# Patient Record
Sex: Female | Born: 1965 | Race: Black or African American | Hispanic: No | Marital: Married | Smoking: Never smoker
Health system: Southern US, Community
[De-identification: ages and names within clinical notes are randomized; demographics above are authoritative.]

## PROBLEM LIST (undated history)

## (undated) DIAGNOSIS — R011 Cardiac murmur, unspecified: Secondary | ICD-10-CM

## (undated) DIAGNOSIS — Z9221 Personal history of antineoplastic chemotherapy: Secondary | ICD-10-CM

## (undated) DIAGNOSIS — C50412 Malignant neoplasm of upper-outer quadrant of left female breast: Secondary | ICD-10-CM

## (undated) DIAGNOSIS — I10 Essential (primary) hypertension: Secondary | ICD-10-CM

## (undated) DIAGNOSIS — Z923 Personal history of irradiation: Secondary | ICD-10-CM

## (undated) HISTORY — PX: COLONOSCOPY W/ POLYPECTOMY: SHX1380

## (undated) HISTORY — DX: Malignant neoplasm of upper-outer quadrant of left female breast: C50.412

---

## 1991-05-14 HISTORY — PX: FOOT SURGERY: SHX648

## 1997-09-16 ENCOUNTER — Other Ambulatory Visit: Admission: RE | Admit: 1997-09-16 | Discharge: 1997-09-16 | Payer: Self-pay | Admitting: *Deleted

## 1999-11-28 ENCOUNTER — Other Ambulatory Visit: Admission: RE | Admit: 1999-11-28 | Discharge: 1999-11-28 | Payer: Self-pay | Admitting: *Deleted

## 2001-01-06 ENCOUNTER — Other Ambulatory Visit: Admission: RE | Admit: 2001-01-06 | Discharge: 2001-01-06 | Payer: Self-pay | Admitting: *Deleted

## 2002-03-03 ENCOUNTER — Encounter: Payer: Self-pay | Admitting: Obstetrics

## 2002-03-03 ENCOUNTER — Ambulatory Visit (HOSPITAL_COMMUNITY): Admission: RE | Admit: 2002-03-03 | Discharge: 2002-03-03 | Payer: Self-pay | Admitting: Obstetrics

## 2002-06-17 ENCOUNTER — Encounter: Payer: Self-pay | Admitting: Obstetrics

## 2002-06-17 ENCOUNTER — Ambulatory Visit (HOSPITAL_COMMUNITY): Admission: RE | Admit: 2002-06-17 | Discharge: 2002-06-17 | Payer: Self-pay | Admitting: Obstetrics

## 2002-06-18 ENCOUNTER — Inpatient Hospital Stay (HOSPITAL_COMMUNITY): Admission: AD | Admit: 2002-06-18 | Discharge: 2002-06-20 | Payer: Self-pay | Admitting: Obstetrics & Gynecology

## 2002-06-20 ENCOUNTER — Encounter (INDEPENDENT_AMBULATORY_CARE_PROVIDER_SITE_OTHER): Payer: Self-pay | Admitting: *Deleted

## 2005-02-04 ENCOUNTER — Inpatient Hospital Stay (HOSPITAL_COMMUNITY): Admission: AD | Admit: 2005-02-04 | Discharge: 2005-02-06 | Payer: Self-pay | Admitting: Obstetrics and Gynecology

## 2005-02-07 ENCOUNTER — Encounter: Admission: RE | Admit: 2005-02-07 | Discharge: 2005-03-08 | Payer: Self-pay | Admitting: Obstetrics and Gynecology

## 2005-03-09 ENCOUNTER — Encounter: Admission: RE | Admit: 2005-03-09 | Discharge: 2005-04-08 | Payer: Self-pay | Admitting: Obstetrics and Gynecology

## 2005-04-09 ENCOUNTER — Encounter: Admission: RE | Admit: 2005-04-09 | Discharge: 2005-05-08 | Payer: Self-pay | Admitting: Obstetrics and Gynecology

## 2005-05-09 ENCOUNTER — Encounter: Admission: RE | Admit: 2005-05-09 | Discharge: 2005-06-08 | Payer: Self-pay | Admitting: Obstetrics and Gynecology

## 2005-06-09 ENCOUNTER — Encounter: Admission: RE | Admit: 2005-06-09 | Discharge: 2005-07-09 | Payer: Self-pay | Admitting: Obstetrics and Gynecology

## 2005-07-10 ENCOUNTER — Encounter: Admission: RE | Admit: 2005-07-10 | Discharge: 2005-08-06 | Payer: Self-pay | Admitting: Obstetrics and Gynecology

## 2005-08-07 ENCOUNTER — Encounter: Admission: RE | Admit: 2005-08-07 | Discharge: 2005-09-06 | Payer: Self-pay | Admitting: Obstetrics and Gynecology

## 2005-09-07 ENCOUNTER — Encounter: Admission: RE | Admit: 2005-09-07 | Discharge: 2005-09-11 | Payer: Self-pay | Admitting: Obstetrics and Gynecology

## 2008-05-04 ENCOUNTER — Encounter: Admission: RE | Admit: 2008-05-04 | Discharge: 2008-05-04 | Payer: Self-pay | Admitting: Obstetrics and Gynecology

## 2010-06-03 ENCOUNTER — Encounter: Payer: Self-pay | Admitting: Obstetrics and Gynecology

## 2010-06-12 ENCOUNTER — Other Ambulatory Visit: Payer: Self-pay | Admitting: Obstetrics and Gynecology

## 2010-09-28 NOTE — H&P (Signed)
Emma [name], Emma [name]              ACCOUNT NO.:  [id number]   MEDICAL RECORD NO.:  [id number]                   PATIENT TYPE:  INP   LOCATION:  9171                                 FACILITY:  [facility]   PHYSICIAN:  Charles A. Clearance Coots, M.D.             DATE OF BIRTH:  23-Oct-1965   DATE OF ADMISSION:  06/18/2002  DATE OF DISCHARGE:                                HISTORY & PHYSICAL   HISTORY AND PHYSICAL:  A 45 year old black female, G2, P1-0-0-1 with last  menstrual period 01/03/02.  Estimated date of confinement of 10/11/02.  She  presented for regular prenatal care at the [facility] on 06/17/02,  and on examination with Doppler was found not to have a fetal heart rate.  Informal ultrasound was done, and fetal heart rate could not be identified.  The patient was sent for a formal ultrasound at the [facility]  [place], and fetal demise at [redacted] weeks gestation by ultrasound was  confirmed.  The patient presented on 06/18/02 for two stage cervical ripening  and induction of labor.   PAST MEDICAL HISTORY:  1. Cesarean section in 2/98 for placenta previa at term.  2. Illnesses - none.   MEDICATIONS:  Prenatal vitamins.   ALLERGIES:  No known drug allergies.   SOCIAL HISTORY:  Single.  Negative history of tobacco, alcohol, or  recreational drug use.   GYN HISTORY:  Menarche at age 55, every 28 day cycles, with a cycle length  of 5-7 days.  Negative history of sexually transmitted diseases.  Pap smears  have been normal.   OB HISTORY:  Per history of present illness.   PHYSICAL EXAMINATION:  GENERAL:  Well-nourished, well-developed, black  female in no acute distress.  VITAL SIGNS:  Afebrile.  Vital signs are stable.  HEENT:  Normal.  NECK:  Supple without adenopathy.  ABDOMEN:  Gravid, soft, nontender.  PELVIC:  Cervix long, closed.  Presenting part was high.   IMPRESSION:  A 22-week gestation by ultrasound with fetal demise.  Fetal  demise was confirmed  by official ultrasound at [facility]  [place].  The results were fully explained to the patient, and two stage  cervical ripening with induction of labor was recommended and agreed to by  the patient.   PLAN:  Two stage cervical ripening with induction of labor.                                               Charles A. Clearance Coots, M.D.    CAH/MEDQ  D:  06/18/2002  T:  06/18/2002  Job:  [id number]

## 2011-04-29 ENCOUNTER — Other Ambulatory Visit (HOSPITAL_COMMUNITY): Payer: Self-pay | Admitting: Obstetrics and Gynecology

## 2011-04-29 DIAGNOSIS — O2690 Pregnancy related conditions, unspecified, unspecified trimester: Secondary | ICD-10-CM

## 2011-04-30 ENCOUNTER — Ambulatory Visit (HOSPITAL_COMMUNITY)
Admission: RE | Admit: 2011-04-30 | Discharge: 2011-04-30 | Disposition: A | Discharge status: Home / Self Care | Payer: BC Managed Care – PPO | Source: Ambulatory Visit | Attending: Obstetrics and Gynecology | Admitting: Obstetrics and Gynecology

## 2011-04-30 DIAGNOSIS — O09529 Supervision of elderly multigravida, unspecified trimester: Secondary | ICD-10-CM | POA: Insufficient documentation

## 2011-04-30 DIAGNOSIS — O43899 Other placental disorders, unspecified trimester: Secondary | ICD-10-CM | POA: Insufficient documentation

## 2011-04-30 DIAGNOSIS — O2690 Pregnancy related conditions, unspecified, unspecified trimester: Secondary | ICD-10-CM

## 2011-05-01 ENCOUNTER — Ambulatory Visit (HOSPITAL_COMMUNITY): Payer: BC Managed Care – PPO | Admitting: Anesthesiology

## 2011-05-01 ENCOUNTER — Encounter (HOSPITAL_COMMUNITY): Payer: Self-pay | Admitting: Pharmacy Technician

## 2011-05-01 ENCOUNTER — Encounter (HOSPITAL_COMMUNITY): Payer: Self-pay | Admitting: Anesthesiology

## 2011-05-01 ENCOUNTER — Other Ambulatory Visit: Payer: Self-pay | Admitting: Obstetrics and Gynecology

## 2011-05-01 ENCOUNTER — Ambulatory Visit (HOSPITAL_COMMUNITY)
Admission: RE | Admit: 2011-05-01 | Discharge: 2011-05-01 | Disposition: A | Discharge status: Home / Self Care | Payer: BC Managed Care – PPO | Source: Ambulatory Visit | Attending: Obstetrics and Gynecology | Admitting: Obstetrics and Gynecology

## 2011-05-01 ENCOUNTER — Encounter (HOSPITAL_COMMUNITY)
Admission: RE | Disposition: A | Discharge status: Home / Self Care | Payer: Self-pay | Source: Ambulatory Visit | Attending: Obstetrics and Gynecology

## 2011-05-01 ENCOUNTER — Encounter (HOSPITAL_COMMUNITY): Payer: Self-pay | Admitting: *Deleted

## 2011-05-01 DIAGNOSIS — O020 Blighted ovum and nonhydatidiform mole: Secondary | ICD-10-CM

## 2011-05-01 DIAGNOSIS — O021 Missed abortion: Secondary | ICD-10-CM | POA: Insufficient documentation

## 2011-05-01 HISTORY — PX: DILATION AND EVACUATION: SHX1459

## 2011-05-01 HISTORY — DX: Essential (primary) hypertension: I10

## 2011-05-01 LAB — CBC
HCT: 37.4 % (ref 36.0–46.0)
MCV: 80.8 fL (ref 78.0–100.0)
Platelets: 248 10*3/uL (ref 150–400)
RBC: 4.63 MIL/uL (ref 3.87–5.11)
WBC: 11 10*3/uL — ABNORMAL HIGH (ref 4.0–10.5)

## 2011-05-01 LAB — HCG, QUANTITATIVE, PREGNANCY: hCG, Beta Chain, Quant, S: 252476 m[IU]/mL — ABNORMAL HIGH (ref <5)

## 2011-05-01 LAB — T4, FREE: Free T4: 1.72 ng/dL (ref 0.80–1.80)

## 2011-05-01 SURGERY — DILATION AND EVACUATION, UTERUS
Anesthesia: Choice

## 2011-05-01 MED ORDER — LIDOCAINE HCL 1 % IJ SOLN
INTRAMUSCULAR | Status: DC | PRN
  Administered 2011-05-01: 10 mL

## 2011-05-01 MED ORDER — LIDOCAINE HCL (CARDIAC) 20 MG/ML IV SOLN
INTRAVENOUS | Status: AC
  Filled 2011-05-01: qty 5

## 2011-05-01 MED ORDER — DEXAMETHASONE SODIUM PHOSPHATE 4 MG/ML IJ SOLN
INTRAMUSCULAR | Status: DC | PRN
  Administered 2011-05-01: 5 mg via INTRAVENOUS

## 2011-05-01 MED ORDER — MIDAZOLAM HCL 5 MG/5ML IJ SOLN
INTRAMUSCULAR | Status: DC | PRN
  Administered 2011-05-01: 2 mg via INTRAVENOUS

## 2011-05-01 MED ORDER — LIDOCAINE HCL (CARDIAC) 20 MG/ML IV SOLN
INTRAVENOUS | Status: DC | PRN
  Administered 2011-05-01: 80 mg via INTRAVENOUS

## 2011-05-01 MED ORDER — PROPOFOL 10 MG/ML IV EMUL
INTRAVENOUS | Status: DC | PRN
  Administered 2011-05-01: 200 mg via INTRAVENOUS

## 2011-05-01 MED ORDER — CEFAZOLIN SODIUM 1-5 GM-% IV SOLN
INTRAVENOUS | Status: AC
  Administered 2011-05-01: 1 g via INTRAVENOUS
  Filled 2011-05-01: qty 50

## 2011-05-01 MED ORDER — FENTANYL CITRATE 0.05 MG/ML IJ SOLN
INTRAMUSCULAR | Status: AC
  Filled 2011-05-01: qty 4

## 2011-05-01 MED ORDER — FENTANYL CITRATE 0.05 MG/ML IJ SOLN
INTRAMUSCULAR | Status: DC | PRN
  Administered 2011-05-01 (×4): 50 ug via INTRAVENOUS

## 2011-05-01 MED ORDER — MIDAZOLAM HCL 2 MG/2ML IJ SOLN
INTRAMUSCULAR | Status: AC
  Filled 2011-05-01: qty 2

## 2011-05-01 MED ORDER — LACTATED RINGERS IV SOLN
INTRAVENOUS | Status: DC
  Administered 2011-05-01 (×3): via INTRAVENOUS

## 2011-05-01 MED ORDER — DEXAMETHASONE SODIUM PHOSPHATE 10 MG/ML IJ SOLN
INTRAMUSCULAR | Status: AC
  Filled 2011-05-01: qty 1

## 2011-05-01 MED ORDER — ONDANSETRON HCL 4 MG/2ML IJ SOLN
INTRAMUSCULAR | Status: AC
  Filled 2011-05-01: qty 2

## 2011-05-01 MED ORDER — FENTANYL CITRATE 0.05 MG/ML IJ SOLN: 25.0000 ug | INTRAMUSCULAR | Status: DC | PRN

## 2011-05-01 MED ORDER — PROPOFOL 10 MG/ML IV EMUL
INTRAVENOUS | Status: AC
  Filled 2011-05-01: qty 20

## 2011-05-01 MED ORDER — ONDANSETRON HCL 4 MG/2ML IJ SOLN
INTRAMUSCULAR | Status: DC | PRN
  Administered 2011-05-01: 4 mg via INTRAVENOUS

## 2011-05-01 MED ORDER — HYDROCODONE-ACETAMINOPHEN 5-500 MG PO CAPS: 1.0000 | ORAL_CAPSULE | Freq: Four times a day (QID) | ORAL | Status: AC | PRN

## 2011-05-01 SURGICAL SUPPLY — 20 items
CATH ROBINSON RED A/P 16FR (CATHETERS) ×2 IMPLANT
CLOTH BEACON ORANGE TIMEOUT ST (SAFETY) ×2 IMPLANT
DECANTER SPIKE VIAL GLASS SM (MISCELLANEOUS) ×2 IMPLANT
GLOVE ECLIPSE 7.0 STRL STRAW (GLOVE) ×4 IMPLANT
GOWN PREVENTION PLUS LG XLONG (DISPOSABLE) ×2 IMPLANT
GOWN PREVENTION PLUS XLARGE (GOWN DISPOSABLE) ×2 IMPLANT
KIT BERKELEY 1ST TRIMESTER 3/8 (MISCELLANEOUS) ×2 IMPLANT
NDL SPNL 22GX3.5 QUINCKE BK (NEEDLE) ×1 IMPLANT
NEEDLE SPNL 22GX3.5 QUINCKE BK (NEEDLE) ×2 IMPLANT
NS IRRIG 1000ML POUR BTL (IV SOLUTION) ×2 IMPLANT
PACK VAGINAL MINOR WOMEN LF (CUSTOM PROCEDURE TRAY) ×2 IMPLANT
PAD PREP 24X48 CUFFED NSTRL (MISCELLANEOUS) ×2 IMPLANT
SET BERKELEY SUCTION TUBING (SUCTIONS) ×2 IMPLANT
SYR CONTROL 10ML LL (SYRINGE) ×2 IMPLANT
TOWEL OR 17X24 6PK STRL BLUE (TOWEL DISPOSABLE) ×4 IMPLANT
VACURETTE 10 RIGID CVD (CANNULA) IMPLANT
VACURETTE 12 RIGID CVD (CANNULA) ×1 IMPLANT
VACURETTE 7MM CVD STRL WRAP (CANNULA) IMPLANT
VACURETTE 8 RIGID CVD (CANNULA) IMPLANT
VACURETTE 9 RIGID CVD (CANNULA) IMPLANT

## 2011-05-01 NOTE — H&P (Signed)
Pt is a 35 year black female G4 P2012 who presents to OR for D & C secondary to a molar pregnancy.  Pt initially had an u/s in [place]. The diagnosis was confirmed by Dr. Sherrie George.   PE- VSSAF        HEENT- wnl        Abd- soft, nontender,         Pelvic- deferred to or. IMP/ Molar pregnancy Plan/ D& C           Check Quant Bhcg, CBC, TSH, ABO

## 2011-05-01 NOTE — Preoperative (Signed)
Beta Blockers   Reason not to administer Beta Blockers:Not Applicable 

## 2011-05-01 NOTE — Progress Notes (Signed)
Dr. Sherron Ales aware of pt low HR.  Pt denies any dizziness or nausea at this time.  No new orders received.

## 2011-05-01 NOTE — Transfer of Care (Signed)
Immediate Anesthesia Transfer of Care Note  Patient: Emma [name]  Procedure(s) Performed:  DILATATION AND EVACUATION  Patient Location: PACU  Anesthesia Type: General  Level of Consciousness: awake, alert , oriented and patient cooperative  Airway & Oxygen Therapy: Patient Spontanous Breathing and Patient connected to nasal cannula oxygen  Post-op Assessment: Report given to PACU RN and Post -op Vital signs reviewed and stable  Post vital signs: Reviewed and stable  Complications: No apparent anesthesia complications

## 2011-05-01 NOTE — Anesthesia Postprocedure Evaluation (Signed)
  Anesthesia Post-op Note  Patient: Emma [name]  Procedure(s) Performed:  DILATATION AND EVACUATION  Patient is awake and responsive. Pain and nausea are reasonably well controlled. Vital signs are stable and clinically acceptable. Oxygen saturation is clinically acceptable. There are no apparent anesthetic complications at this time. Patient is ready for discharge.

## 2011-05-01 NOTE — Anesthesia Preprocedure Evaluation (Signed)
Anesthesia Evaluation  Patient identified by MRN, date of birth, ID band Patient awake    Reviewed: Allergy & Precautions, H&P , Patient's Chart, lab work & pertinent test results, reviewed documented beta blocker date and time   Airway Mallampati: II TM Distance: >3 FB Neck ROM: full    Dental No notable dental hx.    Pulmonary  clear to auscultation  Pulmonary exam normal       Cardiovascular regular Normal    Neuro/Psych    GI/Hepatic   Endo/Other    Renal/GU      Musculoskeletal   Abdominal   Peds  Hematology   Anesthesia Other Findings   Reproductive/Obstetrics                           Anesthesia Physical Anesthesia Plan  ASA: II  Anesthesia Plan: General   Post-op Pain Management:    Induction: Intravenous  Airway Management Planned: LMA  Additional Equipment:   Intra-op Plan:   Post-operative Plan:   Informed Consent: I have reviewed the patients History and Physical, chart, labs and discussed the procedure including the risks, benefits and alternatives for the proposed anesthesia with the patient or authorized representative who has indicated his/her understanding and acceptance.   Dental Advisory Given  Plan Discussed with: CRNA and Surgeon  Anesthesia Plan Comments: (  Discussed  general anesthesia, including possible nausea, instrumentation of airway, sore throat,pulmonary aspiration, etc. I asked if the were any outstanding questions, or  concerns before we proceeded. )        Anesthesia Quick Evaluation  

## 2011-05-02 ENCOUNTER — Encounter (HOSPITAL_COMMUNITY): Payer: Self-pay | Admitting: Obstetrics and Gynecology

## 2011-05-02 NOTE — Op Note (Signed)
NAMEMarland [name]  JOYOUS, GLEGHORN NO.:  [id number]  MEDICAL RECORD NO.:  [id number]  LOCATION:  [facility]                          FACILITY:  [facility]  PHYSICIAN:  Malva Limes, M.D.    DATE OF BIRTH:  [birth year]  DATE OF PROCEDURE:  05/01/2011 DATE OF DISCHARGE:  05/01/2011                              OPERATIVE REPORT   PREOPERATIVE DIAGNOSIS:  Molar pregnancy.  POSTOPERATIVE DIAGNOSIS:  Molar pregnancy.  PROCEDURE:  Dilation and evacuation.  SURGEON:  Malva Limes, MD  ANESTHESIA:  General with paracervical block.  DRAINS:  Red rubber catheter bladder.  SPECIMENS:  Endometrial curettings sent to Pathology.  COMPLICATIONS:  None.  ESTIMATED BLOOD LOSS:  100 mL.  PROCEDURE IN DETAIL:  The patient was taken to the operating room, where she was placed in dorsal supine position.  A general anesthetic was administered without difficulty.  She was placed in dorsal lithotomy position.  She was prepped and draped in the usual fashion for this procedure.  An exam under anesthesia revealed the uterus approximately 12 weeks in size.  There were no adnexal masses.  The patient had a sterile speculum placed in the vagina, 20 mL of 1% lidocaine was used for paracervical block.  The cervix was serially dilated to a 33-French and a 12 mm suction cannula was placed into the uterine cavity. Products of conception withdrawn.  Sharp curettage was performed followed by repeat suction.  Following this, the patient had minimal bleeding.  She was awakened and taken to recovery room in stable condition.  She will be discharged to home.  She will be sent home with Advil and Vicodin to take p.r.n.  She will follow up in the office in 5 days.  Prior to the procedure, she did have a quantitative beta HCG drawn.  She will be given RhoGAM if Rh negative.          ______________________________ Malva Limes, M.D.    MA/MEDQ  D:  05/01/2011  T:  05/02/2011  Job:  [id number]

## 2013-06-10 ENCOUNTER — Other Ambulatory Visit: Payer: Self-pay | Admitting: Obstetrics and Gynecology

## 2014-06-13 ENCOUNTER — Other Ambulatory Visit: Payer: Self-pay | Admitting: Obstetrics and Gynecology

## 2014-06-14 LAB — CYTOLOGY - PAP

## 2014-08-04 ENCOUNTER — Other Ambulatory Visit: Payer: Self-pay | Admitting: Gastroenterology

## 2015-05-14 DIAGNOSIS — Z9221 Personal history of antineoplastic chemotherapy: Secondary | ICD-10-CM

## 2015-05-14 DIAGNOSIS — Z923 Personal history of irradiation: Secondary | ICD-10-CM

## 2015-05-14 HISTORY — DX: Personal history of antineoplastic chemotherapy: Z92.21

## 2015-05-14 HISTORY — DX: Personal history of irradiation: Z92.3

## 2015-06-19 ENCOUNTER — Other Ambulatory Visit: Payer: Self-pay | Admitting: Obstetrics and Gynecology

## 2015-06-20 LAB — CYTOLOGY - PAP

## 2015-06-23 ENCOUNTER — Other Ambulatory Visit: Payer: Self-pay | Admitting: Obstetrics and Gynecology

## 2015-06-23 DIAGNOSIS — R928 Other abnormal and inconclusive findings on diagnostic imaging of breast: Secondary | ICD-10-CM

## 2015-07-03 ENCOUNTER — Ambulatory Visit
Admission: RE | Admit: 2015-07-03 | Discharge: 2015-07-03 | Disposition: A | Discharge status: Home / Self Care | Payer: BC Managed Care – PPO | Source: Ambulatory Visit | Attending: Obstetrics and Gynecology | Admitting: Obstetrics and Gynecology

## 2015-07-03 ENCOUNTER — Other Ambulatory Visit: Payer: Self-pay | Admitting: Obstetrics and Gynecology

## 2015-07-03 DIAGNOSIS — N6320 Unspecified lump in the left breast, unspecified quadrant: Secondary | ICD-10-CM

## 2015-07-03 DIAGNOSIS — R928 Other abnormal and inconclusive findings on diagnostic imaging of breast: Secondary | ICD-10-CM

## 2015-07-03 DIAGNOSIS — R2232 Localized swelling, mass and lump, left upper limb: Secondary | ICD-10-CM

## 2015-07-10 ENCOUNTER — Ambulatory Visit
Admission: RE | Admit: 2015-07-10 | Discharge: 2015-07-10 | Disposition: A | Discharge status: Home / Self Care | Payer: BC Managed Care – PPO | Source: Ambulatory Visit | Attending: Obstetrics and Gynecology | Admitting: Obstetrics and Gynecology

## 2015-07-10 ENCOUNTER — Other Ambulatory Visit: Payer: Self-pay | Admitting: Obstetrics and Gynecology

## 2015-07-10 DIAGNOSIS — N6320 Unspecified lump in the left breast, unspecified quadrant: Secondary | ICD-10-CM

## 2015-07-10 DIAGNOSIS — R2232 Localized swelling, mass and lump, left upper limb: Secondary | ICD-10-CM

## 2015-07-10 HISTORY — PX: BREAST BIOPSY: SHX20

## 2015-07-13 ENCOUNTER — Encounter: Payer: Self-pay | Admitting: *Deleted

## 2015-07-13 ENCOUNTER — Telehealth: Payer: Self-pay | Admitting: *Deleted

## 2015-07-13 DIAGNOSIS — Z170 Estrogen receptor positive status [ER+]: Secondary | ICD-10-CM | POA: Insufficient documentation

## 2015-07-13 DIAGNOSIS — C50412 Malignant neoplasm of upper-outer quadrant of left female breast: Secondary | ICD-10-CM

## 2015-07-13 HISTORY — DX: Malignant neoplasm of upper-outer quadrant of left female breast: C50.412

## 2015-07-13 NOTE — Telephone Encounter (Signed)
Confirmed [facility] for 07/19/15 at 0815 .  Instructions and contact information given.

## 2015-07-13 NOTE — Telephone Encounter (Signed)
Left message for a return phone call.

## 2015-07-17 ENCOUNTER — Telehealth: Payer: Self-pay | Admitting: *Deleted

## 2015-07-17 NOTE — Telephone Encounter (Signed)
Mailed clinic packet to pt.  

## 2015-07-19 ENCOUNTER — Encounter: Payer: Self-pay | Admitting: *Deleted

## 2015-07-19 ENCOUNTER — Encounter: Payer: Self-pay | Admitting: Physical Therapy

## 2015-07-19 ENCOUNTER — Encounter: Payer: Self-pay | Admitting: Skilled Nursing Facility1

## 2015-07-19 ENCOUNTER — Other Ambulatory Visit: Payer: Self-pay | Admitting: Surgery

## 2015-07-19 ENCOUNTER — Other Ambulatory Visit (HOSPITAL_BASED_OUTPATIENT_CLINIC_OR_DEPARTMENT_OTHER): Payer: BC Managed Care – PPO

## 2015-07-19 ENCOUNTER — Telehealth: Payer: Self-pay | Admitting: Oncology

## 2015-07-19 ENCOUNTER — Encounter: Payer: Self-pay | Admitting: Oncology

## 2015-07-19 ENCOUNTER — Other Ambulatory Visit: Payer: Self-pay | Admitting: *Deleted

## 2015-07-19 ENCOUNTER — Ambulatory Visit (HOSPITAL_BASED_OUTPATIENT_CLINIC_OR_DEPARTMENT_OTHER): Payer: BC Managed Care – PPO | Admitting: Oncology

## 2015-07-19 ENCOUNTER — Ambulatory Visit: Payer: BC Managed Care – PPO | Attending: Surgery | Admitting: Physical Therapy

## 2015-07-19 ENCOUNTER — Ambulatory Visit
Admission: RE | Admit: 2015-07-19 | Discharge: 2015-07-19 | Disposition: A | Discharge status: Home / Self Care | Payer: BC Managed Care – PPO | Source: Ambulatory Visit | Attending: Radiation Oncology | Admitting: Radiation Oncology

## 2015-07-19 ENCOUNTER — Encounter: Payer: Self-pay | Admitting: Nurse Practitioner

## 2015-07-19 VITALS — BP 150/76 | HR 104 | Temp 98.5°F | Resp 18 | Ht 65.0 in | Wt 211.4 lb

## 2015-07-19 DIAGNOSIS — C50412 Malignant neoplasm of upper-outer quadrant of left female breast: Secondary | ICD-10-CM

## 2015-07-19 DIAGNOSIS — Z170 Estrogen receptor positive status [ER+]: Secondary | ICD-10-CM

## 2015-07-19 DIAGNOSIS — C773 Secondary and unspecified malignant neoplasm of axilla and upper limb lymph nodes: Secondary | ICD-10-CM | POA: Diagnosis not present

## 2015-07-19 LAB — CBC WITH DIFFERENTIAL/PLATELET
BASO%: 0.2 % (ref 0.0–2.0)
Basophils Absolute: 0 10*3/uL (ref 0.0–0.1)
EOS ABS: 0.1 10*3/uL (ref 0.0–0.5)
EOS%: 0.7 % (ref 0.0–7.0)
HCT: 37.4 % (ref 34.8–46.6)
HEMOGLOBIN: 11.7 g/dL (ref 11.6–15.9)
LYMPH%: 48 % (ref 14.0–49.7)
MCH: 24.1 pg — ABNORMAL LOW (ref 25.1–34.0)
MCHC: 31.3 g/dL — ABNORMAL LOW (ref 31.5–36.0)
MCV: 77.1 fL — AB (ref 79.5–101.0)
MONO#: 0.6 10*3/uL (ref 0.1–0.9)
MONO%: 5.6 % (ref 0.0–14.0)
NEUT%: 45.5 % (ref 38.4–76.8)
NEUTROS ABS: 5.2 10*3/uL (ref 1.5–6.5)
PLATELETS: 326 10*3/uL (ref 145–400)
RBC: 4.85 10*6/uL (ref 3.70–5.45)
RDW: 17.5 % — ABNORMAL HIGH (ref 11.2–14.5)
WBC: 11.4 10*3/uL — AB (ref 3.9–10.3)
lymph#: 5.5 10*3/uL — ABNORMAL HIGH (ref 0.9–3.3)

## 2015-07-19 LAB — COMPREHENSIVE METABOLIC PANEL
ALBUMIN: 3.7 g/dL (ref 3.5–5.0)
ALK PHOS: 61 U/L (ref 40–150)
ALT: <9 U/L (ref 0–55)
ANION GAP: 9 meq/L (ref 3–11)
AST: 14 U/L (ref 5–34)
BILIRUBIN TOTAL: 0.49 mg/dL (ref 0.20–1.20)
BUN: 12.1 mg/dL (ref 7.0–26.0)
CALCIUM: 9.1 mg/dL (ref 8.4–10.4)
CO2: 23 mEq/L (ref 22–29)
CREATININE: 0.9 mg/dL (ref 0.6–1.1)
Chloride: 108 mEq/L (ref 98–109)
EGFR: 85 mL/min/{1.73_m2} — AB (ref >90)
Glucose: 100 mg/dl (ref 70–140)
Potassium: 3.9 mEq/L (ref 3.5–5.1)
Sodium: 141 mEq/L (ref 136–145)
TOTAL PROTEIN: 7.8 g/dL (ref 6.4–8.3)

## 2015-07-19 LAB — TECHNOLOGIST REVIEW

## 2015-07-19 NOTE — Progress Notes (Signed)
Radiation Oncology         [phone]) [phone] ________________________________  Initial Outpatient Consultation  Name: Emma [name] MRN: [id number]  Date: 07/19/2015  DOB: 1966-02-06  KV:[id number],VFIE E, MD  [name], MD   REFERRING PHYSICIAN: [name], MD  DIAGNOSIS: The encounter diagnosis was Breast cancer of upper-outer quadrant of left female breast [facility]).  Clinical stage II (T2N1, MX) invasive lobular carcinoma of the left breast  HISTORY OF PRESENT ILLNESS::Emma [name] is a 50 y.o. female who had a screening mammogram at the [facility] on 06/27/2015. This revealed a possible mass in the left breast. Mammogram on 07/03/2015 confirmed a 4 cm area of distortion in the left breast and a possible prominent left axillary lymph node. Targeted ultrasound showed a 2.3 x 2.1 x 2.6 cm irregular hypoechoic mass at the 2 o'clock position 12 cm from the nipple in the left breast. At the 2:30 o'clock position 12-14 cm from the nipple, another irregular hypoechoic mass measuring 5 x 5 x 5 mm was noted. In the left axilla, a 9 mm lymph node with a thickened cortex measuring 4 mm and partially effaced fatty hilum was noted. There was some vascular flow within a portion of the cortex.  Biopsies were performed on 07/10/2015. Biopsy of the left breast at the 2:30 o'clock position 14 cm from the nipple revealed grade 1 invasive mammary carcinoma with mammary carcinoma in situ (ER positive 90%, PR positive 100%, HER2 negative, Ki67 5%), biopsy of the left breast at the 2 o'clock position 12 cm from the nipple revealed grade 1 invasive mammary carcinoma (ER positive 100%, PR positive 100%, HER2 negative, Ki67 20%), and biopsy of the left axillary lymph node was positive for metastatic mammary carcinoma (ER positive 70%, PR positive 90%, HER2 negative, Ki67 10%).  The patient presents to multidisciplinary breast clinic and my recommendation of radiotherapy for the management  of her disease.  PREVIOUS RADIATION THERAPY: No  PAST MEDICAL HISTORY:  has a past medical history of Hypertension and Breast cancer of upper-outer quadrant of left female breast (HCC) (07/13/2015).    Gynecologic History  Age at first menstrual period? 11  Are you still having periods? Yes  If you are still having periods: Are your periods regular? Yes Obstetric History:  How many children have you carried to term? 2 Your age at first live birth? 30  Pregnant now or trying to get pregnant? No  Have you used birth control pills or hormone shots for contraception? Yes Health Maintenance:  Have you ever had a colonoscopy? Yes If yes, date? Jan 2016  Have you ever had a bone density? No  Date of your last PAP smear? Feb 6 Date of your FIRST mammogram? Age 77  PAST SURGICAL HISTORY: Past Surgical History  Procedure Laterality Date  . Cesarean section  1998  . Foot surgery  1993    bunionectomy  . Dilation and evacuation  05/01/2011    Procedure: DILATATION AND EVACUATION;  Surgeon: Levi Aland;  Location: [facility];  Service: Gynecology;  Laterality: N/A;    FAMILY HISTORY: family history includes Colon cancer in her mother.  SOCIAL HISTORY:  reports that she has never smoked. She does not have any smokeless tobacco history on file. She reports that she does not drink alcohol or use illicit drugs. Accompanied by her husband on evaluation today. The patient currently lives in the [place] area.  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  ALLERGIES: Review of patient's allergies indicates no known allergies.  MEDICATIONS:  Current Outpatient Prescriptions  Medication Sig Dispense Refill  . amLODipine (NORVASC) 5 MG tablet Take 5 mg by mouth every morning.      . Vitamin D, Ergocalciferol, (DRISDOL) 50000 UNITS CAPS Take 50,000 Units by mouth every 7 (seven) days. Takes every Sunday      No current facility-administered medications for this encounter.     REVIEW OF SYSTEMS:  A 15 point review of systems is documented in the electronic medical record. This was obtained by the nursing staff. However, I reviewed this with the patient to discuss relevant findings and make appropriate changes.  Pertinent items noted in HPI and remainder of comprehensive ROS otherwise negative.   She has no other complaints at this time.  PHYSICAL EXAM:  vitals were not taken for this visit.  Vitals with BMI 07/19/2015  Height 5\' 5"   Weight 211 lbs 6 oz  BMI 35.3  Systolic 150  Diastolic 76  Pulse 104  Respirations 18   General: Alert and oriented, in no acute distress HEENT: Head is normocephalic. Extraocular movements are intact. Oropharynx is clear. Neck: Neck is supple, no palpable cervical or supraclavicular lymphadenopathy. Heart: Regular in rate and rhythm with no murmurs, rubs, or gallops. Chest: Clear to auscultation bilaterally, with no rhonchi, wheezes, or rales. Abdomen: Soft, nontender, nondistended, with no rigidity or guarding. Extremities: No cyanosis or edema. Lymphatics: see Neck Exam Breast: Thickening in the upper outer aspect of the left breast.  No discrete palpable mass appreciated. Skin: No concerning lesions. Musculoskeletal: symmetric strength and muscle tone throughout. Neurologic: Cranial nerves II through XII are grossly intact. No obvious focalities. Speech is fluent. Coordination is intact. Psychiatric: Judgment and insight are intact. Affect is appropriate.  ECOG = 1   LABORATORY DATA:  Lab Results  Component Value Date   WBC 11.4* 07/19/2015   HGB 11.7 07/19/2015   HCT 37.4 07/19/2015   MCV 77.1* 07/19/2015   PLT 326 07/19/2015   NEUTROABS 5.2 07/19/2015   Lab Results  Component Value Date   NA 141 07/19/2015   K 3.9 07/19/2015   CO2 23 07/19/2015   GLUCOSE 100 07/19/2015   CREATININE 0.9 07/19/2015   CALCIUM 9.1 07/19/2015      RADIOGRAPHY: Mm Digital Diagnostic Unilat L  07/10/2015  CLINICAL DATA:   Post left breast ultrasound-guided core biopsies and left axillary lymph node ultrasound-guided core biopsy. EXAM: DIAGNOSTIC LEFT MAMMOGRAM POST ULTRASOUND BIOPSY COMPARISON:  Previous exam(s). FINDINGS: Mammographic images were obtained following ultrasound guided biopsy of the palpable mass located within the left breast at the 2 o'clock position 12 cm from the nipple, a small satellite nodule located within the left breast at the 2:30 o'clock position 14 cm from the nipple, and a prominent level 1 left axillary lymph node. The coil shaped clip is in appropriate position associated with the palpable mass located at the 2 o'clock position 12 cm from the nipple. The ribbon shaped clip is in appropriate position associated with the small satellite nodule. The spiral shaped HydroMARK clip is in appropriate position associated with the level 1 left axillary lymph node. IMPRESSION: Appropriate positioning of clips following left breast and axillary lymph node ultrasound-guided biopsies as discussed. Final Assessment: Post Procedure Mammograms for Marker Placement Electronically Signed   By: Rolla Plate M.D.   On: 07/10/2015 16:50   US Breast Ltd Uni Left Inc Axilla  07/17/2015  ADDENDUM REPORT: 07/17/2015 11:02 ADDENDUM: This addendum  is created to correct a left/right error in the original report. The final paragraph of the FINDINGS section should read as follows: In the LEFT axilla, a lymph node with a thickened cortex and partially effaced fatty hilum is imaged. This lymph node measures 9 mm short axis, and the cortex measures 4 mm. There is some vascular flow within a portion of the cortex. Electronically Signed   By: Britta Mccreedy M.D.   On: 07/17/2015 11:02  07/17/2015  CLINICAL DATA:  Possible distortion left breast identified on recent screening mammogram. EXAM: DIGITAL DIAGNOSTIC LEFT MAMMOGRAM WITH 3D TOMOSYNTHESIS WITH CAD ULTRASOUND LEFT BREAST COMPARISON:  06/19/2015 ACR Breast Density Category b:  There are scattered areas of fibroglandular density. FINDINGS: 3D tomographic images confirm an area of distortion in the far outer left breast. On mammography, the area of distortion measures approximately 4 cm greatest diameter. Possible prominent left axillary lymph node noted. Mammographic images were processed with CAD. On physical exam, there is an approximately 4 cm area of palpable firmness in the 2 o'clock position of the left breast approximately 12 cm from the nipple. No mass is palpated in the left axilla. Targeted ultrasound is performed, showing an irregular hypoechoic mass with indistinct margins and posterior acoustic shadowing at 2 o'clock position 12 cm from the nipple. On ultrasound, this area measures approximately 2.3 x 2.1 x 2.6 cm. Further lateral and closer to the axilla, at 2:30 position approximately 12-14 cm from the nipple, is an unsuspected irregular hypoechoic mass measuring 5 x 5 x 5 mm. It is difficult to image this small mass simultaneously on the ultrasound screen as the larger palpable mass. In the right axilla, a lymph node with a thickened cortex and partially effaced fatty hilum is imaged. This lymph node measures 9 mm short axis, and the cortex measures 4 mm. There is some vascular flow within a portion of the cortex. IMPRESSION: 1. Persistent area of architectural distortion and mass in the 2:30 position left breast approximately 12 cm from the nipple. On mammography the abnormality measures up to 4 cm, and on ultrasound it measures 2.6 cm. Findings are suspicious for malignancy. 2. Separate 5 mm suspicious hypoechoic mass at 2:30 position approximately 12-14 cm from the nipple. 3. Cortical thickening of the left axillary lymph node. This could be reactive, or could reflect metastatic involvement. : A total of 3 ultrasound-guided biopsies are recommended, including 2 masses in the upper-outer quadrant of the left breast and a single left axillary lymph node. Biopsies have been  scheduled 08/13/2015 at 3 o'clock p.m. I have discussed the findings and recommendations with the patient. Results were also provided in writing at the conclusion of the visit. If applicable, a reminder letter will be sent to the patient regarding the next appointment. BI-RADS CATEGORY  5: Highly suggestive of malignancy. Electronically Signed: By: Britta Mccreedy M.D. On: 07/03/2015 15:16   Mm Diag Breast Tomo Uni Left  07/17/2015  ADDENDUM REPORT: 07/17/2015 11:02 ADDENDUM: This addendum is created to correct a left/right error in the original report. The final paragraph of the FINDINGS section should read as follows: In the LEFT axilla, a lymph node with a thickened cortex and partially effaced fatty hilum is imaged. This lymph node measures 9 mm short axis, and the cortex measures 4 mm. There is some vascular flow within a portion of the cortex. Electronically Signed   By: Britta Mccreedy M.D.   On: 07/17/2015 11:02  07/17/2015  CLINICAL DATA:  Possible distortion left  breast identified on recent screening mammogram. EXAM: DIGITAL DIAGNOSTIC LEFT MAMMOGRAM WITH 3D TOMOSYNTHESIS WITH CAD ULTRASOUND LEFT BREAST COMPARISON:  06/19/2015 ACR Breast Density Category b: There are scattered areas of fibroglandular density. FINDINGS: 3D tomographic images confirm an area of distortion in the far outer left breast. On mammography, the area of distortion measures approximately 4 cm greatest diameter. Possible prominent left axillary lymph node noted. Mammographic images were processed with CAD. On physical exam, there is an approximately 4 cm area of palpable firmness in the 2 o'clock position of the left breast approximately 12 cm from the nipple. No mass is palpated in the left axilla. Targeted ultrasound is performed, showing an irregular hypoechoic mass with indistinct margins and posterior acoustic shadowing at 2 o'clock position 12 cm from the nipple. On ultrasound, this area measures approximately 2.3 x 2.1 x 2.6 cm.  Further lateral and closer to the axilla, at 2:30 position approximately 12-14 cm from the nipple, is an unsuspected irregular hypoechoic mass measuring 5 x 5 x 5 mm. It is difficult to image this small mass simultaneously on the ultrasound screen as the larger palpable mass. In the right axilla, a lymph node with a thickened cortex and partially effaced fatty hilum is imaged. This lymph node measures 9 mm short axis, and the cortex measures 4 mm. There is some vascular flow within a portion of the cortex. IMPRESSION: 1. Persistent area of architectural distortion and mass in the 2:30 position left breast approximately 12 cm from the nipple. On mammography the abnormality measures up to 4 cm, and on ultrasound it measures 2.6 cm. Findings are suspicious for malignancy. 2. Separate 5 mm suspicious hypoechoic mass at 2:30 position approximately 12-14 cm from the nipple. 3. Cortical thickening of the left axillary lymph node. This could be reactive, or could reflect metastatic involvement. : A total of 3 ultrasound-guided biopsies are recommended, including 2 masses in the upper-outer quadrant of the left breast and a single left axillary lymph node. Biopsies have been scheduled 08/13/2015 at 3 o'clock p.m. I have discussed the findings and recommendations with the patient. Results were also provided in writing at the conclusion of the visit. If applicable, a reminder letter will be sent to the patient regarding the next appointment. BI-RADS CATEGORY  5: Highly suggestive of malignancy. Electronically Signed: By: Britta Mccreedy M.D. 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 GRADE I INVASIVE MAMMARY CARCINOMA of the Left breast at the 2:00 o'clock location. ONE LYMPH NODE POSITIVE FOR METASTATIC  MAMMARY CARCINOMA from the LEFT axilla. This was found to be concordant by Dr. Anselmo Pickler. Pathology results were discussed with the patient by telephone. The patient reported doing well after the biopsy. Post biopsy instructions and care were reviewed and questions were answered. The patient was encouraged to call [facility] for any additional concerns. The patient was referred to [facility] on July 19, 2015. Pathology results reported by Rene Kocher, RN on 07/12/2015. Electronically Signed   By: Rolla Plate M.D.   On: 07/12/2015 08:10  07/12/2015  CLINICAL DATA:  Palpable left breast mass with possible satellite nodule and prominent left axillary lymph node. EXAM: ULTRASOUND GUIDED LEFT BREAST CORE  NEEDLE BIOPSY COMPARISON:  Previous exam(s). FINDINGS: I met with the patient and we discussed the procedure of ultrasound-guided biopsy, including benefits and alternatives. We discussed the high likelihood of a successful procedure. We discussed the risks of the procedure, including infection, bleeding, tissue injury, clip migration, and inadequate sampling. Informed written consent was given. The usual time-out protocol was performed immediately prior to the procedure. Using sterile technique and 1% Lidocaine as local anesthetic, under direct ultrasound visualization, a 14 gauge spring-loaded device was used to perform biopsy of these small hypoechoic mass (possible satellite nodule) located within the left breast at the 2:30 o'clock position 14 cm from the nipple using a lateral approach. This small hypoechoic nodule is located approximately 1.2 cm superior and slightly lateral to the palpable mass. At the conclusion of the procedure a ribbon shaped tissue marker clip was deployed into the biopsy cavity. Follow up 2 view mammogram was performed and dictated separately. IMPRESSION: Ultrasound guided  biopsy of the small possible satellite nodule located within the left breast at the 2:30 o'clock position 14 cm from the nipple. No apparent complications. Electronically Signed: By: Rolla Plate M.D. 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 GRADE I INVASIVE MAMMARY CARCINOMA of the Left breast at the 2:00 o'clock location. ONE LYMPH NODE POSITIVE FOR METASTATIC MAMMARY CARCINOMA from the LEFT axilla. This was found to be concordant by Dr. Anselmo Pickler. Pathology results were discussed with the patient by telephone. The patient reported doing well after the biopsy. Post biopsy instructions and care were reviewed and questions were answered. The patient was encouraged to call [facility] for any additional concerns. The patient was referred to [facility] at [facility] on July 19, 2015. Pathology results reported by Rene Kocher, RN on 07/12/2015. Electronically Signed   By: Rolla Plate M.D.   On: 07/12/2015 08:11  07/12/2015  CLINICAL DATA:  Palpable left breast mass with possible small satellite nodule and prominent left axillary lymph node. EXAM: ULTRASOUND GUIDED LEFT AXILLARY LYMPH NODE CORE NEEDLE BIOPSY COMPARISON:  Previous exam(s). FINDINGS: I met with the patient and we discussed the procedure of ultrasound-guided biopsy, including benefits and alternatives. We discussed the high likelihood of a successful procedure. We discussed the risks of the procedure, including infection, bleeding, tissue injury, clip migration, and inadequate sampling. Informed written consent was given. The usual time-out protocol was performed immediately prior to the procedure. Using sterile technique and  1% Lidocaine as local anesthetic, under direct ultrasound visualization, a 14 gauge spring-loaded device was used to perform biopsy of the prominent level 1 left axillary lymph node using a lateral approach. At the conclusion of the procedure a spiral shaped HydroMARK tissue marker clip was deployed into the biopsy cavity. Follow up 2 view mammogram was performed and dictated separately. IMPRESSION: Ultrasound guided biopsy of the mildly enlarged level 1 left axillary lymph node as discussed. No apparent complications. Electronically Signed: By: Rolla Plate M.D. On: 07/10/2015 16:48   [place] Lt Breast Bx W Loc Dev Ea Add Lesion Img Bx Spec US Guide  07/12/2015  ADDENDUM REPORT: 07/12/2015 08:12 ADDENDUM: Pathology revealed GRADE I INVASIVE MAMMARY CARCINOMA, MAMMARY CARCINOMA IN SITU of the Left breast at  the 2:30 o'clock location. GRADE I INVASIVE MAMMARY CARCINOMA of the Left breast at the 2:00 o'clock location. ONE LYMPH NODE POSITIVE FOR METASTATIC MAMMARY CARCINOMA from the LEFT axilla. This was found to be concordant by Dr. Anselmo Pickler. Pathology results were discussed with the patient by telephone. The patient reported doing well after the biopsy. Post biopsy instructions and care were reviewed and questions were answered. The patient was encouraged to call The [facility] for any additional concerns. The patient was referred to [facility] at [facility] on July 19, 2015. Pathology results reported by Rene Kocher, RN on 07/12/2015. Electronically Signed   By: Rolla Plate M.D.   On: 07/12/2015 08:12  07/12/2015  CLINICAL DATA:  Palpable left breast mass with possible small satellite nodule and prominent left axillary lymph node. EXAM: ULTRASOUND GUIDED LEFT BREAST CORE NEEDLE BIOPSY COMPARISON:  Previous exam(s). FINDINGS: I met with the patient and we discussed the procedure of ultrasound-guided biopsy, including  benefits and alternatives. We discussed the high likelihood of a successful procedure. We discussed the risks of the procedure, including infection, bleeding, tissue injury, clip migration, and inadequate sampling. Informed written consent was given. The usual time-out protocol was performed immediately prior to the procedure. Using sterile technique and 1% Lidocaine as local anesthetic, under direct ultrasound visualization, a 14 gauge spring-loaded device was used to perform biopsy of the palpable mass located within the left breast at the 2 o'clock position 12 cm from the nipple using a lateral approach. At the conclusion of the procedure a coil shaped tissue marker clip was deployed into the biopsy cavity. Follow up 2 view mammogram was performed and dictated separately. IMPRESSION: Ultrasound guided biopsy of the palpable left breast mass located the 2 o'clock position 12 cm from the nipple as discussed above. No apparent complications. Electronically Signed: By: Rolla Plate M.D. On: 07/10/2015 16:43      IMPRESSION: Clinical stage II (T2N1, MX) invasive lobular carcinoma of the left breast. The patient would be a candidate for radiation with either surgical approach given her lymph node involvement. I provided a brief explanation of the process of radiation therapy.  PLAN: The patient was scheduled for a bilateral breast MRI on 3/10. She will be referred to a CT scan and bone scan. She will be be referred to chemotherapy class, be placed with a port-a-cath, and receive neoadjuvant chemotherapy. Surgery approach to be decided at a later date and it is questionable if she is a candidate for the [place]. Afterwards, she would receive radiation and be placed on an aromatase inhibitor. She lives in the [place] area and would likely receive radiation in [place].     ------------------------------------------------  Billie Lade, PhD, MD  This document serves as a record of services  personally performed by Antony Blackbird, MD. It was created on his behalf by Eustace Moore, a trained medical scribe. The creation of this record is based on the scribe's personal observations and the provider's statements to them. This document has been checked and approved by the attending provider.

## 2015-07-19 NOTE — Progress Notes (Signed)
[facility] Psychosocial Distress Screening Clinical Social Work  Clinical Social Work met with pt and her husband at [facility] to review distress screening protocol, introduce CSW, and explain role of Support Team.  The patient scored a 2 on the Psychosocial Distress Thermometer which indicates no distress. Clinical Social Worker spoke with them to assess for distress and other psychosocial needs. Pt reports she is felling fine about her treatment plan. She feels no more distressed after her appt today. Pt and husband have a 23yo son, Theresia Lo and two other grown children. We discussed some today about helping children cope and resources to assist. CSW provided them with Support Services Calendar today and contact information if needed.   ONCBCN DISTRESS SCREENING 07/19/2015  Screening Type Initial Screening  Distress experienced in past week (1-10) 2  Referral to clinical social work Yes  Referral to support programs Yes    Clinical Social Worker follow up needed: No.  If yes, follow up plan:  Loren Racer, [place]  [facility] Phone: [phone] Fax: [phone]

## 2015-07-19 NOTE — Progress Notes (Signed)
[facility]  Telephone:([phone] Fax:([phone]) [phone]     ID: Emma [name] DOB: 1966/03/13  MR#: [id number]  MWU#:[id number]  Patient Care Team: [name], MD as PCP - [name] (Family Medicine) [name], MD as Consulting Physician ([name] Surgery) Lowella Dell, MD as Consulting Physician (Oncology) Antony Blackbird, MD as Consulting Physician (Radiation Oncology) Salomon Fick, NP as Nurse Practitioner (Hematology and Oncology) Levi Aland, MD as Consulting Physician (Obstetrics and Gynecology) PCP: [name], MD OTHER MD:  CHIEF COMPLAINT: Estrogen receptor positive breast cancer  CURRENT TREATMENT: Neoadjuvant chemotherapy   BREAST CANCER HISTORY: Emma [name] had routine screening mammography at Dr. Ewell Poe office/[facility] 06/23/2015. There was an area of distortion in the left breast. The patient was then referred to the Breast Center where on 07/03/2015 he underwent left diagnostic mammography with tomosynthesis and left breast ultrasonography. The breast density was category B. In the far outer left breast there was an area of distortion measuring approximately 4 cm. There appeared to be a prominent left axillary lymph node. On physical exam at the 2:00 position of the left breast 12 cm from the nipple there was an area of approximately 4 cm of firmness. There was no palpable mass in the left axilla. Ultrasonography confirmed an irregular hypoechoic mass in the upper-outer quadrant of the left breast measuring 2.6 cm. A little closer to the axilla there was a second irregular hypoechoic mass measuring 0.5 cm. The distance between these 2 masses was 3.6 cm. In addition there was a right axillary lymph node with a thickened cortex. It measured 0.9 cm.  On 5 or 20 11/30/2015 the patient underwent biopsy of the 2 areas in the breast as well as the axillary lymph node. The larger of the 2 masses was an invasive lobular carcinoma,  E-cadherin negative, estrogen receptor 100% positive, progesterone receptor 100% positive, both with strong staining intensity, with an MIB-1 of 20%. This second, smaller mass, was also an invasive lobular carcinoma, E-cadherin negative, estrogen receptor 90% positive, progesterone receptor 100% positive, both with strong staining intensity, with an MIB-1 of 5%. The right axillary lymph node was also an invasive mammary carcinoma. And also estrogen receptor positive at 70%, progesterone receptor positive at 90%, with an MIB-1 of 10%.  Her subsequent history is as detailed below  INTERVAL HISTORY: Emma [name] was evaluated in the breast cancer multidisciplinary clinic 07/19/2015 accompanied by her husband Emma [name]. Her case was also presented in the multidisciplinary breast cancer conference that same morning. At that time a preliminary plan was proposed: MRI to help decide between mastectomy and lumpectomy, consideration of neoadjuvant therapy, radiation and anti estrogens adjuvantly.  REVIEW OF SYSTEMS: There were no specific symptoms leading to the original mammogram, which was routinely scheduled. The patient denies unusual headaches, visual changes, nausea, vomiting, stiff neck, dizziness, or gait imbalance. There has been no cough, phlegm production, or pleurisy, no chest pain or pressure, and no change in bowel or bladder habits. The patient denies fever, rash, bleeding, unexplained fatigue or unexplained weight loss. A detailed review of systems was otherwise entirely negative.  PAST MEDICAL HISTORY: Past Medical History  Diagnosis Date  . Hypertension   . Breast cancer of upper-outer quadrant of left female breast (HCC) 07/13/2015    PAST SURGICAL HISTORY: Past Surgical History  Procedure Laterality Date  . Cesarean section  1998  . Foot surgery  1993    bunionectomy  . Dilation and evacuation  05/01/2011    Procedure: DILATATION AND  EVACUATION;  Surgeon: Levi Aland;  Location: [facility];   Service: Gynecology;  Laterality: N/A;    FAMILY HISTORY Family History  Problem Relation Age of Onset  . Colon cancer Mother   The patient's father died at age 85 from "complicated causes". The patient's mother died from colon cancer at the age of 54. He cancer was diagnosed the year prior. The patient has 3 brothers, 11 sisters. There is no history of breast or ovarian cancer and no other colon cancers in the family  GYNECOLOGIC HISTORY:  No LMP recorded. Menarche age 68, first live birth age 58, which the patient understands increases the risk of breast cancer. She is GX P2. She still having regular periods as of March 2017. She used birth control remotely with no complications.  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 Her husband Emma [name] works for a Midwife is Chiropodist of environmental services. Emma [name] has a child from a prior marriage, Emma [name], who is in the [employer]. Emma [name] as a child from a prior marriage, Emma [name], who is going to school in [place]. The couple have added son, Emma [name], 64 years old, at home. The patient has one grandchild. She goes to a local [employer].    ADVANCED DIRECTIVES: no   HEALTH MAINTENANCE: Social History  Substance Use Topics  . Smoking status: Never Smoker   . Smokeless tobacco: None  . Alcohol Use: No     Colonoscopy: January 2016/[facility]  PAP: February 2017  Bone density: Never  Lipid panel:  No Known Allergies  Current Outpatient Prescriptions  Medication Sig Dispense Refill  . amLODipine (NORVASC) 5 MG tablet Take 5 mg by mouth every morning.      . Vitamin D, Ergocalciferol, (DRISDOL) 50000 UNITS CAPS Take 50,000 Units by mouth every 7 (seven) days. Takes every Sunday      No current facility-administered medications for this visit.    OBJECTIVE: Middle-aged African-American woman in no acute distress Filed  Vitals:   07/19/15 0851  BP: 150/76  Pulse: 104  Temp: 98.5 F (36.9 C)  Resp: 18     Body mass index is 35.18 kg/(m^2).    ECOG FS:0 - Asymptomatic  Ocular: Sclerae unicteric, pupils equal, round and reactive to light Ear-nose-throat: Oropharynx clear and moist Lymphatic: No cervical or supraclavicular adenopathy Lungs no rales or rhonchi, good excursion bilaterally Heart regular rate and rhythm, no murmur appreciated Abd soft, nontender, positive bowel sounds MSK no focal spinal tenderness, no joint edema Neuro: non-focal, well-oriented, appropriate affect Breasts: The right breast is unremarkable. I do not palpate a well-defined mass in the left breast although there is some subjective thickening in the superior aspect of the breast. There is no erythema or skin or nipple changes of concern. The left axilla is benign on palpation   LAB RESULTS:  CMP     Component Value Date/Time   NA 141 07/19/2015 0837   K 3.9 07/19/2015 0837   CO2 23 07/19/2015 0837   GLUCOSE 100 07/19/2015 0837   BUN 12.1 07/19/2015 0837   CREATININE 0.9 07/19/2015 0837   CALCIUM 9.1 07/19/2015 0837   PROT 7.8 07/19/2015 0837   ALBUMIN 3.7 07/19/2015 0837   AST 14 07/19/2015 0837   ALT <9 07/19/2015 0837   ALKPHOS 61 07/19/2015 0837   BILITOT 0.49 07/19/2015 0837    INo results found for: SPEP, UPEP  Lab Results  Component Value Date   WBC 11.4* 07/19/2015   NEUTROABS 5.2 07/19/2015   HGB 11.7 07/19/2015   HCT 37.4 07/19/2015   MCV 77.1* 07/19/2015   PLT 326 07/19/2015      Chemistry      Component Value Date/Time   NA 141 07/19/2015 0837   K 3.9 07/19/2015 0837   CO2 23 07/19/2015 0837   BUN 12.1 07/19/2015 0837   CREATININE 0.9 07/19/2015 0837      Component Value Date/Time   CALCIUM 9.1 07/19/2015 0837   ALKPHOS 61 07/19/2015 0837   AST 14 07/19/2015 0837   ALT <9 07/19/2015 0837   BILITOT 0.49 07/19/2015 0837       No results found for: LABCA2  No components found  for: [id number]  No results for input(s): INR in the last 168 hours.  Urinalysis No results found for: COLORURINE, APPEARANCEUR, LABSPEC, PHURINE, GLUCOSEU, HGBUR, BILIRUBINUR, KETONESUR, PROTEINUR, UROBILINOGEN, NITRITE, LEUKOCYTESUR    ELIGIBLE FOR AVAILABLE RESEARCH PROTOCOL: [facility]  STUDIES: Mm Digital Diagnostic Unilat L  07/10/2015  CLINICAL DATA:  Post left breast ultrasound-guided core biopsies and left axillary lymph node ultrasound-guided core biopsy. EXAM: DIAGNOSTIC LEFT MAMMOGRAM POST ULTRASOUND BIOPSY COMPARISON:  Previous exam(s). FINDINGS: Mammographic images were obtained following ultrasound guided biopsy of the palpable mass located within the left breast at the 2 o'clock position 12 cm from the nipple, a small satellite nodule located within the left breast at the 2:30 o'clock position 14 cm from the nipple, and a prominent level 1 left axillary lymph node. The coil shaped clip is in appropriate position associated with the palpable mass located at the 2 o'clock position 12 cm from the nipple. The ribbon shaped clip is in appropriate position associated with the small satellite nodule. The spiral shaped HydroMARK clip is in appropriate position associated with the level 1 left axillary lymph node. IMPRESSION: Appropriate positioning of clips following left breast and axillary lymph node ultrasound-guided biopsies as discussed. Final Assessment: Post Procedure Mammograms for Marker Placement Electronically Signed   By: Rolla Plate M.D.   On: 07/10/2015 16:50   US Breast Ltd Uni Left Inc Axilla  07/17/2015  ADDENDUM REPORT: 07/17/2015 11:02 ADDENDUM: This addendum is created to correct a left/right error in the original report. The final paragraph of the FINDINGS section should read as follows: In the LEFT axilla, a lymph node with a thickened cortex and partially effaced fatty hilum is imaged. This lymph node measures 9 mm short axis, and the cortex measures 4 mm. There is  some vascular flow within a portion of the cortex. Electronically Signed   By: Britta Mccreedy M.D.   On: 07/17/2015 11:02  07/17/2015  CLINICAL DATA:  Possible distortion left breast identified on recent screening mammogram. EXAM: DIGITAL DIAGNOSTIC LEFT MAMMOGRAM WITH 3D TOMOSYNTHESIS WITH CAD ULTRASOUND LEFT BREAST COMPARISON:  06/19/2015 ACR Breast Density Category b: There are scattered areas of fibroglandular density. FINDINGS: 3D tomographic images confirm an area of distortion in the far outer left breast. On mammography, the area of distortion measures approximately 4 cm greatest diameter. Possible prominent left axillary lymph node noted. Mammographic images were processed with CAD. On physical exam, there is an approximately 4 cm area of palpable firmness in the 2 o'clock position of the left breast approximately 12 cm from the nipple. No mass is palpated in the left axilla. Targeted ultrasound is performed, showing an irregular hypoechoic mass with indistinct margins and posterior acoustic shadowing at 2 o'clock position 12 cm from  the nipple. On ultrasound, this area measures approximately 2.3 x 2.1 x 2.6 cm. Further lateral and closer to the axilla, at 2:30 position approximately 12-14 cm from the nipple, is an unsuspected irregular hypoechoic mass measuring 5 x 5 x 5 mm. It is difficult to image this small mass simultaneously on the ultrasound screen as the larger palpable mass. In the right axilla, a lymph node with a thickened cortex and partially effaced fatty hilum is imaged. This lymph node measures 9 mm short axis, and the cortex measures 4 mm. There is some vascular flow within a portion of the cortex. IMPRESSION: 1. Persistent area of architectural distortion and mass in the 2:30 position left breast approximately 12 cm from the nipple. On mammography the abnormality measures up to 4 cm, and on ultrasound it measures 2.6 cm. Findings are suspicious for malignancy. 2. Separate 5 mm suspicious  hypoechoic mass at 2:30 position approximately 12-14 cm from the nipple. 3. Cortical thickening of the left axillary lymph node. This could be reactive, or could reflect metastatic involvement. : A total of 3 ultrasound-guided biopsies are recommended, including 2 masses in the upper-outer quadrant of the left breast and a single left axillary lymph node. Biopsies have been scheduled 08/13/2015 at 3 o'clock p.m. I have discussed the findings and recommendations with the patient. Results were also provided in writing at the conclusion of the visit. If applicable, a reminder letter will be sent to the patient regarding the next appointment. BI-RADS CATEGORY  5: Highly suggestive of malignancy. Electronically Signed: By: Britta Mccreedy M.D. On: 07/03/2015 15:16   Mm Diag Breast Tomo Uni Left  07/17/2015  ADDENDUM REPORT: 07/17/2015 11:02 ADDENDUM: This addendum is created to correct a left/right error in the original report. The final paragraph of the FINDINGS section should read as follows: In the LEFT axilla, a lymph node with a thickened cortex and partially effaced fatty hilum is imaged. This lymph node measures 9 mm short axis, and the cortex measures 4 mm. There is some vascular flow within a portion of the cortex. Electronically Signed   By: Britta Mccreedy M.D.   On: 07/17/2015 11:02  07/17/2015  CLINICAL DATA:  Possible distortion left breast identified on recent screening mammogram. EXAM: DIGITAL DIAGNOSTIC LEFT MAMMOGRAM WITH 3D TOMOSYNTHESIS WITH CAD ULTRASOUND LEFT BREAST COMPARISON:  06/19/2015 ACR Breast Density Category b: There are scattered areas of fibroglandular density. FINDINGS: 3D tomographic images confirm an area of distortion in the far outer left breast. On mammography, the area of distortion measures approximately 4 cm greatest diameter. Possible prominent left axillary lymph node noted. Mammographic images were processed with CAD. On physical exam, there is an approximately 4 cm area of  palpable firmness in the 2 o'clock position of the left breast approximately 12 cm from the nipple. No mass is palpated in the left axilla. Targeted ultrasound is performed, showing an irregular hypoechoic mass with indistinct margins and posterior acoustic shadowing at 2 o'clock position 12 cm from the nipple. On ultrasound, this area measures approximately 2.3 x 2.1 x 2.6 cm. Further lateral and closer to the axilla, at 2:30 position approximately 12-14 cm from the nipple, is an unsuspected irregular hypoechoic mass measuring 5 x 5 x 5 mm. It is difficult to image this small mass simultaneously on the ultrasound screen as the larger palpable mass. In the right axilla, a lymph node with a thickened cortex and partially effaced fatty hilum is imaged. This lymph node measures 9 mm short axis, and  the cortex measures 4 mm. There is some vascular flow within a portion of the cortex. IMPRESSION: 1. Persistent area of architectural distortion and mass in the 2:30 position left breast approximately 12 cm from the nipple. On mammography the abnormality measures up to 4 cm, and on ultrasound it measures 2.6 cm. Findings are suspicious for malignancy. 2. Separate 5 mm suspicious hypoechoic mass at 2:30 position approximately 12-14 cm from the nipple. 3. Cortical thickening of the left axillary lymph node. This could be reactive, or could reflect metastatic involvement. : A total of 3 ultrasound-guided biopsies are recommended, including 2 masses in the upper-outer quadrant of the left breast and a single left axillary lymph node. Biopsies have been scheduled 08/13/2015 at 3 o'clock p.m. I have discussed the findings and recommendations with the patient. Results were also provided in writing at the conclusion of the visit. If applicable, a reminder letter will be sent to the patient regarding the next appointment. BI-RADS CATEGORY  5: Highly suggestive of malignancy. Electronically Signed: By: Britta Mccreedy M.D. 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 GRADE I INVASIVE MAMMARY CARCINOMA of the Left breast at the 2:00 o'clock location. ONE LYMPH NODE POSITIVE FOR METASTATIC MAMMARY CARCINOMA from the LEFT axilla. This was found to be concordant by Dr. Anselmo Pickler. Pathology results were discussed with the patient by telephone. The patient reported doing well after the biopsy. Post biopsy instructions and care were reviewed and questions were answered. The patient was encouraged to call The [facility] for any additional concerns. The patient was referred to [facility] on July 19, 2015. Pathology results reported by Rene Kocher, RN on 07/12/2015. Electronically Signed   By: Rolla Plate M.D.   On: 07/12/2015 08:10  07/12/2015  CLINICAL DATA:  Palpable left breast mass with possible satellite nodule and prominent left axillary lymph node. EXAM: ULTRASOUND GUIDED LEFT BREAST CORE NEEDLE BIOPSY COMPARISON:  Previous exam(s). FINDINGS: I met with the patient and we discussed the procedure of ultrasound-guided biopsy, including benefits and alternatives. We discussed the high likelihood of a successful procedure. We discussed the risks of the procedure, including infection, bleeding, tissue injury, clip migration, and inadequate sampling. Informed written consent was given. The usual time-out protocol was performed immediately prior to the procedure. Using sterile technique and 1% Lidocaine as local anesthetic, under direct ultrasound visualization, a 14 gauge spring-loaded device was used to perform biopsy of these small hypoechoic mass (possible satellite nodule) located within the left breast at the  2:30 o'clock position 14 cm from the nipple using a lateral approach. This small hypoechoic nodule is located approximately 1.2 cm superior and slightly lateral to the palpable mass. At the conclusion of the procedure a ribbon shaped tissue marker clip was deployed into the biopsy cavity. Follow up 2 view mammogram was performed and dictated separately. IMPRESSION: Ultrasound guided biopsy of the small possible satellite nodule located within the left breast at the 2:30 o'clock position 14 cm from the nipple. No apparent complications. Electronically Signed: By: Rolla Plate M.D. 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 GRADE I INVASIVE MAMMARY CARCINOMA of the Left breast at the 2:00 o'clock location. ONE LYMPH NODE POSITIVE FOR METASTATIC MAMMARY CARCINOMA from the LEFT axilla. This was found to be concordant by Dr. Anselmo Pickler. Pathology results were discussed with the patient by telephone. The patient reported doing well after the biopsy. Post biopsy instructions and care were reviewed and questions were answered. The patient was encouraged to call [facility] for any additional concerns. The patient was referred to [facility] at [facility] on July 19, 2015. Pathology results reported by Rene Kocher, RN on 07/12/2015. Electronically Signed   By: Rolla Plate M.D.   On: 07/12/2015 08:11  07/12/2015  CLINICAL DATA:  Palpable left breast mass with possible small satellite nodule and prominent left axillary lymph node. EXAM: ULTRASOUND GUIDED LEFT AXILLARY LYMPH NODE CORE NEEDLE BIOPSY COMPARISON:  Previous exam(s). FINDINGS: I met with the patient and we discussed the procedure of  ultrasound-guided biopsy, including benefits and alternatives. We discussed the high likelihood of a successful procedure. We discussed the risks of the procedure, including infection, bleeding, tissue injury, clip migration, and inadequate sampling. Informed written consent was given. The usual time-out protocol was performed immediately prior to the procedure. Using sterile technique and 1% Lidocaine as local anesthetic, under direct ultrasound visualization, a 14 gauge spring-loaded device was used to perform biopsy of the prominent level 1 left axillary lymph node using a lateral approach. At the conclusion of the procedure a spiral shaped HydroMARK tissue marker clip was deployed into the biopsy cavity. Follow up 2 view mammogram was performed and dictated separately. IMPRESSION: Ultrasound guided biopsy of the mildly enlarged level 1 left axillary lymph node as discussed. No apparent complications. Electronically Signed: By: Rolla Plate M.D. 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 GRADE I INVASIVE MAMMARY CARCINOMA of the Left breast at the 2:00 o'clock location. ONE LYMPH NODE POSITIVE FOR METASTATIC MAMMARY CARCINOMA from the LEFT axilla. This was found to be concordant by Dr. Anselmo Pickler. Pathology results were discussed with the patient by telephone. The patient reported doing well after the biopsy. Post biopsy instructions and care were reviewed and questions were answered. The patient was encouraged to call The [facility] for any additional concerns. The patient was referred to [facility] at [facility] on July 19, 2015. Pathology results reported by Rene Kocher, RN on 07/12/2015.  Electronically Signed   By: Rolla Plate M.D.   On: 07/12/2015 08:12  07/12/2015  CLINICAL DATA:  Palpable left breast mass with possible small satellite nodule and prominent left axillary lymph node. EXAM: ULTRASOUND GUIDED LEFT BREAST CORE NEEDLE BIOPSY COMPARISON:  Previous exam(s). FINDINGS: I met with the patient and we discussed the procedure of ultrasound-guided biopsy, including benefits and alternatives. We discussed the high likelihood of a successful procedure. We discussed the risks of the procedure, including infection, bleeding, tissue injury, clip migration, and inadequate sampling. Informed written consent was given. The usual time-out protocol was performed immediately prior to the procedure.  Using sterile technique and 1% Lidocaine as local anesthetic, under direct ultrasound visualization, a 14 gauge spring-loaded device was used to perform biopsy of the palpable mass located within the left breast at the 2 o'clock position 12 cm from the nipple using a lateral approach. At the conclusion of the procedure a coil shaped tissue marker clip was deployed into the biopsy cavity. Follow up 2 view mammogram was performed and dictated separately. IMPRESSION: Ultrasound guided biopsy of the palpable left breast mass located the 2 o'clock position 12 cm from the nipple as discussed above. No apparent complications. Electronically Signed: By: Rolla Plate M.D. On: 07/10/2015 16:43    ASSESSMENT: 50 y.o. [facility] woman status post left breast biopsy 07/10/2015 of 2 separate masses as well as left axillary lymph node, all positive for invasive lobular carcinoma, grade 1, estrogen and progesterone receptor positive, HER-2 not amplified, with an MIB-1 between 5 and 20%  (1) neoadjuvan chemotherapy to consist of cyclophosphamide and docetaxel every 3 weeks 4  (2) definitive surgery to follow, with consideration of the Alliance trial  (3) adjuvant radiation to follow  surgery  (4) anti-estrogens to start at the completion of local therapy  PLAN: We spent the better part of today's hour-long appointment discussing the biology of breast cancer in [name], and the specifics of the patient's tumor in particular. Tnia understands that she has invasive lobular carcinoma, which is not the most common type of breast cancer. There are subtle differences between lobular and ductal breast cancers, although we tend to treat them pretty much the same. One difference is that lobular breast cancers when treated neoadjuvantly do not respond as briskly.  In Sheila's case we are suggesting neoadjuvant chemotherapy. We discussed the fact that the sequence (whether chemotherapy or surgery is done first) does not affect the ultimate outcome. In her case we think neoadjuvant treatment is better first because it will make the surgery easier, giving her a better chance of keeping her breast with a good cosmetic result, and secondly because it will allow her to participate in the Alliance trial, which will give her a chance to avoid full axillary lymph node dissection.  We discussed the difference between local and systemic therapy and also the need for staging studies to make sure she does not have stage IV disease. We also discussed the possible toxicities, side effects and complications of cyclophosphamide and docetaxel. She will also come to chemotherapy school to discuss that further.  At this point she has not quite decided if she is going to go on temporary disability or continue to work through the chemotherapy portion. We discussed her receiving the chemotherapy closer to home, but since it will be only every 3 weeks 4 she would prefer to do that here. When she gets to the radiation portion however she will probably want to do that a little closer to home.   Once she has her port, has come to chemotherapy class, and had her staging studies, she will return to see [place] to get  oriented to how to take her supportive medications. I anticipate starting her chemotherapy the last week of this month.  [place] has a good understanding of the overall plan. She agrees with it. She knows the goal of treatment in her case is cure. She will call with any problems that may develop before her next visit here.  Lowella Dell, MD   07/21/2015 2:37 PM Medical Oncology and Hematology [facility] [address]  [place], [place] [address] Tel. [phone]    Fax. [phone]

## 2015-07-19 NOTE — Progress Notes (Signed)
Subjective:     Patient ID: Emma [name], female   DOB: [birth year], 50 y.o.   MRN: [id number]  HPI   Review of Systems     Objective:   Physical Exam For the patient to understand and be given the tools to implement a healthy plant based diet during their cancer diagnosis.     Assessment:     Patient was seen today and found to be in good spirits and accompanied by her husband. Pts ht 5'5'', wt 211 pounds, BMI 35.3. Pts medications: vitamin D. Pts labs: WBC 11.4, MCV 77.1, MCH 24.1, RDW 17.5. Pt was attentive and listening. Pts husband was weery of dietitian's education. Pts husband shared his beliefs that one has less medical issues from eating organic and "they" will have you believe are food source is safe when it really is unhealthy and that is why organic is needed. Pt states we need not talk about this I am ready to go and states she thinks organic is a money making scheme and does not agree with her husband. Pts husband used to work in Primary school teacher.      Plan:     Dietitian educated the patient on implementing a plant based diet by incorporating more plant proteins, fruits, and vegetables. As a part of a healthy routine physical activity was discussed. Dietitian educated on current evidence based research concerning organic labeling.  The importance of legitimate, evidence based information was discussed and examples were given. A folder of evidence based information with a focus on a plant based diet and general nutrition during cancer was given to the patient.  As a part of the continuum of care the cancer dietitian's contact information was given to the patient in the event they would like to have a follow up appointment.

## 2015-07-19 NOTE — Patient Instructions (Signed)

## 2015-07-19 NOTE — Progress Notes (Signed)
Ms. Willhelm is a very pleasant 50 y.o. female from [place], [place] with newly diagnosed invasive lobular carcinoma of the left breast.  Biopsy results revealed the tumor's prognostic profile is ER positive, PR positive, and HER2/neu negative.  She presents today with her husband to the [facility]) for treatment consideration and recommendations from the breast surgeon, radiation oncologist, and medical oncologist.     I briefly met with Ms. Greensburg and her husband during her [facility] visit today. We discussed the purpose of the Survivorship Clinic, which will include monitoring for recurrence, coordinating completion of age and gender-appropriate cancer screenings, promotion of overall wellness, as well as managing potential late/long-term side effects of anti-cancer treatments.    The treatment plan for Ms. Juncos will likely include surgery, chemotherapy, radiation therapy, and anti-estrogen therapy. As of today, the intent of treatment for Ms. [place] is cure, therefore she will be eligible for the Survivorship Clinic upon her completion of treatment.  Her survivorship care plan (SCP) document will be drafted and updated throughout the course of her treatment trajectory. She will receive the SCP in an office visit with myself in the Survivorship Clinic once she has completed treatment.   Ms. Reaves was encouraged to ask questions and all questions were answered to her satisfaction.  She was given my business card and encouraged to contact me with any concerns regarding survivorship.  I look forward to participating in her care.   Kenn File, [place] [phone]

## 2015-07-19 NOTE — Therapy (Signed)
[facility] [address] [place], [place], [address] Phone: [phone]   Fax:  [phone]  Physical Therapy Evaluation  Patient Details  Name: Emma [name] MRN: [id number] Date of Birth: 02/06/1966 Referring Provider: Dr. [name]  Encounter Date: 07/19/2015      PT End of Session - 07/19/15 1344    Visit Number 1   Number of Visits 1   PT Start Time 0950   PT Stop Time 1005  Also saw pt from 1100-1108 for a total of 23 minutes   PT Time Calculation (min) 15 min   Activity Tolerance Patient tolerated treatment well   Behavior During Therapy [facility] for tasks assessed/performed      Past Medical History  Diagnosis Date  . Hypertension   . Breast cancer of upper-outer quadrant of left female breast (HCC) 07/13/2015    Past Surgical History  Procedure Laterality Date  . Cesarean section  1998  . Foot surgery  1993    bunionectomy  . Dilation and evacuation  05/01/2011    Procedure: DILATATION AND EVACUATION;  Surgeon: Levi Aland;  Location: [facility];  Service: Gynecology;  Laterality: N/A;    There were no vitals filed for this visit.  Visit Diagnosis:  Carcinoma of upper-outer quadrant of left female breast [facility]) - Plan: PT plan of care cert/re-cert      Subjective Assessment - 07/19/15 1227    Subjective Patient reports she was diagnosed with left breast cancer and is being seen today for a baseline assessment of her newly diagnosed left breast cancer.   Patient is accompained by: Family member   Pertinent History Patient was diagnosed with left invasive mammary and lobular carcinoma breast cancer on 06/19/15.  It measures 2.3 cm, is ER/PR positive, HER2 negative with a Ki67 of 5-20%.  She has a biopsied positive lymph node. It is located in the upper outre quadrant.   Patient Stated Goals reduce lymphedema risk and learn post op shoulder ROM HEP   Currently in Pain? No/denies            [facility] PT  Assessment - 07/19/15 0001    Assessment   Medical Diagnosis Left breast cancer   Referring Provider Dr. [name]   Onset Date/Surgical Date 06/19/15   Hand Dominance Right   Prior Therapy none   Precautions   Precautions Other (comment)   Precaution Comments Active breast cancer   Restrictions   Weight Bearing Restrictions No   Balance Screen   Has the patient fallen in the past 6 months No   Has the patient had a decrease in activity level because of a fear of falling?  No   Is the patient reluctant to leave their home because of a fear of falling?  No   Home Nurse, mental health Private residence   Living Arrangements Spouse/significant other;Children  Husband and 26 y.o. son   Available Help at Discharge Family   Type of Home Other(Comment)  Lives in [place] which is 1 hour, 20 min away   Prior Function   Level of Independence Independent   Vocation Full time employment   Production manager   Leisure She is not currently exercising but previously had a Biomedical scientist   Overall Cognitive Status Within Functional Limits for tasks assessed   Posture/Postural Control   Posture/Postural Control No significant limitations   ROM / Strength   AROM / PROM / Strength AROM;Strength  AROM   AROM Assessment Site Shoulder   Right/Left Shoulder Right;Left   Right Shoulder Extension 45 Degrees   Right Shoulder Flexion 146 Degrees   Right Shoulder ABduction 160 Degrees   Right Shoulder Internal Rotation 68 Degrees   Right Shoulder External Rotation 83 Degrees   Left Shoulder Extension 46 Degrees   Left Shoulder Flexion 142 Degrees   Left Shoulder ABduction 146 Degrees   Left Shoulder Internal Rotation 66 Degrees   Left Shoulder External Rotation 71 Degrees   Strength   Overall Strength Within functional limits for tasks performed           LYMPHEDEMA/ONCOLOGY QUESTIONNAIRE - 07/19/15 1340    Type   Cancer Type Left  breast   Lymphedema Assessments   Lymphedema Assessments Upper extremities   Right Upper Extremity Lymphedema   10 cm Proximal to Olecranon Process 37.8 cm   Olecranon Process 26.6 cm   10 cm Proximal to Ulnar Styloid Process 23.3 cm   Just Proximal to Ulnar Styloid Process 15.4 cm   Across Hand at [facility] 19.4 cm   At [place] of 2nd Digit 6.1 cm   Left Upper Extremity Lymphedema   10 cm Proximal to Olecranon Process 38.3 cm   Olecranon Process 27 cm   10 cm Proximal to Ulnar Styloid Process 22.6 cm   Just Proximal to Ulnar Styloid Process 15.5 cm   Across Hand at [facility] 19.3 cm   At [place] of 2nd Digit 6 cm      Patient was instructed today in a home exercise program today for post op shoulder range of motion. These included active assist shoulder flexion in sitting, scapular retraction, wall walking with shoulder abduction, and hands behind head external rotation.  She was encouraged to do these twice a day, holding 3 seconds and repeating 5 times when permitted by her physician.           PT Education - 07/19/15 1343    Education provided Yes   Education Details Lymphedema risk reduction and post op shoulder ROM HEP   Person(s) Educated Patient;Spouse   Methods Explanation;Demonstration;Handout   Comprehension Returned demonstration;Verbalized understanding              Breast Clinic Goals - 07/19/15 1348    Patient will be able to verbalize understanding of pertinent lymphedema risk reduction practices relevant to her diagnosis specifically related to skin care.   Time 1   Period Days   Status Achieved   Patient will be able to return demonstrate and/or verbalize understanding of the post-op home exercise program related to regaining shoulder range of motion.   Time 1   Period Days   Status Achieved   Patient will be able to verbalize understanding of the importance of attending the postoperative After Breast Cancer Class for further lymphedema risk  reduction education and therapeutic exercise.   Time 1   Period Days   Status Achieved              Plan - 07/19/15 1345    Clinical Impression Statement Patient was diagnosed with left invasive mammary and lobular carcinoma breast cancer on 06/19/15.  It measures 2.3 cm, is ER/PR positive, HER2 negative with a Ki67 of 5-20%.  She has a biopsied positive lymph node. It is located in the upper outre quadrant.  She is planning to have neoadjuvant chemotherapy followed by a left lumpectomy or mastectomy and either a sentinel node biopsy or axillary lymph  node dissection (depending on if she enrolls in the Alliance trial) followed by radiation.  She will benefit from post op PT to regain shoulder ROM and reduce lymphedema risk.  We could help her find an appropriate PT closer to home after surgery if needed.   Pt will benefit from skilled therapeutic intervention in order to improve on the following deficits Decreased strength;Increased edema;Decreased knowledge of precautions;Pain;Impaired UE functional use;Decreased range of motion   Rehab Potential Excellent   Clinical Impairments Affecting Rehab Potential none   PT Frequency One time visit   PT Treatment/Interventions Therapeutic exercise;Patient/family education   PT Next Visit Plan F/u after surgery   PT Home Exercise Plan Shoulder ROM HEP   Consulted and Agree with Plan of Care Patient;Family member/caregiver   Family Member Consulted husband     Patient will follow up at outpatient cancer rehab if needed following surgery.  If the patient requires physical therapy at that time, a specific plan will be dictated and sent to the referring physician for approval. The patient was educated today on appropriate basic range of motion exercises to begin post operatively and the importance of attending the After Breast Cancer class following surgery.  Patient was educated today on lymphedema risk reduction practices as it pertains to  recommendations that will benefit the patient immediately following surgery.  She verbalized good understanding.  No additional physical therapy is indicated at this time.       Problem List Patient Active Problem List   Diagnosis Date Noted  . Breast cancer of upper-outer quadrant of left female breast (HCC) 07/13/2015    Bethann Punches, PT 07/19/2015 1:50 PM  [facility] [address] [place], [place], [address] Phone: [phone]   Fax:  [phone]  Name: Emma [name] MRN: [id number] Date of Birth: Nov 11, 1965

## 2015-07-19 NOTE — Telephone Encounter (Signed)
appt made and avs printed. Awiting orders for CT/Bone Density to be done in 1 week per 3/8 pof. Sent message to MD

## 2015-07-21 ENCOUNTER — Ambulatory Visit
Admission: RE | Admit: 2015-07-21 | Discharge: 2015-07-21 | Disposition: A | Discharge status: Home / Self Care | Payer: BC Managed Care – PPO | Source: Ambulatory Visit | Attending: Oncology | Admitting: Oncology

## 2015-07-21 DIAGNOSIS — C50412 Malignant neoplasm of upper-outer quadrant of left female breast: Secondary | ICD-10-CM

## 2015-07-21 MED ORDER — GADOBENATE DIMEGLUMINE 529 MG/ML IV SOLN
20.0000 mL | Freq: Once | INTRAVENOUS | Status: AC | PRN
  Administered 2015-07-21: 20 mL via INTRAVENOUS

## 2015-07-23 ENCOUNTER — Other Ambulatory Visit: Payer: Self-pay | Admitting: Oncology

## 2015-07-24 ENCOUNTER — Telehealth: Payer: Self-pay | Admitting: *Deleted

## 2015-07-24 NOTE — Telephone Encounter (Signed)
Spoke to pt concerning [place] from 07/19/15. Denies questions or concerns regarding dx or treatment care plan. Discussed and confirmed future appts.  Encourage pt to call with needs. Received verbal understanding. Contact information provided.

## 2015-07-25 ENCOUNTER — Encounter (HOSPITAL_BASED_OUTPATIENT_CLINIC_OR_DEPARTMENT_OTHER): Payer: Self-pay | Admitting: *Deleted

## 2015-07-26 ENCOUNTER — Encounter: Payer: Self-pay | Admitting: Oncology

## 2015-07-26 NOTE — Progress Notes (Signed)
Left msg for pt to return my call to discuss copay assistance.  °

## 2015-07-27 ENCOUNTER — Encounter (HOSPITAL_COMMUNITY): Payer: Self-pay

## 2015-07-27 ENCOUNTER — Encounter (HOSPITAL_COMMUNITY): Admission: RE | Payer: Self-pay | Source: Ambulatory Visit

## 2015-07-27 ENCOUNTER — Encounter: Payer: Self-pay | Admitting: Oncology

## 2015-07-27 ENCOUNTER — Encounter (HOSPITAL_COMMUNITY)
Admission: RE | Admit: 2015-07-27 | Discharge: 2015-07-27 | Disposition: A | Discharge status: Home / Self Care | Payer: BC Managed Care – PPO | Source: Ambulatory Visit | Attending: Oncology | Admitting: Oncology

## 2015-07-27 ENCOUNTER — Ambulatory Visit (HOSPITAL_COMMUNITY)
Admission: RE | Admit: 2015-07-27 | Discharge: 2015-07-27 | Disposition: A | Discharge status: Home / Self Care | Payer: BC Managed Care – PPO | Source: Ambulatory Visit | Attending: Oncology | Admitting: Oncology

## 2015-07-27 ENCOUNTER — Ambulatory Visit (HOSPITAL_COMMUNITY): Admission: RE | Admit: 2015-07-27 | Payer: BC Managed Care – PPO | Source: Ambulatory Visit | Admitting: Surgery

## 2015-07-27 DIAGNOSIS — C50412 Malignant neoplasm of upper-outer quadrant of left female breast: Secondary | ICD-10-CM | POA: Insufficient documentation

## 2015-07-27 DIAGNOSIS — R937 Abnormal findings on diagnostic imaging of other parts of musculoskeletal system: Secondary | ICD-10-CM | POA: Diagnosis not present

## 2015-07-27 DIAGNOSIS — M1711 Unilateral primary osteoarthritis, right knee: Secondary | ICD-10-CM | POA: Insufficient documentation

## 2015-07-27 SURGERY — INSERTION, TUNNELED CENTRAL VENOUS DEVICE, WITH PORT
Anesthesia: General

## 2015-07-27 MED ORDER — IOHEXOL 300 MG/ML  SOLN
50.0000 mL | Freq: Once | INTRAMUSCULAR | Status: AC | PRN
  Administered 2015-07-27: 50 mL via ORAL

## 2015-07-27 MED ORDER — TECHNETIUM TC 99M MEDRONATE IV KIT
26.7000 | PACK | Freq: Once | INTRAVENOUS | Status: AC | PRN
  Administered 2015-07-27: 26.7 via INTRAVENOUS

## 2015-07-27 MED ORDER — IOHEXOL 300 MG/ML  SOLN
100.0000 mL | Freq: Once | INTRAMUSCULAR | Status: AC | PRN
  Administered 2015-07-27: 100 mL via INTRAVENOUS

## 2015-07-27 NOTE — Progress Notes (Signed)
forms left in my box-patient in treatment- left for dr. Jana Hakim to sign-off till 08/02/15

## 2015-07-30 NOTE — H&P (Signed)
[facility]  Location: [facility] Surgery Patient #: [id number] DOB: 1965-10-14 Undefined / Language: [place] / Race: Black or African American Female  History of Present Illness   The patient is a 50 year old female who presents with breast cancer.   Her PCP is Dr. Malva Limes  She is at the Breast [facility] - Drs. Magrinat/Kinard.  She comes with her husband, [name].   Ms. Verderosa thought she felt something in her left breast. Her last mammogram was about 1 year ago. She has no family history of breast cancer. She is not on hormone medication. She lives in [place], [place], but has returned to [place] for her medical care. For now she will get her treatment in [place], though she is considering radiation therapy closer to [place].  She had [facility] which showed - 1. Persistent area of architectural distortion and mass in the 2:30 position left breast approximately 12 cm from the nipple. On mammography the abnormality measures up to 4 cm, and on ultrasound it measures 2.6 cm. Findings are suspicious for malignancy. 2. Separate 5 mm suspicious hypoechoic mass at 2:30 position approximately 12-14 cm from the nipple. 3. Cortical thickening of the left axillary lymph node. This could be reactive, or could reflect metastatic involvement.  Biopsies of teh Left breast on 07/10/2015 [phone]) - showed ILC at the 2 o'clock and 2:30 o'clock position, lymph node positive, ER/PR postive, Her2Neu - neg, Ki67 - 5 - 20%  I discussed the options for breast cancer treatment with the patient. She is in the Breast multidisciplinary clinic, which includes medical oncology and radiation oncology. I discussed the surgical options of lumpectomy vs. mastectomy. If mastectomy, there is the possibility of reconstruction. I discussed the options of lymph node biopsy. The treatment plan depends on  the pathologic staging of the tumor and the patient's personal wishes. The risks of surgery include, but are not limited to, bleeding, infection, the need for further surgery, and nerve injury. The patient has been given literature on the treatment of breast cancer.  Plan: 1) Power port, 2) MRI (this woudl be best done in our system), 3) Neo-adjuvant chemotherapy Her final surgery will depend on the response to chemotx and her MRI. Because she has lobular ca, there is the expectation that the reponse will be less that average. She also may be a candidate for the Alliance Trial, which could spare her a full axillary node dissection.  MRI - 07/21/2015 - 1. Masslike and non mass like enhancement involving the upper inner quadrant of the left breast, middle to posterior depth, corresponding to the biopsy-proven invasive lobular carcinoma and LCIS. Measurements are given above. The biopsy clips are at the anterior margin of this enhancement.  2. 2.0 cm pathologic left axillary lymph node corresponding to the biopsy-proven metastatic disease.  3. Indeterminate 1.2 cm right axillary lymph node with cortical thickening. In the absence of malignant enhancement in the right breast, this is statistically likely to represent a reactive node.  Past Medical History: 1. HTN x 15 years 2. 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  Diagnostic Studies History Reinaldo Meeker, RN; 07/19/2015 7:47 AM) Colonoscopy within last year Mammogram  within last year Pap Smear 1-5 years ago  Medication History Reinaldo Meeker, RN; 07/19/2015 7:48 AM) No Current Medications Medications Reconciled  Social History Reinaldo Meeker, RN; 07/19/2015 7:47 AM) Alcohol use Occasional alcohol use. Caffeine use Carbonated beverages, Coffee. No drug use Tobacco use Never smoker.  Family History Reinaldo Meeker, RN; 07/19/2015 7:47 AM) Colon Cancer Mother. Hypertension Father, Mother, Sister.  Pregnancy / Birth History Reinaldo Meeker, RN; 07/19/2015 7:47 AM) Age at menarche 11 years. Contraceptive History Oral contraceptives. Gravida 2 Maternal age 7-35 Para 2 Regular periods    Review of Systems Reinaldo Meeker RN; 07/19/2015 7:47 AM) General Not Present- Appetite Loss, Chills, Fatigue, Fever, Night Sweats, Weight Gain and Weight Loss. Skin Not Present- Change in Wart/Mole, Dryness, Hives, Jaundice, New Lesions, Non-Healing Wounds, Rash and Ulcer. HEENT Present- Wears glasses/contact lenses. Not Present- Earache, Hearing Loss, Hoarseness, Nose Bleed, Oral Ulcers, Ringing in the Ears, Seasonal Allergies, Sinus Pain, Sore Throat, Visual Disturbances and Yellow Eyes. Respiratory Not Present- Bloody sputum, Chronic Cough, Difficulty Breathing, Snoring and Wheezing. Cardiovascular Not Present- Chest Pain, Difficulty Breathing Lying Down, Leg Cramps, Palpitations, Rapid Heart Rate, Shortness of Breath and Swelling of Extremities. Gastrointestinal Not Present- Abdominal Pain, Bloating, Bloody Stool, Change in Bowel Habits, Chronic diarrhea, Constipation, Difficulty Swallowing, Excessive gas, Gets full quickly at meals, Hemorrhoids, Indigestion, Nausea, Rectal Pain and Vomiting. Female Genitourinary Not Present- Frequency, Nocturia, Painful Urination, Pelvic Pain and Urgency. Neurological Not Present- Decreased Memory, Fainting, Headaches, Numbness, Seizures, Tingling, Tremor, Trouble walking and Weakness. Psychiatric Not Present- Anxiety, Bipolar, Change  in Sleep Pattern, Depression, Fearful and Frequent crying. Endocrine Not Present- Cold Intolerance, Excessive Hunger, Hair Changes, Heat Intolerance, Hot flashes and New Diabetes. Hematology Not Present- Easy Bruising, Excessive bleeding, Gland problems, HIV and Persistent Infections.   Physical Exam:  General: Middle aged AA Falert and generally healthy appearing. HEENT: Normal. Pupils equal.  Neck: Supple. No mass. No thyroid mass.  Lymph Nodes: No supraclavicular or cervical nodes. She has some fullness in her left axilla  Lungs: Clear to auscultation and symmetric breath sounds. Heart: RRR. No murmur or rub.  Breasts: Right - No mass Left - I feel a 3 cm fullness at the 3 o'clock position of the left breast  Abdomen: Soft. No mass. No tenderness. No hernia. Normal bowel sounds. No abdominal scars. Rectal: Not done.  Extremities: Good strength and ROM in upper and lower extremities.  Neurologic: Grossly intact to motor and sensory function. Psychiatric: Has normal mood and affect. Behavior is normal.   Assessment & Plan  1.  BREAST CANCER, STAGE 2, LEFT (C50.912)  Story: Biopsies of the Left breast on 07/10/2015 ([id number]) - showed ILC at the 2 o'clock and 2:30 o'clock position (2 foci), lymph node positive, ER/PR postive, Her2Neu - neg, Ki67 - 5 - 20%   Drs. Magrinat and Kinard  Plan:   1) Power port,   2) MRI (this woudl be best done in our system),   3) Neo-adjuvant chemotherapy  Her final surgery will depend on the response to chemotx and her MRI. Because she has lobular ca, there is the expectation that the reponse will be less than average.  She also may be a candidate for the Alliance Trial, which could spare her a full axillary node dissection.   Addendum Note(Adeleigh Barletta H. Ezzard Standing MD; 07/24/2015 3:30  PM) From [place]:  Ms [place] was seen in clinic on 3/8. She is scheduled for the following.   MRI 3/10  CT/Bone Scan 3/16  Port 3/20  F/u with  Herbert Seta and chemo class 3/23  1st chemo 3/27  2. HTN x 15 years   [name], MD, [facility] Surgery Pager: [phone] Office phone:  [phone]

## 2015-07-31 ENCOUNTER — Ambulatory Visit (HOSPITAL_BASED_OUTPATIENT_CLINIC_OR_DEPARTMENT_OTHER): Payer: BC Managed Care – PPO | Admitting: Anesthesiology

## 2015-07-31 ENCOUNTER — Ambulatory Visit (HOSPITAL_COMMUNITY): Payer: BC Managed Care – PPO

## 2015-07-31 ENCOUNTER — Encounter (HOSPITAL_BASED_OUTPATIENT_CLINIC_OR_DEPARTMENT_OTHER): Payer: Self-pay

## 2015-07-31 ENCOUNTER — Encounter (HOSPITAL_BASED_OUTPATIENT_CLINIC_OR_DEPARTMENT_OTHER)
Admission: RE | Disposition: A | Discharge status: Home / Self Care | Payer: Self-pay | Source: Ambulatory Visit | Attending: Surgery

## 2015-07-31 ENCOUNTER — Other Ambulatory Visit: Payer: Self-pay

## 2015-07-31 ENCOUNTER — Ambulatory Visit (HOSPITAL_BASED_OUTPATIENT_CLINIC_OR_DEPARTMENT_OTHER)
Admission: RE | Admit: 2015-07-31 | Discharge: 2015-07-31 | Disposition: A | Discharge status: Home / Self Care | Payer: BC Managed Care – PPO | Source: Ambulatory Visit | Attending: Surgery | Admitting: Surgery

## 2015-07-31 DIAGNOSIS — C50912 Malignant neoplasm of unspecified site of left female breast: Secondary | ICD-10-CM | POA: Diagnosis present

## 2015-07-31 DIAGNOSIS — Z95828 Presence of other vascular implants and grafts: Secondary | ICD-10-CM

## 2015-07-31 DIAGNOSIS — I10 Essential (primary) hypertension: Secondary | ICD-10-CM | POA: Insufficient documentation

## 2015-07-31 DIAGNOSIS — Z419 Encounter for procedure for purposes other than remedying health state, unspecified: Secondary | ICD-10-CM

## 2015-07-31 HISTORY — PX: PORTACATH PLACEMENT: SHX2246

## 2015-07-31 SURGERY — INSERTION, TUNNELED CENTRAL VENOUS DEVICE, WITH PORT
Anesthesia: General | Site: Chest | Laterality: Right

## 2015-07-31 MED ORDER — DEXAMETHASONE SODIUM PHOSPHATE 4 MG/ML IJ SOLN
INTRAMUSCULAR | Status: DC | PRN
  Administered 2015-07-31: 10 mg via INTRAVENOUS

## 2015-07-31 MED ORDER — BUPIVACAINE HCL (PF) 0.25 % IJ SOLN
INTRAMUSCULAR | Status: AC
  Filled 2015-07-31: qty 30

## 2015-07-31 MED ORDER — HEPARIN SOD (PORK) LOCK FLUSH 100 UNIT/ML IV SOLN
INTRAVENOUS | Status: DC | PRN
  Administered 2015-07-31: 500 [IU] via INTRAVENOUS

## 2015-07-31 MED ORDER — LACTATED RINGERS IV SOLN
INTRAVENOUS | Status: DC
  Administered 2015-07-31: 08:00:00 via INTRAVENOUS

## 2015-07-31 MED ORDER — LIDOCAINE HCL (PF) 1 % IJ SOLN
INTRAMUSCULAR | Status: AC
  Filled 2015-07-31: qty 30

## 2015-07-31 MED ORDER — CHLORHEXIDINE GLUCONATE 4 % EX LIQD: 1.0000 "application " | Freq: Once | CUTANEOUS | Status: DC

## 2015-07-31 MED ORDER — SCOPOLAMINE 1 MG/3DAYS TD PT72: 1.0000 | MEDICATED_PATCH | Freq: Once | TRANSDERMAL | Status: DC | PRN

## 2015-07-31 MED ORDER — ONDANSETRON HCL 4 MG/2ML IJ SOLN
INTRAMUSCULAR | Status: DC | PRN
  Administered 2015-07-31: 4 mg via INTRAVENOUS

## 2015-07-31 MED ORDER — KETOROLAC TROMETHAMINE 30 MG/ML IJ SOLN: 30.0000 mg | Freq: Once | INTRAMUSCULAR | Status: DC

## 2015-07-31 MED ORDER — MIDAZOLAM HCL 2 MG/2ML IJ SOLN
1.0000 mg | INTRAMUSCULAR | Status: DC | PRN
  Administered 2015-07-31: 2 mg via INTRAVENOUS

## 2015-07-31 MED ORDER — GLYCOPYRROLATE 0.2 MG/ML IJ SOLN: 0.2000 mg | Freq: Once | INTRAMUSCULAR | Status: DC | PRN

## 2015-07-31 MED ORDER — EPHEDRINE SULFATE 50 MG/ML IJ SOLN
INTRAMUSCULAR | Status: DC | PRN
  Administered 2015-07-31 (×2): 10 mg via INTRAVENOUS

## 2015-07-31 MED ORDER — DEXAMETHASONE SODIUM PHOSPHATE 10 MG/ML IJ SOLN
INTRAMUSCULAR | Status: AC
Start: 2015-07-31 — End: 2015-07-31
  Filled 2015-07-31: qty 1

## 2015-07-31 MED ORDER — HEPARIN SOD (PORK) LOCK FLUSH 100 UNIT/ML IV SOLN
INTRAVENOUS | Status: AC
  Filled 2015-07-31: qty 5

## 2015-07-31 MED ORDER — FENTANYL CITRATE (PF) 100 MCG/2ML IJ SOLN
50.0000 ug | INTRAMUSCULAR | Status: DC | PRN
  Administered 2015-07-31: 100 ug via INTRAVENOUS

## 2015-07-31 MED ORDER — POVIDONE-IODINE 10 % EX OINT
TOPICAL_OINTMENT | CUTANEOUS | Status: AC
Start: 2015-07-31 — End: 2015-07-31
  Filled 2015-07-31: qty 28.35

## 2015-07-31 MED ORDER — FENTANYL CITRATE (PF) 100 MCG/2ML IJ SOLN: 25.0000 ug | INTRAMUSCULAR | Status: DC | PRN

## 2015-07-31 MED ORDER — LIDOCAINE HCL (CARDIAC) 20 MG/ML IV SOLN
INTRAVENOUS | Status: DC | PRN
  Administered 2015-07-31: 60 mg via INTRAVENOUS

## 2015-07-31 MED ORDER — HEPARIN (PORCINE) IN NACL 2-0.9 UNIT/ML-% IJ SOLN
INTRAMUSCULAR | Status: AC
Start: 2015-07-31 — End: 2015-07-31
  Filled 2015-07-31: qty 500

## 2015-07-31 MED ORDER — LIDOCAINE HCL (CARDIAC) 20 MG/ML IV SOLN
INTRAVENOUS | Status: AC
  Filled 2015-07-31: qty 5

## 2015-07-31 MED ORDER — PROPOFOL 500 MG/50ML IV EMUL
INTRAVENOUS | Status: AC
  Filled 2015-07-31: qty 50

## 2015-07-31 MED ORDER — FENTANYL CITRATE (PF) 100 MCG/2ML IJ SOLN
INTRAMUSCULAR | Status: AC
  Filled 2015-07-31: qty 2

## 2015-07-31 MED ORDER — CEFAZOLIN SODIUM-DEXTROSE 2-3 GM-% IV SOLR
INTRAVENOUS | Status: AC
  Filled 2015-07-31: qty 50

## 2015-07-31 MED ORDER — ONDANSETRON HCL 4 MG/2ML IJ SOLN
INTRAMUSCULAR | Status: AC
Start: 2015-07-31 — End: 2015-07-31
  Filled 2015-07-31: qty 2

## 2015-07-31 MED ORDER — PROPOFOL 10 MG/ML IV BOLUS
INTRAVENOUS | Status: DC | PRN
  Administered 2015-07-31: 200 mg via INTRAVENOUS

## 2015-07-31 MED ORDER — EPHEDRINE SULFATE 50 MG/ML IJ SOLN
INTRAMUSCULAR | Status: AC
  Filled 2015-07-31: qty 1

## 2015-07-31 MED ORDER — CEFAZOLIN SODIUM-DEXTROSE 2-3 GM-% IV SOLR
2.0000 g | INTRAVENOUS | Status: AC
  Administered 2015-07-31: 2 g via INTRAVENOUS

## 2015-07-31 MED ORDER — HYDROCODONE-ACETAMINOPHEN 5-325 MG PO TABS: 1.0000 | ORAL_TABLET | Freq: Four times a day (QID) | ORAL | Status: DC | PRN

## 2015-07-31 MED ORDER — HYDROCODONE-ACETAMINOPHEN 7.5-325 MG PO TABS: 1.0000 | ORAL_TABLET | Freq: Once | ORAL | Status: DC | PRN

## 2015-07-31 MED ORDER — PROMETHAZINE HCL 25 MG/ML IJ SOLN
6.2500 mg | INTRAMUSCULAR | Status: DC | PRN
Start: 2015-07-31 — End: 2015-07-31

## 2015-07-31 MED ORDER — LIDOCAINE HCL 1 % IJ SOLN
INTRAMUSCULAR | Status: DC | PRN
  Administered 2015-07-31: 10 mL via INTRADERMAL

## 2015-07-31 MED ORDER — HEPARIN (PORCINE) IN NACL 2-0.9 UNIT/ML-% IJ SOLN
INTRAMUSCULAR | Status: DC | PRN
  Administered 2015-07-31: 500 mL via INTRAVENOUS

## 2015-07-31 MED ORDER — MIDAZOLAM HCL 2 MG/2ML IJ SOLN
INTRAMUSCULAR | Status: AC
  Filled 2015-07-31: qty 2

## 2015-07-31 SURGICAL SUPPLY — 48 items
APL SKNCLS STERI-STRIP NONHPOA (GAUZE/BANDAGES/DRESSINGS) ×1
BAG DECANTER FOR FLEXI CONT (MISCELLANEOUS) ×2 IMPLANT
BENZOIN TINCTURE PRP APPL 2/3 (GAUZE/BANDAGES/DRESSINGS) ×2 IMPLANT
BLADE SURG 15 STRL LF DISP TIS (BLADE) ×1 IMPLANT
BLADE SURG 15 STRL SS (BLADE) ×2
CHLORAPREP W/TINT 26ML (MISCELLANEOUS) ×2 IMPLANT
CLEANER CAUTERY TIP 5X5 PAD (MISCELLANEOUS) ×1 IMPLANT
COVER BACK TABLE 60X90IN (DRAPES) ×2 IMPLANT
COVER MAYO STAND STRL (DRAPES) ×2 IMPLANT
COVER PROBE 5X48 (MISCELLANEOUS)
DECANTER SPIKE VIAL GLASS SM (MISCELLANEOUS) IMPLANT
DRAPE C-ARM 42X72 X-RAY (DRAPES) ×2 IMPLANT
DRAPE LAPAROTOMY T 102X78X121 (DRAPES) ×2 IMPLANT
DRAPE UTILITY XL STRL (DRAPES) ×2 IMPLANT
ELECT REM PT RETURN 9FT ADLT (ELECTROSURGICAL) ×2
ELECTRODE REM PT RTRN 9FT ADLT (ELECTROSURGICAL) ×1 IMPLANT
GLOVE BIO SURGEON STRL SZ7 (GLOVE) ×1 IMPLANT
GLOVE BIOGEL PI IND STRL 7.0 (GLOVE) IMPLANT
GLOVE BIOGEL PI INDICATOR 7.0 (GLOVE) ×1
GLOVE ECLIPSE 6.5 STRL STRAW (GLOVE) ×1 IMPLANT
GLOVE SURG SIGNA 7.5 PF LTX (GLOVE) ×2 IMPLANT
GOWN STRL REUS W/ TWL LRG LVL3 (GOWN DISPOSABLE) ×1 IMPLANT
GOWN STRL REUS W/ TWL XL LVL3 (GOWN DISPOSABLE) ×1 IMPLANT
GOWN STRL REUS W/TWL LRG LVL3 (GOWN DISPOSABLE) ×2
GOWN STRL REUS W/TWL XL LVL3 (GOWN DISPOSABLE) ×2
IV CATH AUTO 14GX1.75 SAFE ORG (IV SOLUTION) IMPLANT
IV CATH PLACEMENT UNIT 16 GA (IV SOLUTION) IMPLANT
IV KIT MINILOC 20X1 SAFETY (NEEDLE) IMPLANT
KIT CVR 48X5XPRB PLUP LF (MISCELLANEOUS) IMPLANT
KIT PORT POWER 8FR ISP CVUE (Catheter) ×1 IMPLANT
NDL HYPO 25X1 1.5 SAFETY (NEEDLE) ×1 IMPLANT
NEEDLE HYPO 25X1 1.5 SAFETY (NEEDLE) ×2 IMPLANT
PACK BASIN DAY SURGERY FS (CUSTOM PROCEDURE TRAY) ×2 IMPLANT
PAD CLEANER CAUTERY TIP 5X5 (MISCELLANEOUS) ×1
PENCIL BUTTON HOLSTER BLD 10FT (ELECTRODE) ×2 IMPLANT
SET SHEATH INTRODUCER 10FR (MISCELLANEOUS) IMPLANT
SHEATH COOK PEEL AWAY SET 9F (SHEATH) IMPLANT
SLEEVE SCD COMPRESS KNEE MED (MISCELLANEOUS) ×2 IMPLANT
SPONGE GAUZE 4X4 12PLY STER LF (GAUZE/BANDAGES/DRESSINGS) ×4 IMPLANT
STRIP CLOSURE SKIN 1/4X4 (GAUZE/BANDAGES/DRESSINGS) ×2 IMPLANT
SUT ETHILON 2 0 FS 18 (SUTURE) IMPLANT
SUT ETHILON 3 0 PS 1 (SUTURE) IMPLANT
SUT MON AB 5-0 PS2 18 (SUTURE) ×2 IMPLANT
SUT VICRYL 3-0 CR8 SH (SUTURE) ×2 IMPLANT
SYR 5ML LUER SLIP (SYRINGE) ×2 IMPLANT
SYR CONTROL 10ML LL (SYRINGE) ×2 IMPLANT
TOWEL OR 17X24 6PK STRL BLUE (TOWEL DISPOSABLE) ×2 IMPLANT
TOWEL OR NON WOVEN STRL DISP B (DISPOSABLE) ×2 IMPLANT

## 2015-07-31 NOTE — Discharge Instructions (Signed)
[facility] [place] [facility] - DISCHARGE INSTRUCTIONS TO PATIENT  Activity:  Driving - May drive tomorrow, if doing well   Lifting - Take it easy for 5 days, then no limit  Wound Care:   Leave incision dry for 2 days, then may shower  Diet:  As tolerated  Follow up appointment:  Call Dr. Pollie Friar office [facility]) at [phone] for an appointment in 2 months.  Medications and dosages:  Resume your home medications.  You have a prescription for:  Vicodin  Call Dr. Lucia Gaskins or his office  [phone]) if you have:  Temperature greater than 100.4,  Persistent nausea and vomiting,  Severe uncontrolled pain,  Redness, tenderness, or signs of infection (pain, swelling, redness, odor or green/yellow discharge around the site),  Difficulty breathing, headache or visual disturbances,  Any other questions or concerns you may have after discharge.  In an emergency, call 911 or go to an Emergency Department at a nearby hospital.    Post Anesthesia Home Care Instructions  Activity: Get plenty of rest for the remainder of the day. A responsible adult should stay with you for 24 hours following the procedure.  For the next 24 hours, DO NOT: -Drive a car -Paediatric nurse -Drink alcoholic beverages -Take any medication unless instructed by your physician -Make any legal decisions or sign important papers.  Meals: Start with liquid foods such as gelatin or soup. Progress to regular foods as tolerated. Avoid greasy, spicy, heavy foods. If nausea and/or vomiting occur, drink only clear liquids until the nausea and/or vomiting subsides. Call your physician if vomiting continues.  Special Instructions/Symptoms: Your throat may feel dry or sore from the anesthesia or the breathing tube placed in your throat during surgery. If this causes discomfort, gargle with warm salt water. The discomfort should disappear within 24 hours.  If you had a scopolamine patch placed behind your ear  for the management of post- operative nausea and/or vomiting:  1. The medication in the patch is effective for 72 hours, after which it should be removed.  Wrap patch in a tissue and discard in the trash. Wash hands thoroughly with soap and water. 2. You may remove the patch earlier than 72 hours if you experience unpleasant side effects which may include dry mouth, dizziness or visual disturbances. 3. Avoid touching the patch. Wash your hands with soap and water after contact with the patch.

## 2015-07-31 NOTE — Anesthesia Postprocedure Evaluation (Signed)
Anesthesia Post Note  Patient: [facility]  Procedure(s) Performed: Procedure(s) (LRB): INSERTION PORT-A-CATH (Right)  Patient location during evaluation: PACU Anesthesia Type: General Level of consciousness: awake and alert Pain management: pain level controlled Vital Signs Assessment: post-procedure vital signs reviewed and stable Respiratory status: spontaneous breathing, nonlabored ventilation, respiratory function stable and patient connected to nasal cannula oxygen Cardiovascular status: blood pressure returned to baseline and stable Postop Assessment: no signs of nausea or vomiting Anesthetic complications: no    Last Vitals:  Filed Vitals:   07/31/15 0945 07/31/15 1006  BP: 125/69 126/70  Pulse: 58 61  Temp:  36.5 C  Resp: 23 18    Last Pain:  Filed Vitals:   07/31/15 1007  PainSc: 0-No pain                 Tiajuana Amass

## 2015-07-31 NOTE — Anesthesia Preprocedure Evaluation (Addendum)
Anesthesia Evaluation  Patient identified by MRN, date of birth, ID band Patient awake    Reviewed: Allergy & Precautions, NPO status , Patient's Chart, lab work & pertinent test results  Airway Mallampati: III  TM Distance: >3 FB Neck ROM: Full    Dental   Pulmonary neg pulmonary ROS,    breath sounds clear to auscultation       Cardiovascular hypertension, Pt. on medications  Rhythm:Regular Rate:Normal     Neuro/Psych negative neurological ROS     GI/Hepatic negative GI ROS, Neg liver ROS,   Endo/Other  negative endocrine ROS  Renal/GU negative Renal ROS     Musculoskeletal   Abdominal   Peds  Hematology negative hematology ROS (+)   Anesthesia Other Findings   Reproductive/Obstetrics                            Lab Results  Component Value Date   WBC 11.4* 07/19/2015   HGB 11.7 07/19/2015   HCT 37.4 07/19/2015   MCV 77.1* 07/19/2015   PLT 326 07/19/2015   Lab Results  Component Value Date   CREATININE 0.9 07/19/2015   BUN 12.1 07/19/2015   NA 141 07/19/2015   K 3.9 07/19/2015   CO2 23 07/19/2015    Anesthesia Physical Anesthesia Plan  ASA: II  Anesthesia Plan: General   Post-op Pain Management:    Induction: Intravenous  Airway Management Planned: LMA  Additional Equipment:   Intra-op Plan:   Post-operative Plan: Extubation in OR  Informed Consent: I have reviewed the patients History and Physical, chart, labs and discussed the procedure including the risks, benefits and alternatives for the proposed anesthesia with the patient or authorized representative who has indicated his/her understanding and acceptance.   Dental advisory given  Plan Discussed with: CRNA  Anesthesia Plan Comments:         Anesthesia Quick Evaluation

## 2015-07-31 NOTE — Anesthesia Procedure Notes (Signed)
Procedure Name: LMA Insertion Performed by: Terrance Mass Pre-anesthesia Checklist: Patient identified, Emergency Drugs available, Suction available and Patient being monitored Patient Re-evaluated:Patient Re-evaluated prior to inductionOxygen Delivery Method: Circle System Utilized Preoxygenation: Pre-oxygenation with 100% oxygen Intubation Type: IV induction Ventilation: Mask ventilation without difficulty LMA: LMA inserted LMA Size: 4.0 Number of attempts: 1 Placement Confirmation: positive ETCO2 Tube secured with: Tape Dental Injury: Teeth and Oropharynx as per pre-operative assessment

## 2015-07-31 NOTE — Interval H&P Note (Signed)
History and Physical Interval Note:  07/31/2015 8:00 AM  [facility]  has presented today for surgery, with the diagnosis of LEFT BREAST CANCER  The various methods of treatment have been discussed with the patient and family.  She is here with her husband.  After consideration of risks, benefits and other options for treatment, the patient has consented to  Procedure(s): INSERTION PORT-A-CATH (N/A) as a surgical intervention .  The patient's history has been reviewed, patient examined, no change in status, stable for surgery.  I have reviewed the patient's chart and labs.  Questions were answered to the patient's satisfaction.     Carlas Vandyne H

## 2015-07-31 NOTE — Op Note (Signed)
07/31/2015  9:02 AM  PATIENT:  Emma [name], 50 y.o., female MRN: [id number] DOB: 1966/03/10  PREOP DIAGNOSIS:  LEFT BREAST CANCER, anticipate chemotherapy  POSTOP DIAGNOSIS:   Left breast cancer, anticipate chemotherapy  PROCEDURE:   Procedure(s):  Right subclavian INSERTION PORT-A-CATH  SURGEON:   Alphonsa Overall, M.D.  ANESTHESIA:   general  Anesthesiologist: Suzette Battiest, MD CRNA: Terrance Mass, CRNA  General  EBL:  minimal  ml  COUNTS CORRECT:  YES  INDICATIONS FOR PROCEDURE:  [name] is a 50 y.o. (DOB: 10/28/1965) AA female whose primary care physician is ANDY,CAMILLE L, MD and comes for power port placement for the treatment of left breast cancer cancer.  Dr. Jana Hakim is her treating oncologist.   The indications and risks of the surgery were explained to the patient.  The risks include, but are not limited to, infection, bleeding, pneumothorax, nerve injury, and thrombosis of the vein.  OPERATIVE NOTE:  The patient was taken to Room #6 at [facility].  Anesthesia was provided by Anesthesiologist: Suzette Battiest, MD CRNA: Terrance Mass, CRNA.  At the beginning of the operation, the patient was given 2 gm Ancef, had a roll placed under her back, and had the upper chest/neck prepped with Chloroprep and draped.   A time out was held and the surgery checklist reviewed.   The patient was placed in Trendelenburg position.  The right subclavian vein was accessed with a 16 gauge needle and a guide wire threaded through the needle into the vein.  The position of the wire was checked with fluoroscopy.   I then developed a pocket in the upper inner aspect of the right chest for the port reservoir.  I used the [employer]) [employer] for venous access.  The reservoir was sewn in place with a 3-0 Vicryl suture.  The reservoir had been flushed with dilute (10 units/cc) heparin.   I then passed the silastic tubing from the reservoir incision to the  subclavian stick site and used the 8 French introducer to pass it into the vein.  The tip of the silastic catheter was position at the junction of the SVC and the right atrium under fluoroscopy.  The silastic catheter was then attached to the port with the bayonet device.     The entire port and tubing were checked with fluoroscopy and then the port was flushed with 4 cc of concentrated heparin (100 units/cc).   The wounds were then closed with 3-0 vicryl subcutaneous sutures and the skin closed with a 4-0 Monocryl suture.  The skin was painted with LiquidBand.   The patient was transferred to the recovery room in good condition.  The sponge and needle count were correct at the end of the case.  A CXR is ordered for port placement and pending at the time of this note.  Alphonsa Overall, MD, [facility] Pager: [phone] Office phone:  [phone]

## 2015-07-31 NOTE — Transfer of Care (Signed)
Immediate Anesthesia Transfer of Care Note  Patient: [facility]  Procedure(s) Performed: Procedure(s): INSERTION PORT-A-CATH (Right)  Patient Location: PACU  Anesthesia Type:General  Level of Consciousness: awake and sedated  Airway & Oxygen Therapy: Patient Spontanous Breathing and Patient connected to face mask oxygen  Post-op Assessment: Report given to RN and Post -op Vital signs reviewed and stable  Post vital signs: Reviewed and stable  Last Vitals:  Filed Vitals:   07/31/15 0726  BP: 138/77  Pulse: 89  Temp: 36.9 C  Resp: 20    Complications: No apparent anesthesia complications

## 2015-08-01 ENCOUNTER — Encounter (HOSPITAL_BASED_OUTPATIENT_CLINIC_OR_DEPARTMENT_OTHER): Payer: Self-pay | Admitting: Surgery

## 2015-08-02 ENCOUNTER — Encounter: Payer: Self-pay | Admitting: Oncology

## 2015-08-02 NOTE — Progress Notes (Signed)
faxed to [phone] and copy for patient pk up 08/03/15

## 2015-08-03 ENCOUNTER — Other Ambulatory Visit: Payer: BC Managed Care – PPO

## 2015-08-03 ENCOUNTER — Ambulatory Visit (HOSPITAL_BASED_OUTPATIENT_CLINIC_OR_DEPARTMENT_OTHER): Payer: BC Managed Care – PPO | Admitting: Nurse Practitioner

## 2015-08-03 ENCOUNTER — Encounter: Payer: Self-pay | Admitting: Nurse Practitioner

## 2015-08-03 ENCOUNTER — Telehealth: Payer: Self-pay | Admitting: Oncology

## 2015-08-03 ENCOUNTER — Encounter: Payer: Self-pay | Admitting: *Deleted

## 2015-08-03 ENCOUNTER — Telehealth: Payer: Self-pay | Admitting: *Deleted

## 2015-08-03 VITALS — BP 150/75 | HR 80 | Temp 98.0°F | Resp 18 | Wt 210.5 lb

## 2015-08-03 DIAGNOSIS — Z170 Estrogen receptor positive status [ER+]: Secondary | ICD-10-CM

## 2015-08-03 DIAGNOSIS — C50412 Malignant neoplasm of upper-outer quadrant of left female breast: Secondary | ICD-10-CM | POA: Diagnosis not present

## 2015-08-03 DIAGNOSIS — C773 Secondary and unspecified malignant neoplasm of axilla and upper limb lymph nodes: Secondary | ICD-10-CM | POA: Diagnosis not present

## 2015-08-03 MED ORDER — PROCHLORPERAZINE MALEATE 10 MG PO TABS: 10.0000 mg | ORAL_TABLET | Freq: Four times a day (QID) | ORAL | Status: DC | PRN

## 2015-08-03 MED ORDER — DEXAMETHASONE 4 MG PO TABS: 8.0000 mg | ORAL_TABLET | Freq: Two times a day (BID) | ORAL | Status: DC

## 2015-08-03 MED ORDER — LIDOCAINE-PRILOCAINE 2.5-2.5 % EX CREA: TOPICAL_CREAM | CUTANEOUS | Status: DC

## 2015-08-03 MED ORDER — LORAZEPAM 0.5 MG PO TABS
0.5000 mg | ORAL_TABLET | Freq: Every evening | ORAL | Status: DC | PRN
Start: 2015-08-03 — End: 2015-11-22

## 2015-08-03 MED ORDER — ONDANSETRON HCL 8 MG PO TABS: 8.0000 mg | ORAL_TABLET | Freq: Two times a day (BID) | ORAL | Status: DC | PRN

## 2015-08-03 NOTE — Progress Notes (Signed)
[facility]  Telephone:(336) [phone] [phone]     ID: Emma [name] DOB: 28-Sep-1965  MR#: [id number]  DGU#:[id number]  Patient Care Team: [name], MD as PCP - General (Family Medicine) [name], MD as Consulting Physician (General Surgery) Lowella Dell, MD as Consulting Physician (Oncology) Antony Blackbird, MD as Consulting Physician (Radiation Oncology) Salomon Fick, NP as Nurse Practitioner (Hematology and Oncology) Levi Aland, MD as Consulting Physician (Obstetrics and Gynecology) PCP: [name], MD OTHER MD:  CHIEF COMPLAINT: Estrogen receptor positive breast cancer  CURRENT TREATMENT: Neoadjuvant chemotherapy   BREAST CANCER HISTORY: Emma [name] had routine screening mammography at Dr. Ewell Poe office/[facility] 06/23/2015. There was an area of distortion in the left breast. The patient [facility] where on 07/03/2015 he underwent left diagnostic mammography with tomosynthesis and left breast ultrasonography. The breast density was category B. In the far outer left breast there was an area of distortion measuring approximately 4 cm. There appeared to be a prominent left axillary lymph node. On physical exam at the 2:00 position of the left breast 12 cm from the nipple there was an area of approximately 4 cm of firmness. There was no palpable mass in the left axilla. Ultrasonography confirmed an irregular hypoechoic mass in the upper-outer quadrant of the left breast measuring 2.6 cm. A little closer to the axilla there was a second irregular hypoechoic mass measuring 0.5 cm. The distance between these 2 masses was 3.6 cm. In addition there was a right axillary lymph node with a thickened cortex. It measured 0.9 cm.  On 5 or 20 11/30/2015 the patient underwent biopsy of the 2 areas in the breast as well as the axillary lymph node. The larger of the 2 masses was an invasive lobular carcinoma,  E-cadherin negative, estrogen receptor 100% positive, progesterone receptor 100% positive, both with strong staining intensity, with an MIB-1 of 20%. This second, smaller mass, was also an invasive lobular carcinoma, E-cadherin negative, estrogen receptor 90% positive, progesterone receptor 100% positive, both with strong staining intensity, with an MIB-1 of 5%. The right axillary lymph node was also an invasive mammary carcinoma. And also estrogen receptor positive at 70%, progesterone receptor positive at 90%, with an MIB-1 of 10%.  Her subsequent history is as detailed below  INTERVAL HISTORY: Emma [name] returns for follow up of her breast cancer, accompanied by her husband [name]. She had a port placed earlier this week, and is here to learn about her antiemetic schedule. She is due to start treatment with docetaxel and cyclophoshpamide next week.   REVIEW OF SYSTEMS: The patient denies unusual headaches, visual changes, nausea, vomiting, stiff neck, dizziness, or gait imbalance. There has been no cough, phlegm production, or pleurisy, no chest pain or pressure, and no change in bowel or bladder habits. The patient denies fever, rash, bleeding, unexplained fatigue or unexplained weight loss. A detailed review of systems was otherwise entirely negative.  PAST MEDICAL HISTORY: Past Medical History  Diagnosis Date  . Hypertension   . Breast cancer of upper-outer quadrant of left female breast (HCC) 07/13/2015    PAST SURGICAL HISTORY: Past Surgical History  Procedure Laterality Date  . Cesarean section  1998  . Foot surgery  1993    bunionectomy  . Dilation and evacuation  05/01/2011    Procedure: DILATATION AND EVACUATION;  Surgeon: Levi Aland;  Location: [facility];  Service: Gynecology;  Laterality: N/A;  . Portacath placement Right 07/31/2015  [place]  Telephone:(336) [phone] [phone]) [phone]     ID: Emma [name] DOB: 1965-08-13  MR#: [id number]  UXL#:[id number]  Patient Care Team: Leamon Arnt, MD as PCP - General (Family Medicine) Alphonsa Overall, MD as Consulting Physician (General Surgery) Chauncey Cruel, MD as Consulting Physician (Oncology) Gery Pray, MD as Consulting Physician (Radiation Oncology) Sylvan Cheese, NP as Nurse Practitioner (Hematology and Oncology) Olga Millers, MD as Consulting Physician (Obstetrics and Gynecology) PCP: Leamon Arnt, MD OTHER MD:  CHIEF COMPLAINT: Estrogen receptor positive breast cancer  CURRENT TREATMENT: Neoadjuvant chemotherapy   BREAST CANCER HISTORY: Emma [name] had routine screening mammography at Dr. Tonette Bihari office/[facility] 06/23/2015. There was an area of distortion in the left breast. The patient was then referred to the [facility] where on 07/03/2015 he underwent left diagnostic mammography with tomosynthesis and left breast ultrasonography. The breast density was category B. In the far outer left breast there was an area of distortion measuring approximately 4 cm. There appeared to be a prominent left axillary lymph node. On physical exam at the 2:00 position of the left breast 12 cm from the nipple there was an area of approximately 4 cm of firmness. There was no palpable mass in the left axilla. Ultrasonography confirmed an irregular hypoechoic mass in the upper-outer quadrant of the left breast measuring 2.6 cm. A little closer to the axilla there was a second irregular hypoechoic mass measuring 0.5 cm. The distance between these 2 masses was 3.6 cm. In addition there was a right axillary lymph node with a thickened cortex. It measured 0.9 cm.  On 5 or 20 11/30/2015 the patient underwent biopsy of the 2 areas in the breast as well as the axillary lymph node. The larger of the 2 masses was an invasive lobular carcinoma,  E-cadherin negative, estrogen receptor 100% positive, progesterone receptor 100% positive, both with strong staining intensity, with an MIB-1 of 20%. This second, smaller mass, was also an invasive lobular carcinoma, E-cadherin negative, estrogen receptor 90% positive, progesterone receptor 100% positive, both with strong staining intensity, with an MIB-1 of 5%. The right axillary lymph node was also an invasive mammary carcinoma. And also estrogen receptor positive at 70%, progesterone receptor positive at 90%, with an MIB-1 of 10%.  Her subsequent history is as detailed below  INTERVAL HISTORY: Emma [name] returns for follow up of her breast cancer, accompanied by her husband Mliss Fritz. She had a port placed earlier this week, and is here to learn about her antiemetic schedule. She is due to start treatment with docetaxel and cyclophoshpamide next week.   REVIEW OF SYSTEMS: The patient denies unusual headaches, visual changes, nausea, vomiting, stiff neck, dizziness, or gait imbalance. There has been no cough, phlegm production, or pleurisy, no chest pain or pressure, and no change in bowel or bladder habits. The patient denies fever, rash, bleeding, unexplained fatigue or unexplained weight loss. A detailed review of systems was otherwise entirely negative.  PAST MEDICAL HISTORY: Past Medical History  Diagnosis Date  . Hypertension   . Breast cancer of upper-outer quadrant of left female breast ([place]) 07/13/2015    PAST SURGICAL HISTORY: Past Surgical History  Procedure Laterality Date  . Cesarean section  1998  . Foot surgery  1993    bunionectomy  . Dilation and evacuation  05/01/2011    Procedure: DILATATION AND EVACUATION;  Surgeon: Olga Millers;  Location: [facility];  Service: Gynecology;  Laterality: N/A;  . Portacath placement Right 07/31/2015  [place]  Telephone:(336) [phone] Fax:([phone]) [phone]     ID: Emma [name] DOB: 1965-08-13  MR#: [id number]  UXL#:[id number]  Patient Care Team: Leamon Arnt, MD as PCP - General (Family Medicine) Alphonsa Overall, MD as Consulting Physician (General Surgery) Chauncey Cruel, MD as Consulting Physician (Oncology) Gery Pray, MD as Consulting Physician (Radiation Oncology) Sylvan Cheese, NP as Nurse Practitioner (Hematology and Oncology) Olga Millers, MD as Consulting Physician (Obstetrics and Gynecology) PCP: Leamon Arnt, MD OTHER MD:  CHIEF COMPLAINT: Estrogen receptor positive breast cancer  CURRENT TREATMENT: Neoadjuvant chemotherapy   BREAST CANCER HISTORY: Emma [name] had routine screening mammography at Dr. Tonette Bihari office/[facility] 06/23/2015. There was an area of distortion in the left breast. The patient was then referred to the [facility] where on 07/03/2015 he underwent left diagnostic mammography with tomosynthesis and left breast ultrasonography. The breast density was category B. In the far outer left breast there was an area of distortion measuring approximately 4 cm. There appeared to be a prominent left axillary lymph node. On physical exam at the 2:00 position of the left breast 12 cm from the nipple there was an area of approximately 4 cm of firmness. There was no palpable mass in the left axilla. Ultrasonography confirmed an irregular hypoechoic mass in the upper-outer quadrant of the left breast measuring 2.6 cm. A little closer to the axilla there was a second irregular hypoechoic mass measuring 0.5 cm. The distance between these 2 masses was 3.6 cm. In addition there was a right axillary lymph node with a thickened cortex. It measured 0.9 cm.  On 5 or 20 11/30/2015 the patient underwent biopsy of the 2 areas in the breast as well as the axillary lymph node. The larger of the 2 masses was an invasive lobular carcinoma,  E-cadherin negative, estrogen receptor 100% positive, progesterone receptor 100% positive, both with strong staining intensity, with an MIB-1 of 20%. This second, smaller mass, was also an invasive lobular carcinoma, E-cadherin negative, estrogen receptor 90% positive, progesterone receptor 100% positive, both with strong staining intensity, with an MIB-1 of 5%. The right axillary lymph node was also an invasive mammary carcinoma. And also estrogen receptor positive at 70%, progesterone receptor positive at 90%, with an MIB-1 of 10%.  Her subsequent history is as detailed below  INTERVAL HISTORY: Emma [name] returns for follow up of her breast cancer, accompanied by her husband Mliss Fritz. She had a port placed earlier this week, and is here to learn about her antiemetic schedule. She is due to start treatment with docetaxel and cyclophoshpamide next week.   REVIEW OF SYSTEMS: The patient denies unusual headaches, visual changes, nausea, vomiting, stiff neck, dizziness, or gait imbalance. There has been no cough, phlegm production, or pleurisy, no chest pain or pressure, and no change in bowel or bladder habits. The patient denies fever, rash, bleeding, unexplained fatigue or unexplained weight loss. A detailed review of systems was otherwise entirely negative.  PAST MEDICAL HISTORY: Past Medical History  Diagnosis Date  . Hypertension   . Breast cancer of upper-outer quadrant of left female breast ([place]) 07/13/2015    PAST SURGICAL HISTORY: Past Surgical History  Procedure Laterality Date  . Cesarean section  1998  . Foot surgery  1993    bunionectomy  . Dilation and evacuation  05/01/2011    Procedure: DILATATION AND EVACUATION;  Surgeon: Olga Millers;  Location: [facility];  Service: Gynecology;  Laterality: N/A;  . Portacath placement Right 07/31/2015  [place]  Telephone:(336) [phone] Fax:([phone]) [phone]     ID: Emma [name] DOB: 1965-08-13  MR#: [id number]  UXL#:[id number]  Patient Care Team: Leamon Arnt, MD as PCP - General (Family Medicine) Alphonsa Overall, MD as Consulting Physician (General Surgery) Chauncey Cruel, MD as Consulting Physician (Oncology) Gery Pray, MD as Consulting Physician (Radiation Oncology) Sylvan Cheese, NP as Nurse Practitioner (Hematology and Oncology) Olga Millers, MD as Consulting Physician (Obstetrics and Gynecology) PCP: Leamon Arnt, MD OTHER MD:  CHIEF COMPLAINT: Estrogen receptor positive breast cancer  CURRENT TREATMENT: Neoadjuvant chemotherapy   BREAST CANCER HISTORY: Emma [name] had routine screening mammography at Dr. Tonette Bihari office/[facility] 06/23/2015. There was an area of distortion in the left breast. The patient was then referred to the [facility] where on 07/03/2015 he underwent left diagnostic mammography with tomosynthesis and left breast ultrasonography. The breast density was category B. In the far outer left breast there was an area of distortion measuring approximately 4 cm. There appeared to be a prominent left axillary lymph node. On physical exam at the 2:00 position of the left breast 12 cm from the nipple there was an area of approximately 4 cm of firmness. There was no palpable mass in the left axilla. Ultrasonography confirmed an irregular hypoechoic mass in the upper-outer quadrant of the left breast measuring 2.6 cm. A little closer to the axilla there was a second irregular hypoechoic mass measuring 0.5 cm. The distance between these 2 masses was 3.6 cm. In addition there was a right axillary lymph node with a thickened cortex. It measured 0.9 cm.  On 5 or 20 11/30/2015 the patient underwent biopsy of the 2 areas in the breast as well as the axillary lymph node. The larger of the 2 masses was an invasive lobular carcinoma,  E-cadherin negative, estrogen receptor 100% positive, progesterone receptor 100% positive, both with strong staining intensity, with an MIB-1 of 20%. This second, smaller mass, was also an invasive lobular carcinoma, E-cadherin negative, estrogen receptor 90% positive, progesterone receptor 100% positive, both with strong staining intensity, with an MIB-1 of 5%. The right axillary lymph node was also an invasive mammary carcinoma. And also estrogen receptor positive at 70%, progesterone receptor positive at 90%, with an MIB-1 of 10%.  Her subsequent history is as detailed below  INTERVAL HISTORY: Emma [name] returns for follow up of her breast cancer, accompanied by her husband Mliss Fritz. She had a port placed earlier this week, and is here to learn about her antiemetic schedule. She is due to start treatment with docetaxel and cyclophoshpamide next week.   REVIEW OF SYSTEMS: The patient denies unusual headaches, visual changes, nausea, vomiting, stiff neck, dizziness, or gait imbalance. There has been no cough, phlegm production, or pleurisy, no chest pain or pressure, and no change in bowel or bladder habits. The patient denies fever, rash, bleeding, unexplained fatigue or unexplained weight loss. A detailed review of systems was otherwise entirely negative.  PAST MEDICAL HISTORY: Past Medical History  Diagnosis Date  . Hypertension   . Breast cancer of upper-outer quadrant of left female breast ([place]) 07/13/2015    PAST SURGICAL HISTORY: Past Surgical History  Procedure Laterality Date  . Cesarean section  1998  . Foot surgery  1993    bunionectomy  . Dilation and evacuation  05/01/2011    Procedure: DILATATION AND EVACUATION;  Surgeon: Olga Millers;  Location: [facility];  Service: Gynecology;  Laterality: N/A;  . Portacath placement Right 07/31/2015  [place]  Telephone:(336) [phone] Fax:([phone]) [phone]     ID: Emma [name] DOB: 1965-08-13  MR#: [id number]  UXL#:[id number]  Patient Care Team: Leamon Arnt, MD as PCP - General (Family Medicine) Alphonsa Overall, MD as Consulting Physician (General Surgery) Chauncey Cruel, MD as Consulting Physician (Oncology) Gery Pray, MD as Consulting Physician (Radiation Oncology) Sylvan Cheese, NP as Nurse Practitioner (Hematology and Oncology) Olga Millers, MD as Consulting Physician (Obstetrics and Gynecology) PCP: Leamon Arnt, MD OTHER MD:  CHIEF COMPLAINT: Estrogen receptor positive breast cancer  CURRENT TREATMENT: Neoadjuvant chemotherapy   BREAST CANCER HISTORY: Emma [name] had routine screening mammography at Dr. Tonette Bihari office/[facility] 06/23/2015. There was an area of distortion in the left breast. The patient was then referred to the [facility] where on 07/03/2015 he underwent left diagnostic mammography with tomosynthesis and left breast ultrasonography. The breast density was category B. In the far outer left breast there was an area of distortion measuring approximately 4 cm. There appeared to be a prominent left axillary lymph node. On physical exam at the 2:00 position of the left breast 12 cm from the nipple there was an area of approximately 4 cm of firmness. There was no palpable mass in the left axilla. Ultrasonography confirmed an irregular hypoechoic mass in the upper-outer quadrant of the left breast measuring 2.6 cm. A little closer to the axilla there was a second irregular hypoechoic mass measuring 0.5 cm. The distance between these 2 masses was 3.6 cm. In addition there was a right axillary lymph node with a thickened cortex. It measured 0.9 cm.  On 5 or 20 11/30/2015 the patient underwent biopsy of the 2 areas in the breast as well as the axillary lymph node. The larger of the 2 masses was an invasive lobular carcinoma,  E-cadherin negative, estrogen receptor 100% positive, progesterone receptor 100% positive, both with strong staining intensity, with an MIB-1 of 20%. This second, smaller mass, was also an invasive lobular carcinoma, E-cadherin negative, estrogen receptor 90% positive, progesterone receptor 100% positive, both with strong staining intensity, with an MIB-1 of 5%. The right axillary lymph node was also an invasive mammary carcinoma. And also estrogen receptor positive at 70%, progesterone receptor positive at 90%, with an MIB-1 of 10%.  Her subsequent history is as detailed below  INTERVAL HISTORY: Emma [name] returns for follow up of her breast cancer, accompanied by her husband Mliss Fritz. She had a port placed earlier this week, and is here to learn about her antiemetic schedule. She is due to start treatment with docetaxel and cyclophoshpamide next week.   REVIEW OF SYSTEMS: The patient denies unusual headaches, visual changes, nausea, vomiting, stiff neck, dizziness, or gait imbalance. There has been no cough, phlegm production, or pleurisy, no chest pain or pressure, and no change in bowel or bladder habits. The patient denies fever, rash, bleeding, unexplained fatigue or unexplained weight loss. A detailed review of systems was otherwise entirely negative.  PAST MEDICAL HISTORY: Past Medical History  Diagnosis Date  . Hypertension   . Breast cancer of upper-outer quadrant of left female breast ([place]) 07/13/2015    PAST SURGICAL HISTORY: Past Surgical History  Procedure Laterality Date  . Cesarean section  1998  . Foot surgery  1993    bunionectomy  . Dilation and evacuation  05/01/2011    Procedure: DILATATION AND EVACUATION;  Surgeon: Olga Millers;  Location: [facility];  Service: Gynecology;  Laterality: N/A;  . Portacath placement Right 07/31/2015  [place]  Telephone:(336) [phone] Fax:([phone]) [phone]     ID: Emma [name] DOB: 1965-08-13  MR#: [id number]  UXL#:[id number]  Patient Care Team: Leamon Arnt, MD as PCP - General (Family Medicine) Alphonsa Overall, MD as Consulting Physician (General Surgery) Chauncey Cruel, MD as Consulting Physician (Oncology) Gery Pray, MD as Consulting Physician (Radiation Oncology) Sylvan Cheese, NP as Nurse Practitioner (Hematology and Oncology) Olga Millers, MD as Consulting Physician (Obstetrics and Gynecology) PCP: Leamon Arnt, MD OTHER MD:  CHIEF COMPLAINT: Estrogen receptor positive breast cancer  CURRENT TREATMENT: Neoadjuvant chemotherapy   BREAST CANCER HISTORY: Emma [name] had routine screening mammography at Dr. Tonette Bihari office/[facility] 06/23/2015. There was an area of distortion in the left breast. The patient was then referred to the [facility] where on 07/03/2015 he underwent left diagnostic mammography with tomosynthesis and left breast ultrasonography. The breast density was category B. In the far outer left breast there was an area of distortion measuring approximately 4 cm. There appeared to be a prominent left axillary lymph node. On physical exam at the 2:00 position of the left breast 12 cm from the nipple there was an area of approximately 4 cm of firmness. There was no palpable mass in the left axilla. Ultrasonography confirmed an irregular hypoechoic mass in the upper-outer quadrant of the left breast measuring 2.6 cm. A little closer to the axilla there was a second irregular hypoechoic mass measuring 0.5 cm. The distance between these 2 masses was 3.6 cm. In addition there was a right axillary lymph node with a thickened cortex. It measured 0.9 cm.  On 5 or 20 11/30/2015 the patient underwent biopsy of the 2 areas in the breast as well as the axillary lymph node. The larger of the 2 masses was an invasive lobular carcinoma,  E-cadherin negative, estrogen receptor 100% positive, progesterone receptor 100% positive, both with strong staining intensity, with an MIB-1 of 20%. This second, smaller mass, was also an invasive lobular carcinoma, E-cadherin negative, estrogen receptor 90% positive, progesterone receptor 100% positive, both with strong staining intensity, with an MIB-1 of 5%. The right axillary lymph node was also an invasive mammary carcinoma. And also estrogen receptor positive at 70%, progesterone receptor positive at 90%, with an MIB-1 of 10%.  Her subsequent history is as detailed below  INTERVAL HISTORY: Emma [name] returns for follow up of her breast cancer, accompanied by her husband Mliss Fritz. She had a port placed earlier this week, and is here to learn about her antiemetic schedule. She is due to start treatment with docetaxel and cyclophoshpamide next week.   REVIEW OF SYSTEMS: The patient denies unusual headaches, visual changes, nausea, vomiting, stiff neck, dizziness, or gait imbalance. There has been no cough, phlegm production, or pleurisy, no chest pain or pressure, and no change in bowel or bladder habits. The patient denies fever, rash, bleeding, unexplained fatigue or unexplained weight loss. A detailed review of systems was otherwise entirely negative.  PAST MEDICAL HISTORY: Past Medical History  Diagnosis Date  . Hypertension   . Breast cancer of upper-outer quadrant of left female breast ([place]) 07/13/2015    PAST SURGICAL HISTORY: Past Surgical History  Procedure Laterality Date  . Cesarean section  1998  . Foot surgery  1993    bunionectomy  . Dilation and evacuation  05/01/2011    Procedure: DILATATION AND EVACUATION;  Surgeon: Olga Millers;  Location: [facility];  Service: Gynecology;  Laterality: N/A;  . Portacath placement Right 07/31/2015  [place]  Telephone:(336) [phone] Fax:([phone]) [phone]     ID: Emma [name] DOB: 1965-08-13  MR#: [id number]  UXL#:[id number]  Patient Care Team: Leamon Arnt, MD as PCP - General (Family Medicine) Alphonsa Overall, MD as Consulting Physician (General Surgery) Chauncey Cruel, MD as Consulting Physician (Oncology) Gery Pray, MD as Consulting Physician (Radiation Oncology) Sylvan Cheese, NP as Nurse Practitioner (Hematology and Oncology) Olga Millers, MD as Consulting Physician (Obstetrics and Gynecology) PCP: Leamon Arnt, MD OTHER MD:  CHIEF COMPLAINT: Estrogen receptor positive breast cancer  CURRENT TREATMENT: Neoadjuvant chemotherapy   BREAST CANCER HISTORY: Emma [name] had routine screening mammography at Dr. Tonette Bihari office/[facility] 06/23/2015. There was an area of distortion in the left breast. The patient was then referred to the [facility] where on 07/03/2015 he underwent left diagnostic mammography with tomosynthesis and left breast ultrasonography. The breast density was category B. In the far outer left breast there was an area of distortion measuring approximately 4 cm. There appeared to be a prominent left axillary lymph node. On physical exam at the 2:00 position of the left breast 12 cm from the nipple there was an area of approximately 4 cm of firmness. There was no palpable mass in the left axilla. Ultrasonography confirmed an irregular hypoechoic mass in the upper-outer quadrant of the left breast measuring 2.6 cm. A little closer to the axilla there was a second irregular hypoechoic mass measuring 0.5 cm. The distance between these 2 masses was 3.6 cm. In addition there was a right axillary lymph node with a thickened cortex. It measured 0.9 cm.  On 5 or 20 11/30/2015 the patient underwent biopsy of the 2 areas in the breast as well as the axillary lymph node. The larger of the 2 masses was an invasive lobular carcinoma,  E-cadherin negative, estrogen receptor 100% positive, progesterone receptor 100% positive, both with strong staining intensity, with an MIB-1 of 20%. This second, smaller mass, was also an invasive lobular carcinoma, E-cadherin negative, estrogen receptor 90% positive, progesterone receptor 100% positive, both with strong staining intensity, with an MIB-1 of 5%. The right axillary lymph node was also an invasive mammary carcinoma. And also estrogen receptor positive at 70%, progesterone receptor positive at 90%, with an MIB-1 of 10%.  Her subsequent history is as detailed below  INTERVAL HISTORY: Emma [name] returns for follow up of her breast cancer, accompanied by her husband Mliss Fritz. She had a port placed earlier this week, and is here to learn about her antiemetic schedule. She is due to start treatment with docetaxel and cyclophoshpamide next week.   REVIEW OF SYSTEMS: The patient denies unusual headaches, visual changes, nausea, vomiting, stiff neck, dizziness, or gait imbalance. There has been no cough, phlegm production, or pleurisy, no chest pain or pressure, and no change in bowel or bladder habits. The patient denies fever, rash, bleeding, unexplained fatigue or unexplained weight loss. A detailed review of systems was otherwise entirely negative.  PAST MEDICAL HISTORY: Past Medical History  Diagnosis Date  . Hypertension   . Breast cancer of upper-outer quadrant of left female breast ([facility]) 07/13/2015    PAST SURGICAL HISTORY: Past Surgical History  Procedure Laterality Date  . Cesarean section  1998  . Foot surgery  1993    bunionectomy  . Dilation and evacuation  05/01/2011    Procedure: DILATATION AND EVACUATION;  Surgeon: Olga Millers;  Location: [facility];  Service: Gynecology;  Laterality: N/A;  . Portacath placement Right 07/31/2015  [facility]  [phone] [phone]     ID: Emma [name] DOB: 1965-08-13  MR#: [id number]  UXL#:[id number]  Patient Care Team: Leamon Arnt, MD as PCP - General (Family Medicine) Alphonsa Overall, MD as Consulting Physician (General Surgery) Chauncey Cruel, MD as Consulting Physician (Oncology) Gery Pray, MD as Consulting Physician (Radiation Oncology) Sylvan Cheese, NP as Nurse Practitioner (Hematology and Oncology) Olga Millers, MD as Consulting Physician (Obstetrics and Gynecology) PCP: Leamon Arnt, MD OTHER MD:  CHIEF COMPLAINT: Estrogen receptor positive breast cancer  CURRENT TREATMENT: Neoadjuvant chemotherapy   BREAST CANCER HISTORY: Emma [name] had routine screening mammography at Dr. Tonette Bihari office/[facility] 06/23/2015. There was an area of distortion in the left breast. The patient was then referred to the [facility] where on 07/03/2015 he underwent left diagnostic mammography with tomosynthesis and left breast ultrasonography. The breast density was category B. In the far outer left breast there was an area of distortion measuring approximately 4 cm. There appeared to be a prominent left axillary lymph node. On physical exam at the 2:00 position of the left breast 12 cm from the nipple there was an area of approximately 4 cm of firmness. There was no palpable mass in the left axilla. Ultrasonography confirmed an irregular hypoechoic mass in the upper-outer quadrant of the left breast measuring 2.6 cm. A little closer to the axilla there was a second irregular hypoechoic mass measuring 0.5 cm. The distance between these 2 masses was 3.6 cm. In addition there was a right axillary lymph node with a thickened cortex. It measured 0.9 cm.  On 5 or 20 11/30/2015 the patient underwent biopsy of the 2 areas in the breast as well as the axillary lymph node. The larger of the 2 masses was an invasive lobular carcinoma,  E-cadherin negative, estrogen receptor 100% positive, progesterone receptor 100% positive, both with strong staining intensity, with an MIB-1 of 20%. This second, smaller mass, was also an invasive lobular carcinoma, E-cadherin negative, estrogen receptor 90% positive, progesterone receptor 100% positive, both with strong staining intensity, with an MIB-1 of 5%. The right axillary lymph node was also an invasive mammary carcinoma. And also estrogen receptor positive at 70%, progesterone receptor positive at 90%, with an MIB-1 of 10%.  Her subsequent history is as detailed below  INTERVAL HISTORY: Emma [name] returns for follow up of her breast cancer, accompanied by her husband Mliss Fritz. She had a port placed earlier this week, and is here to learn about her antiemetic schedule. She is due to start treatment with docetaxel and cyclophoshpamide next week.   REVIEW OF SYSTEMS: The patient denies unusual headaches, visual changes, nausea, vomiting, stiff neck, dizziness, or gait imbalance. There has been no cough, phlegm production, or pleurisy, no chest pain or pressure, and no change in bowel or bladder habits. The patient denies fever, rash, bleeding, unexplained fatigue or unexplained weight loss. A detailed review of systems was otherwise entirely negative.  PAST MEDICAL HISTORY: Past Medical History  Diagnosis Date  . Hypertension   . Breast cancer of upper-outer quadrant of left female breast ([facility]) 07/13/2015    PAST SURGICAL HISTORY: Past Surgical History  Procedure Laterality Date  . Cesarean section  1998  . Foot surgery  1993    bunionectomy  . Dilation and evacuation  05/01/2011    Procedure: DILATATION AND EVACUATION;  Surgeon: Olga Millers;  Location: [facility];  Service: Gynecology;  Laterality: N/A;  . Portacath placement Right 07/31/2015

## 2015-08-03 NOTE — Telephone Encounter (Signed)
Called pt's home number and cell number. No answer but left a detailed message to inquire about appt for today. Pt has not shown for appt as of yet. I asked her to call this nurse [phone] concerning her appts for today. Message to be fwd to H.Boelter,NP.

## 2015-08-03 NOTE — Telephone Encounter (Signed)
Gave patient avs report and appointments for March thru May. Per inj appointments cxd. Per patient she will go home with patch and not return for injection.

## 2015-08-04 ENCOUNTER — Other Ambulatory Visit: Payer: Self-pay

## 2015-08-04 DIAGNOSIS — C50412 Malignant neoplasm of upper-outer quadrant of left female breast: Secondary | ICD-10-CM

## 2015-08-07 ENCOUNTER — Other Ambulatory Visit (HOSPITAL_BASED_OUTPATIENT_CLINIC_OR_DEPARTMENT_OTHER): Payer: BC Managed Care – PPO

## 2015-08-07 ENCOUNTER — Other Ambulatory Visit: Payer: Self-pay | Admitting: Nurse Practitioner

## 2015-08-07 ENCOUNTER — Other Ambulatory Visit: Payer: Self-pay | Admitting: Oncology

## 2015-08-07 ENCOUNTER — Ambulatory Visit (HOSPITAL_BASED_OUTPATIENT_CLINIC_OR_DEPARTMENT_OTHER): Payer: BC Managed Care – PPO

## 2015-08-07 ENCOUNTER — Encounter: Payer: Self-pay | Admitting: *Deleted

## 2015-08-07 VITALS — BP 125/68 | HR 77 | Temp 97.6°F | Resp 18

## 2015-08-07 DIAGNOSIS — C50412 Malignant neoplasm of upper-outer quadrant of left female breast: Secondary | ICD-10-CM

## 2015-08-07 DIAGNOSIS — Z5189 Encounter for other specified aftercare: Secondary | ICD-10-CM | POA: Diagnosis not present

## 2015-08-07 DIAGNOSIS — Z5111 Encounter for antineoplastic chemotherapy: Secondary | ICD-10-CM | POA: Diagnosis not present

## 2015-08-07 LAB — CBC WITH DIFFERENTIAL/PLATELET
BASO%: 0.7 % (ref 0.0–2.0)
BASOS ABS: 0.2 10*3/uL — AB (ref 0.0–0.1)
EOS%: 0 % (ref 0.0–7.0)
Eosinophils Absolute: 0 10*3/uL (ref 0.0–0.5)
HCT: 36.6 % (ref 34.8–46.6)
HEMOGLOBIN: 11.5 g/dL — AB (ref 11.6–15.9)
LYMPH%: 9.6 % — ABNORMAL LOW (ref 14.0–49.7)
MCH: 23.7 pg — AB (ref 25.1–34.0)
MCHC: 31.6 g/dL (ref 31.5–36.0)
MCV: 75.1 fL — AB (ref 79.5–101.0)
MONO#: 0.6 10*3/uL (ref 0.1–0.9)
MONO%: 2.6 % (ref 0.0–14.0)
NEUT%: 87.1 % — ABNORMAL HIGH (ref 38.4–76.8)
NEUTROS ABS: 19 10*3/uL — AB (ref 1.5–6.5)
Platelets: 339 10*3/uL (ref 145–400)
RBC: 4.87 10*6/uL (ref 3.70–5.45)
RDW: 18.4 % — AB (ref 11.2–14.5)
WBC: 21.8 10*3/uL — AB (ref 3.9–10.3)
lymph#: 2.1 10*3/uL (ref 0.9–3.3)

## 2015-08-07 LAB — COMPREHENSIVE METABOLIC PANEL
ALBUMIN: 3.6 g/dL (ref 3.5–5.0)
ALK PHOS: 57 U/L (ref 40–150)
ALT: <9 U/L (ref 0–55)
AST: 12 U/L (ref 5–34)
Anion Gap: 9 mEq/L (ref 3–11)
BUN: 15.7 mg/dL (ref 7.0–26.0)
CO2: 22 meq/L (ref 22–29)
Calcium: 9.4 mg/dL (ref 8.4–10.4)
Chloride: 107 mEq/L (ref 98–109)
Creatinine: 1 mg/dL (ref 0.6–1.1)
EGFR: 79 mL/min/{1.73_m2} — AB (ref >90)
GLUCOSE: 157 mg/dL — AB (ref 70–140)
POTASSIUM: 3.7 meq/L (ref 3.5–5.1)
SODIUM: 138 meq/L (ref 136–145)
Total Bilirubin: 0.43 mg/dL (ref 0.20–1.20)
Total Protein: 8 g/dL (ref 6.4–8.3)

## 2015-08-07 MED ORDER — SODIUM CHLORIDE 0.9% FLUSH
10.0000 mL | INTRAVENOUS | Status: DC | PRN
  Administered 2015-08-07: 10 mL
  Filled 2015-08-07: qty 10

## 2015-08-07 MED ORDER — SODIUM CHLORIDE 0.9 % IV SOLN
75.0000 mg/m2 | Freq: Once | INTRAVENOUS | Status: AC
  Administered 2015-08-07: 160 mg via INTRAVENOUS
  Filled 2015-08-07: qty 16

## 2015-08-07 MED ORDER — SODIUM CHLORIDE 0.9 % IV SOLN
600.0000 mg/m2 | Freq: Once | INTRAVENOUS | Status: AC
  Administered 2015-08-07: 1260 mg via INTRAVENOUS
  Filled 2015-08-07: qty 63

## 2015-08-07 MED ORDER — SODIUM CHLORIDE 0.9 % IV SOLN
10.0000 mg | Freq: Once | INTRAVENOUS | Status: AC
  Administered 2015-08-07: 10 mg via INTRAVENOUS
  Filled 2015-08-07: qty 1

## 2015-08-07 MED ORDER — HEPARIN SOD (PORK) LOCK FLUSH 100 UNIT/ML IV SOLN
500.0000 [IU] | Freq: Once | INTRAVENOUS | Status: AC | PRN
  Administered 2015-08-07: 500 [IU]
  Filled 2015-08-07: qty 5

## 2015-08-07 MED ORDER — PALONOSETRON HCL INJECTION 0.25 MG/5ML
0.2500 mg | Freq: Once | INTRAVENOUS | Status: AC
  Administered 2015-08-07: 0.25 mg via INTRAVENOUS

## 2015-08-07 MED ORDER — PEGFILGRASTIM 6 MG/0.6ML ~~LOC~~ PSKT
6.0000 mg | PREFILLED_SYRINGE | Freq: Once | SUBCUTANEOUS | Status: AC
  Administered 2015-08-07: 6 mg via SUBCUTANEOUS
  Filled 2015-08-07: qty 0.6

## 2015-08-07 MED ORDER — SODIUM CHLORIDE 0.9 % IV SOLN
Freq: Once | INTRAVENOUS | Status: AC
  Administered 2015-08-07: 14:00:00 via INTRAVENOUS

## 2015-08-07 MED ORDER — PALONOSETRON HCL INJECTION 0.25 MG/5ML
INTRAVENOUS | Status: AC
  Filled 2015-08-07: qty 5

## 2015-08-07 NOTE — Progress Notes (Signed)
Pt tolerated first time Taxotere/Cytoxan. No reactions or complications reported. Pt follow up phone call placed in in basket. Reviewed possible side effects of chemo and neulasta. Pt verbalized understanding. Reviewed pt medication for nausea for home management.

## 2015-08-07 NOTE — Patient Instructions (Signed)
[facility] Discharge Instructions for Patients Receiving Chemotherapy  Today you received the following chemotherapy agents taxotere/ carboplatin  To help prevent nausea and vomiting after your treatment, we encourage you to take your nausea medication as directed by your MD.    If you develop nausea and vomiting that is not controlled by your nausea medication, call the clinic.   BELOW ARE SYMPTOMS THAT SHOULD BE REPORTED IMMEDIATELY:  *FEVER GREATER THAN 100.5 F  *CHILLS WITH OR WITHOUT FEVER  NAUSEA AND VOMITING THAT IS NOT CONTROLLED WITH YOUR NAUSEA MEDICATION  *UNUSUAL SHORTNESS OF BREATH  *UNUSUAL BRUISING OR BLEEDING  TENDERNESS IN MOUTH AND THROAT WITH OR WITHOUT PRESENCE OF ULCERS  *URINARY PROBLEMS  *BOWEL PROBLEMS  UNUSUAL RASH Items with * indicate a potential emergency and should be followed up as soon as possible.  Feel free to call the clinic you have any questions or concerns. The clinic phone number is [phone].  Please show the [facility] at check-in to the Emergency Department and triage nurse.  Docetaxel injection (Taxotere)  What is this medicine? DOCETAXEL (doe se TAX el) is a chemotherapy drug. It targets fast dividing cells, like cancer cells, and causes these cells to die. This medicine is used to treat many types of cancers like breast cancer, certain stomach cancers, head and neck cancer, lung cancer, and prostate cancer. This medicine may be used for other purposes; ask your health care provider or pharmacist if you have questions. What should I tell my health care provider before I take this medicine? They need to know if you have any of these conditions: -infection (especially a virus infection such as chickenpox, cold sores, or herpes) -liver disease -low blood counts, like low white cell, platelet, or red cell counts -an unusual or allergic reaction to docetaxel, polysorbate 80, other chemotherapy agents, other  medicines, foods, dyes, or preservatives -pregnant or trying to get pregnant -breast-feeding How should I use this medicine? This drug is given as an infusion into a vein. It is administered in a hospital or clinic by a specially trained health care professional. Talk to your pediatrician regarding the use of this medicine in children. Special care may be needed. Overdosage: If you think you have taken too much of this medicine contact a poison control center or emergency room at once. NOTE: This medicine is only for you. Do not share this medicine with others. What if I miss a dose? It is important not to miss your dose. Call your doctor or health care professional if you are unable to keep an appointment. What may interact with this medicine? -cyclosporine -erythromycin -ketoconazole -medicines to increase blood counts like filgrastim, pegfilgrastim, sargramostim -vaccines Talk to your doctor or health care professional before taking any of these medicines: -acetaminophen -aspirin -ibuprofen -ketoprofen -naproxen This list may not describe all possible interactions. Give your health care provider a list of all the medicines, herbs, non-prescription drugs, or dietary supplements you use. Also tell them if you smoke, drink alcohol, or use illegal drugs. Some items may interact with your medicine. What should I watch for while using this medicine? Your condition will be monitored carefully while you are receiving this medicine. You will need important blood work done while you are taking this medicine. This drug may make you feel generally unwell. This is not uncommon, as chemotherapy can affect healthy cells as well as cancer cells. Report any side effects. Continue your course of treatment even though you feel ill  unless your doctor tells you to stop. In some cases, you may be given additional medicines to help with side effects. Follow all directions for their use. Call your doctor or  health care professional for advice if you get a fever, chills or sore throat, or other symptoms of a cold or flu. Do not treat yourself. This drug decreases your body's ability to fight infections. Try to avoid being around people who are sick. This medicine may increase your risk to bruise or bleed. Call your doctor or health care professional if you notice any unusual bleeding. This medicine may contain alcohol in the product. You may get drowsy or dizzy. Do not drive, use machinery, or do anything that needs mental alertness until you know how this medicine affects you. Do not stand or sit up quickly, especially if you are an older patient. This reduces the risk of dizzy or fainting spells. Avoid alcoholic drinks. Do not become pregnant while taking this medicine. Women should inform their doctor if they wish to become pregnant or think they might be pregnant. There is a potential for serious side effects to an unborn child. Talk to your health care professional or pharmacist for more information. Do not breast-feed an infant while taking this medicine. What side effects may I notice from receiving this medicine? Side effects that you should report to your doctor or health care professional as soon as possible: -allergic reactions like skin rash, itching or hives, swelling of the face, lips, or tongue -low blood counts - This drug may decrease the number of white blood cells, red blood cells and platelets. You may be at increased risk for infections and bleeding. -signs of infection - fever or chills, cough, sore throat, pain or difficulty passing urine -signs of decreased platelets or bleeding - bruising, pinpoint red spots on the skin, black, tarry stools, nosebleeds -signs of decreased red blood cells - unusually weak or tired, fainting spells, lightheadedness -breathing problems -fast or irregular heartbeat -low blood pressure -mouth sores -nausea and vomiting -pain, swelling, redness or  irritation at the injection site -pain, tingling, numbness in the hands or feet -swelling of the ankle, feet, hands -weight gain Side effects that usually do not require medical attention (report to your prescriber or health care professional if they continue or are bothersome): -bone pain -complete hair loss including hair on your head, underarms, pubic hair, eyebrows, and eyelashes -diarrhea -excessive tearing -changes in the color of fingernails -loosening of the fingernails -nausea -muscle pain -red flush to skin -sweating -weak or tired This list may not describe all possible side effects. Call your doctor for medical advice about side effects. You may report side effects to FDA at 1-800-FDA-1088. Where should I keep my medicine? This drug is given in a hospital or clinic and will not be stored at home. NOTE: This sheet is a summary. It may not cover all possible information. If you have questions about this medicine, talk to your doctor, pharmacist, or health care provider.    2016, Elsevier/Gold Standard. (2014-05-16 16:04:57)   Carboplatin injection What is this medicine? CARBOPLATIN (KAR boe pla tin) is a chemotherapy drug. It targets fast dividing cells, like cancer cells, and causes these cells to die. This medicine is used to treat ovarian cancer and many other cancers. This medicine may be used for other purposes; ask your health care provider or pharmacist if you have questions. What should I tell my health care provider before I take this medicine? They need  to know if you have any of these conditions: -blood disorders -hearing problems -kidney disease -recent or ongoing radiation therapy -an unusual or allergic reaction to carboplatin, cisplatin, other chemotherapy, other medicines, foods, dyes, or preservatives -pregnant or trying to get pregnant -breast-feeding How should I use this medicine? This drug is usually given as an infusion into a vein. It is  administered in a hospital or clinic by a specially trained health care professional. Talk to your pediatrician regarding the use of this medicine in children. Special care may be needed. Overdosage: If you think you have taken too much of this medicine contact a poison control center or emergency room at once. NOTE: This medicine is only for you. Do not share this medicine with others. What if I miss a dose? It is important not to miss a dose. Call your doctor or health care professional if you are unable to keep an appointment. What may interact with this medicine? -medicines for seizures -medicines to increase blood counts like filgrastim, pegfilgrastim, sargramostim -some antibiotics like amikacin, gentamicin, neomycin, streptomycin, tobramycin -vaccines Talk to your doctor or health care professional before taking any of these medicines: -acetaminophen -aspirin -ibuprofen -ketoprofen -naproxen This list may not describe all possible interactions. Give your health care provider a list of all the medicines, herbs, non-prescription drugs, or dietary supplements you use. Also tell them if you smoke, drink alcohol, or use illegal drugs. Some items may interact with your medicine. What should I watch for while using this medicine? Your condition will be monitored carefully while you are receiving this medicine. You will need important blood work done while you are taking this medicine. This drug may make you feel generally unwell. This is not uncommon, as chemotherapy can affect healthy cells as well as cancer cells. Report any side effects. Continue your course of treatment even though you feel ill unless your doctor tells you to stop. In some cases, you may be given additional medicines to help with side effects. Follow all directions for their use. Call your doctor or health care professional for advice if you get a fever, chills or sore throat, or other symptoms of a cold or flu. Do not  treat yourself. This drug decreases your body's ability to fight infections. Try to avoid being around people who are sick. This medicine may increase your risk to bruise or bleed. Call your doctor or health care professional if you notice any unusual bleeding. Be careful brushing and flossing your teeth or using a toothpick because you may get an infection or bleed more easily. If you have any dental work done, tell your dentist you are receiving this medicine. Avoid taking products that contain aspirin, acetaminophen, ibuprofen, naproxen, or ketoprofen unless instructed by your doctor. These medicines may hide a fever. Do not become pregnant while taking this medicine. Women should inform their doctor if they wish to become pregnant or think they might be pregnant. There is a potential for serious side effects to an unborn child. Talk to your health care professional or pharmacist for more information. Do not breast-feed an infant while taking this medicine. What side effects may I notice from receiving this medicine? Side effects that you should report to your doctor or health care professional as soon as possible: -allergic reactions like skin rash, itching or hives, swelling of the face, lips, or tongue -signs of infection - fever or chills, cough, sore throat, pain or difficulty passing urine -signs of decreased platelets or bleeding - bruising,  pinpoint red spots on the skin, black, tarry stools, nosebleeds -signs of decreased red blood cells - unusually weak or tired, fainting spells, lightheadedness -breathing problems -changes in hearing -changes in vision -chest pain -high blood pressure -low blood counts - This drug may decrease the number of white blood cells, red blood cells and platelets. You may be at increased risk for infections and bleeding. -nausea and vomiting -pain, swelling, redness or irritation at the injection site -pain, tingling, numbness in the hands or feet -problems  with balance, talking, walking -trouble passing urine or change in the amount of urine Side effects that usually do not require medical attention (report to your doctor or health care professional if they continue or are bothersome): -hair loss -loss of appetite -metallic taste in the mouth or changes in taste This list may not describe all possible side effects. Call your doctor for medical advice about side effects. You may report side effects to FDA at 1-800-FDA-1088. Where should I keep my medicine? This drug is given in a hospital or clinic and will not be stored at home. NOTE: This sheet is a summary. It may not cover all possible information. If you have questions about this medicine, talk to your doctor, pharmacist, or health care provider.    2016, Elsevier/Gold Standard. (2007-08-04 14:38:05)

## 2015-08-09 ENCOUNTER — Ambulatory Visit: Payer: BC Managed Care – PPO

## 2015-08-10 ENCOUNTER — Telehealth: Payer: Self-pay

## 2015-08-10 ENCOUNTER — Other Ambulatory Visit: Payer: Self-pay | Admitting: Nurse Practitioner

## 2015-08-10 ENCOUNTER — Telehealth: Payer: Self-pay | Admitting: Nurse Practitioner

## 2015-08-10 NOTE — Telephone Encounter (Signed)
-----   Message from P[place], RN sent at 08/07/2015  3:12 PM EDT ----- Regarding: Dr. Jana Hakim. FU phone call 1st time chemo Dr. Jana Hakim. 1st itme Taxotere/Cytoxan. toelrated well. FU phone call needed.

## 2015-08-10 NOTE — Telephone Encounter (Signed)
[facility] at [phone] number(s).  Message left requesting a return call for chemotherapy follow up.  Awaiting return call from patient.   Emma [name]'s mobile [phone] for chemotherapy F/U.  Patient is doing well.  Denies n/v.  Denies any new side effects or symptoms.  Bowel and bladder is functioning well.  Trying to eat food but nothing tastes good except smoothies and milk products.  Drinking water well and I instructed to drink 64 oz minimum daily or at least the day before, of and after treatment.  Asks about side effects and how long they last.  Denies further questions at this time and encouraged to call if needed.  Reviewed how to call after hours in the case of an emergency.

## 2015-08-10 NOTE — Telephone Encounter (Signed)
-----   Message from Thompson, RN sent at 08/07/2015  3:12 PM EDT ----- Regarding: Dr. Jana Hakim. FU phone call 1st time chemo Dr. Jana Hakim. 1st itme Taxotere/Cytoxan. toelrated well. FU phone call needed.

## 2015-08-10 NOTE — Telephone Encounter (Signed)
-----   Message from [place], RN sent at 08/07/2015  3:12 PM EDT ----- Regarding: Dr. Jana Hakim. FU phone call 1st time chemo Dr. Jana Hakim. 1st itme Taxotere/Cytoxan. toelrated well. FU phone call needed.

## 2015-08-10 NOTE — Telephone Encounter (Signed)
per pof to sch pt appt-per pof pt aware-r/s appt

## 2015-08-14 ENCOUNTER — Ambulatory Visit: Payer: BC Managed Care – PPO | Admitting: Nurse Practitioner

## 2015-08-14 ENCOUNTER — Other Ambulatory Visit: Payer: BC Managed Care – PPO

## 2015-08-15 ENCOUNTER — Other Ambulatory Visit (HOSPITAL_BASED_OUTPATIENT_CLINIC_OR_DEPARTMENT_OTHER): Payer: BC Managed Care – PPO

## 2015-08-15 ENCOUNTER — Ambulatory Visit (HOSPITAL_BASED_OUTPATIENT_CLINIC_OR_DEPARTMENT_OTHER): Payer: BC Managed Care – PPO | Admitting: Nurse Practitioner

## 2015-08-15 ENCOUNTER — Encounter: Payer: Self-pay | Admitting: Nurse Practitioner

## 2015-08-15 VITALS — BP 134/79 | HR 104 | Temp 98.1°F | Resp 18 | Wt 203.1 lb

## 2015-08-15 DIAGNOSIS — C50412 Malignant neoplasm of upper-outer quadrant of left female breast: Secondary | ICD-10-CM

## 2015-08-15 DIAGNOSIS — D509 Iron deficiency anemia, unspecified: Secondary | ICD-10-CM | POA: Diagnosis not present

## 2015-08-15 DIAGNOSIS — C773 Secondary and unspecified malignant neoplasm of axilla and upper limb lymph nodes: Secondary | ICD-10-CM | POA: Diagnosis not present

## 2015-08-15 LAB — CBC WITH DIFFERENTIAL/PLATELET
BASO%: 0.5 % (ref 0.0–2.0)
BASOS ABS: 0.1 10*3/uL (ref 0.0–0.1)
EOS ABS: 0 10*3/uL (ref 0.0–0.5)
EOS%: 0 % (ref 0.0–7.0)
HCT: 35.1 % (ref 34.8–46.6)
HEMOGLOBIN: 10.7 g/dL — AB (ref 11.6–15.9)
LYMPH%: 20.3 % (ref 14.0–49.7)
MCH: 22.9 pg — AB (ref 25.1–34.0)
MCHC: 30.5 g/dL — ABNORMAL LOW (ref 31.5–36.0)
MCV: 75.1 fL — AB (ref 79.5–101.0)
MONO#: 2 10*3/uL — ABNORMAL HIGH (ref 0.1–0.9)
MONO%: 7 % (ref 0.0–14.0)
NEUT%: 72.2 % (ref 38.4–76.8)
NEUTROS ABS: 20.2 10*3/uL — AB (ref 1.5–6.5)
Platelets: 199 10*3/uL (ref 145–400)
RBC: 4.68 10*6/uL (ref 3.70–5.45)
RDW: 18.9 % — AB (ref 11.2–14.5)
WBC: 28 10*3/uL — AB (ref 3.9–10.3)
lymph#: 5.7 10*3/uL — ABNORMAL HIGH (ref 0.9–3.3)

## 2015-08-15 LAB — COMPREHENSIVE METABOLIC PANEL
ALBUMIN: 3.7 g/dL (ref 3.5–5.0)
ALT: 10 U/L (ref 0–55)
AST: 32 U/L (ref 5–34)
Alkaline Phosphatase: 74 U/L (ref 40–150)
Anion Gap: 8 mEq/L (ref 3–11)
BUN: 11.3 mg/dL (ref 7.0–26.0)
CO2: 26 meq/L (ref 22–29)
Calcium: 9.4 mg/dL (ref 8.4–10.4)
Chloride: 108 mEq/L (ref 98–109)
Creatinine: 1.2 mg/dL — ABNORMAL HIGH (ref 0.6–1.1)
EGFR: 62 mL/min/{1.73_m2} — AB (ref >90)
Glucose: 94 mg/dl (ref 70–140)
POTASSIUM: 3.7 meq/L (ref 3.5–5.1)
SODIUM: 141 meq/L (ref 136–145)
Total Bilirubin: 0.49 mg/dL (ref 0.20–1.20)
Total Protein: 7.3 g/dL (ref 6.4–8.3)

## 2015-08-15 NOTE — Progress Notes (Signed)
[facility]  [phone] [phone]     ID: Emma [name] DOB: 23-Jun-1965  MR#: [id number]  ZHY#:[id number]  Patient Care Team: [name], MD as PCP - General (Family Medicine) [name], MD as Consulting Physician (General Surgery) Lowella Dell, MD as Consulting Physician (Oncology) Antony Blackbird, MD as Consulting Physician (Radiation Oncology) Salomon Fick, NP as Nurse Practitioner (Hematology and Oncology) Levi Aland, MD as Consulting Physician (Obstetrics and Gynecology) PCP: [name], MD OTHER MD:  CHIEF COMPLAINT: Estrogen receptor positive breast cancer  CURRENT TREATMENT: Neoadjuvant chemotherapy  BREAST CANCER HISTORY: Emma [name] had routine screening mammography at Dr. Ewell Poe office/[facility] 06/23/2015. There was an area of distortion in the left breast. The patient was then referred to the Breast Center where on 07/03/2015 he underwent left diagnostic mammography with tomosynthesis and left breast ultrasonography. The breast density was category B. In the far outer left breast there was an area of distortion measuring approximately 4 cm. There appeared to be a prominent left axillary lymph node. On physical exam at the 2:00 position of the left breast 12 cm from the nipple there was an area of approximately 4 cm of firmness. There was no palpable mass in the left axilla. Ultrasonography confirmed an irregular hypoechoic mass in the upper-outer quadrant of the left breast measuring 2.6 cm. A little closer to the axilla there was a second irregular hypoechoic mass measuring 0.5 cm. The distance between these 2 masses was 3.6 cm. In addition there was a right axillary lymph node with a thickened cortex. It measured 0.9 cm.  On 5 or 20 11/30/2015 the patient underwent biopsy of the 2 areas in the breast as well as the axillary lymph node. The larger of the 2 masses was an invasive lobular carcinoma,  E-cadherin negative, estrogen receptor 100% positive, progesterone receptor 100% positive, both with strong staining intensity, with an MIB-1 of 20%. This second, smaller mass, was also an invasive lobular carcinoma, E-cadherin negative, estrogen receptor 90% positive, progesterone receptor 100% positive, both with strong staining intensity, with an MIB-1 of 5%. The right axillary lymph node was also an invasive mammary carcinoma. And also estrogen receptor positive at 70%, progesterone receptor positive at 90%, with an MIB-1 of 10%.  Her subsequent history is as detailed below  INTERVAL HISTORY: Emma [name] returns for follow up of her breast cancer, alone. Today is day 9, cycle 1 of  docetaxel and cyclophosphamide, with neulasta onpro for granulocyte support.   REVIEW OF SYSTEMS: Emma [name] had a decent week. She denies fevers or chills. She had some mild "queeziness" but no major nausea. Her bowel habits are unchanged. Her appetite is good. She denies mouth sores, rashes, or neuropathy symptoms. She did develop bone pain by day 4/5, but only took claritin once on the day of the injection. She had discomfort to her hips and upper legs and took ibuprofen a few times for relief. This has not resolved. She is mildly fatigued, but continues to work daily. A detailed review of systems is otherwise stable.  PAST MEDICAL HISTORY: Past Medical History  Diagnosis Date  . Hypertension   . Breast cancer of upper-outer quadrant of left female breast (HCC) 07/13/2015    PAST SURGICAL HISTORY: Past Surgical History  Procedure Laterality Date  . Cesarean section  1998  . Foot surgery  1993    bunionectomy  . Dilation and evacuation  05/01/2011    Procedure: DILATATION AND EVACUATION;  Surgeon: Levi Aland;  Location: [facility];  Service: Gynecology;  Laterality: N/A;  . Portacath placement Right 07/31/2015    Procedure: INSERTION PORT-A-CATH;  Surgeon: [name], MD;  Location: [facility];   Service: General;  Laterality: Right;    FAMILY HISTORY Family History  Problem Relation Age of Onset  . Colon cancer Mother   The patient's father died at age 70 from "complicated causes". The patient's mother died from colon cancer at the age of 65. He cancer was diagnosed the year prior. The patient has 3 brothers, 11 sisters. There is no history of breast or ovarian cancer and no other colon cancers in the family  GYNECOLOGIC HISTORY:  Patient's last menstrual period was 07/06/2015. Menarche age 85, first live birth age 29, which the patient understands increases the risk of breast cancer. She is GX P2. She still having regular periods as of March 2017. She used birth control remotely with no complications.  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 Her husband [name] works for a Midwife is Chiropodist of environmental services. [name] has a child from a prior marriage, Tiffanye Bourbeau, who is in the [employer]. Ericia as a child from a prior marriage, [employer], who is going to school in [place]. The couple have added son, [name], 43 years old, at home. The patient has one grandchild. She goes to a local [employer].    ADVANCED DIRECTIVES: no   HEALTH MAINTENANCE: Social History  Substance Use Topics  . Smoking status: Never Smoker   . Smokeless tobacco: Not on file  . Alcohol Use: Yes     Comment: social     Colonoscopy: January 2016/[facility]  PAP: February 2017  Bone density: Never  Lipid panel:  No Known Allergies  Current Outpatient Prescriptions  Medication Sig Dispense Refill  . amLODipine (NORVASC) 5 MG tablet Take 5 mg by mouth every morning.      Marland Kitchen dexamethasone (DECADRON) 4 MG tablet Take 2 tablets (8 mg total) by mouth 2 (two) times daily. Start the day before Taxotere. Then again the day after chemo for 3 days. 30 tablet 1  . lidocaine-prilocaine (EMLA)  cream Apply to affected area once 30 g 3  . ondansetron (ZOFRAN) 8 MG tablet Take 1 tablet (8 mg total) by mouth 2 (two) times daily as needed for refractory nausea / vomiting. Start on day 3 after chemo. 30 tablet 1  . HYDROcodone-acetaminophen (NORCO/VICODIN) 5-325 MG tablet Take 1-2 tablets by mouth every 6 (six) hours as needed. (Patient not taking: Reported on 08/03/2015) 30 tablet 0  . LORazepam (ATIVAN) 0.5 MG tablet Take 1 tablet (0.5 mg total) by mouth at bedtime as needed (Nausea or vomiting). (Patient not taking: Reported on 08/15/2015) 30 tablet 0  . prochlorperazine (COMPAZINE) 10 MG tablet Take 1 tablet (10 mg total) by mouth every 6 (six) hours as needed (Nausea or vomiting). (Patient not taking: Reported on 08/15/2015) 30 tablet 1   No current facility-administered medications for this visit.    OBJECTIVE: Middle-aged African-American woman in no acute distress Filed Vitals:   08/15/15 1440  BP: 134/79  Pulse: 104  Temp: 98.1 F (36.7 C)  Resp: 18     Body mass index is 32.8 kg/(m^2).    ECOG FS:0 - Asymptomatic  Sclerae unicteric, pupils round and equal Oropharynx clear and moist-- no thrush or other lesions No cervical or supraclavicular adenopathy Lungs no rales or  rhonchi Heart regular rate and rhythm Abd soft, nontender, positive bowel sounds MSK no focal spinal tenderness, no upper extremity lymphedema Neuro: nonfocal, well oriented, appropriate affect Breasts: deferred  LAB RESULTS:  CMP     Component Value Date/Time   NA 141 08/15/2015 1419   K 3.7 08/15/2015 1419   CO2 26 08/15/2015 1419   GLUCOSE 94 08/15/2015 1419   BUN 11.3 08/15/2015 1419   CREATININE 1.2* 08/15/2015 1419   CALCIUM 9.4 08/15/2015 1419   PROT 7.3 08/15/2015 1419   ALBUMIN 3.7 08/15/2015 1419   AST 32 08/15/2015 1419   ALT 10 08/15/2015 1419   ALKPHOS 74 08/15/2015 1419   BILITOT 0.49 08/15/2015 1419    INo results found for: SPEP, UPEP  Lab Results  Component Value Date    WBC 28.0* 08/15/2015   NEUTROABS 20.2* 08/15/2015   HGB 10.7* 08/15/2015   HCT 35.1 08/15/2015   MCV 75.1* 08/15/2015   PLT 199 08/15/2015      Chemistry      Component Value Date/Time   NA 141 08/15/2015 1419   K 3.7 08/15/2015 1419   CO2 26 08/15/2015 1419   BUN 11.3 08/15/2015 1419   CREATININE 1.2* 08/15/2015 1419      Component Value Date/Time   CALCIUM 9.4 08/15/2015 1419   ALKPHOS 74 08/15/2015 1419   AST 32 08/15/2015 1419   ALT 10 08/15/2015 1419   BILITOT 0.49 08/15/2015 1419       No results found for: LABCA2  No components found for: LABCA125  No results for input(s): INR in the last 168 hours.  Urinalysis No results found for: COLORURINE, APPEARANCEUR, LABSPEC, PHURINE, GLUCOSEU, HGBUR, BILIRUBINUR, KETONESUR, PROTEINUR, UROBILINOGEN, NITRITE, LEUKOCYTESUR    ELIGIBLE FOR AVAILABLE RESEARCH PROTOCOL: [facility]  STUDIES: Ct Chest W Contrast  07/27/2015  CLINICAL DATA:  New breast cancer diagnosis. Upper outer quadrant left breast. Positive lymph node. Diagnosed 2/17. EXAM: CT CHEST, ABDOMEN, AND PELVIS WITH CONTRAST TECHNIQUE: Multidetector CT imaging of the chest, abdomen and pelvis was performed following the standard protocol during bolus administration of intravenous contrast. CONTRAST:  OMNIPAQUE IOHEXOL 300 MG/ML  SOLN COMPARISON:  Obstetric ultrasound of 04/30/2011. Breast MR 07/21/2015. FINDINGS: CT CHEST FINDINGS Mediastinum/Lymph Nodes: No supraclavicular adenopathy. Normal heart size, without pericardial effusion. No central pulmonary embolism, on this non-dedicated study. No mediastinal or hilar adenopathy. No internal mammary adenopathy. Lungs/Pleura: No pleural fluid.  Clear lungs. Musculoskeletal: Left breast primary, including on image 24/series 2 laterally. Suspicious left axillary node measures 1.1 x 1.6 cm on image 15/series 2. No subpectoral adenopathy. No acute osseous abnormality. CT ABDOMEN PELVIS FINDINGS Hepatobiliary: Vague focal  steatosis adjacent the falciform ligament. Normal gallbladder, without biliary ductal dilatation. Pancreas: Normal, without mass or ductal dilatation. Spleen: Normal in size, without focal abnormality. Adrenals/Urinary Tract: Normal adrenal glands. Too small to characterize lower pole right renal lesion. Normal left kidney, without hydronephrosis. Normal urinary bladder. Stomach/Bowel: Normal stomach, without wall thickening. Normal colon and terminal ileum. Normal small bowel. Vascular/Lymphatic: Normal caliber of the aorta and branch vessels. No abdominopelvic adenopathy. Reproductive: Lobular, heterogeneous uterus, likely related to multiple fibroids. Suboptimally evaluated. Suspect a right sided fundal lesion with possible submucosal extension. Example 5.6 x 3.6 cm on image 103/series 2. No adnexal mass. Other: No significant free fluid. No evidence of omental or peritoneal disease. Musculoskeletal: No acute osseous abnormality. IMPRESSION: 1. Left breast primary with suspicious left axillary node, as on prior MRI. 2. No evidence of distant metastatic disease. Electronically Signed  By: Jeronimo Greaves M.D.   On: 07/27/2015 13:24   Nm Bone Scan Whole Body  07/27/2015  CLINICAL DATA:  Malignant neoplasm of upper outer quadrant of left female breast. EXAM: NUCLEAR MEDICINE WHOLE BODY BONE SCAN TECHNIQUE: Whole body anterior and posterior images were obtained approximately 3 hours after intravenous injection of radiopharmaceutical. RADIOPHARMACEUTICALS:  26.7 mCi Technetium-70m MDP IV COMPARISON:  None. FINDINGS: Abnormal uptake is seen involving the right knee consistent with degenerative joint disease. Focus of abnormal uptake is seen to the right of the lower cervical spine which may represent degenerative change, but metastatic disease cannot be excluded. No other areas of abnormal uptake are noted. IMPRESSION: Mild uptake is noted in right knee consistent with degenerative joint disease. Focus of abnormal  uptake is seen to the right of the lower cervical spine which may represent degenerative change, but plain film radiographs of the cervical spine are recommended for confirmation and to rule out the possibility of metastatic focus. Electronically Signed   By: Lupita Raider, M.D.   On: 07/27/2015 12:45   Ct Abdomen Pelvis W Contrast  07/27/2015  CLINICAL DATA:  New breast cancer diagnosis. Upper outer quadrant left breast. Positive lymph node. Diagnosed 2/17. EXAM: CT CHEST, ABDOMEN, AND PELVIS WITH CONTRAST TECHNIQUE: Multidetector CT imaging of the chest, abdomen and pelvis was performed following the standard protocol during bolus administration of intravenous contrast. CONTRAST:  OMNIPAQUE IOHEXOL 300 MG/ML  SOLN COMPARISON:  Obstetric ultrasound of 04/30/2011. Breast MR 07/21/2015. FINDINGS: CT CHEST FINDINGS Mediastinum/Lymph Nodes: No supraclavicular adenopathy. Normal heart size, without pericardial effusion. No central pulmonary embolism, on this non-dedicated study. No mediastinal or hilar adenopathy. No internal mammary adenopathy. Lungs/Pleura: No pleural fluid.  Clear lungs. Musculoskeletal: Left breast primary, including on image 24/series 2 laterally. Suspicious left axillary node measures 1.1 x 1.6 cm on image 15/series 2. No subpectoral adenopathy. No acute osseous abnormality. CT ABDOMEN PELVIS FINDINGS Hepatobiliary: Vague focal steatosis adjacent the falciform ligament. Normal gallbladder, without biliary ductal dilatation. Pancreas: Normal, without mass or ductal dilatation. Spleen: Normal in size, without focal abnormality. Adrenals/Urinary Tract: Normal adrenal glands. Too small to characterize lower pole right renal lesion. Normal left kidney, without hydronephrosis. Normal urinary bladder. Stomach/Bowel: Normal stomach, without wall thickening. Normal colon and terminal ileum. Normal small bowel. Vascular/Lymphatic: Normal caliber of the aorta and branch vessels. No abdominopelvic  adenopathy. Reproductive: Lobular, heterogeneous uterus, likely related to multiple fibroids. Suboptimally evaluated. Suspect a right sided fundal lesion with possible submucosal extension. Example 5.6 x 3.6 cm on image 103/series 2. No adnexal mass. Other: No significant free fluid. No evidence of omental or peritoneal disease. Musculoskeletal: No acute osseous abnormality. IMPRESSION: 1. Left breast primary with suspicious left axillary node, as on prior MRI. 2. No evidence of distant metastatic disease. Electronically Signed   By: Jeronimo Greaves M.D.   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 Biopsy of a left axillary lymph node demonstrated metastatic disease. Pre treatment evaluation to determine extent of disease. LABS:  Not applicable. EXAM: BILATERAL BREAST MRI WITH AND WITHOUT CONTRAST TECHNIQUE: Multiplanar, multisequence MR images of both breasts were obtained prior to and following the intravenous administration of 20 ml of MultiHance. THREE-DIMENSIONAL MR IMAGE RENDERING ON INDEPENDENT WORKSTATION: Three-dimensional MR images were rendered by post-processing  of the original MR data on an independent workstation. The three-dimensional MR images were interpreted, and findings are reported in the following complete MRI report for this study. Three dimensional images were evaluated at the independent DynaCad workstation. COMPARISON:  No prior MRI. Mammography 07/10/2015 and 07/03/2015 (left), 06/19/2015 (bilateral) and earlier. Left breast and left axilla ultrasound 07/10/2015, 07/03/2015. FINDINGS: Breast composition: b. Scattered fibroglandular tissue. Background parenchymal enhancement: Moderate. Right breast: No suspicious mass or abnormal enhancement. Left breast: Masslike and non  masslike enhancement involving the upper inner quadrant, middle to posterior depth, spanning approximately 6.7 x 3.9 x 4.3 cm. The susceptibility artifact from the biopsy marker clips are adjacent to each other in the anterior portion of this enhancement, separated by approximately 3-4 cm on the recent post clip mammogram. No abnormal enhancement elsewhere in the left breast. Lymph nodes: Approximate 2 cm pathologic lymph node in the left axilla with susceptibility artifact from the clip placed at the time of recent biopsy. Indeterminate 1.2 cm right axillary lymph node demonstrating diffuse cortical thickening. No evidence of internal mammary lymphadenopathy. Ancillary findings:  None. IMPRESSION: 1. Masslike and non mass like enhancement involving the upper inner quadrant of the left breast, middle to posterior depth, corresponding to the biopsy-proven invasive lobular carcinoma and LCIS. Measurements are given above. The biopsy clips are at the anterior margin of this enhancement. 2. 2.0 cm pathologic left axillary lymph node corresponding to the biopsy-proven metastatic disease. 3. Indeterminate 1.2 cm right axillary lymph node with cortical thickening. In the absence of malignant enhancement in the right breast, this is statistically likely to represent a reactive node. RECOMMENDATION: Treatment plan. If it is necessary to confirm that the posterior margin of the abnormal enhancement represents lobular neoplasia prior to treatment, MRI guided biopsy could be performed. BI-RADS CATEGORY  6: Known biopsy-proven malignancy. Electronically Signed   By: Hulan Saas M.D.   On: 07/21/2015 14:50   Dg Chest Port 1 View  07/31/2015  CLINICAL DATA:  50 year old female status post right chest porta cath placement. Left breast cancer. Subsequent encounter. EXAM: PORTABLE CHEST 1 VIEW COMPARISON:  Chest CT 07/27/2015 FINDINGS: Seated upright AP portable view at 0952 hours. Right chest porta cath now in place,  subclavian approach catheter tip projects at the lower SVC level. No pneumothorax. Allowing for portable technique, the lungs are clear. Mediastinal contours are stable and within normal limits. Visualized tracheal air column is within normal limits. No osseous abnormality identified. IMPRESSION: Right chest subclavian approach porta cath placed with no adverse features. Electronically Signed   By: Odessa Fleming M.D.   On: 07/31/2015 09:27   Dg Fluoro Guide Cv Line-no Report  07/31/2015  CLINICAL DATA:  FLOURO GUIDE CV LINE Fluoroscopy was utilized by the requesting physician.  No radiographic interpretation.    ASSESSMENT: 50 y.o. [facility] woman status post left breast biopsy 07/10/2015 of 2 separate masses as well as left axillary lymph node, all positive for invasive lobular carcinoma, grade 1, estrogen and progesterone receptor positive, HER-2 not amplified, with an MIB-1 between 5 and 20%  (1) neoadjuvan chemotherapy to consist of cyclophosphamide and docetaxel every 3 weeks 4  (2) definitive surgery to follow, with consideration of the Alliance trial  (3) adjuvant radiation to follow surgery  (4) anti-estrogens to start at the completion of local therapy  PLAN: Timira managed her symptoms well with her first cycle of treatment. I do not believe any changes to her regimen will be necessary. She will be sure  to use claritin for the full 3 days after her neulasta injection, as this will help minimize her bone pain. Otherwise ibuprofen PRN is completely acceptable.   The labs were reviewed in detail. She is displaying microcytic anemia, that was apparently present prior to the start of chemotherapy. I am adding a ferritin level to her labs work when she returns to investigate an iron deficiency.   Ugochi will return in 2 weeks for cycle 2 of treatment. She understands and agrees with this plan. She knows the goal of treatment in her case is control. She has been encouraged to call  with any issues that might arise before her next visit here.  Emma Slider, NP   08/15/2015 3:10 PM

## 2015-08-16 ENCOUNTER — Encounter: Payer: Self-pay | Admitting: Oncology

## 2015-08-16 NOTE — Progress Notes (Signed)
Enrolled pt w/ the [employer] [place] which she is approved for $5000 to cover drugs associated w/ her Dx for 12 months from 08/16/15 with a 6 month look back period. [id number] is guaranteed, $3350 is accessible on a first-come first-served basis as long as funding is available. I emailed copy of approval letter & POE to [facility] in billing.  I also enrolled pt in the Neulasta First Step program.  Informed pt of the [place] went over what it will & will not cover.  Gave her an expense sheet. Pt will supply proof of income to apply for the [employer].

## 2015-08-28 ENCOUNTER — Other Ambulatory Visit (HOSPITAL_BASED_OUTPATIENT_CLINIC_OR_DEPARTMENT_OTHER): Payer: BC Managed Care – PPO

## 2015-08-28 ENCOUNTER — Ambulatory Visit (HOSPITAL_BASED_OUTPATIENT_CLINIC_OR_DEPARTMENT_OTHER): Payer: BC Managed Care – PPO

## 2015-08-28 ENCOUNTER — Other Ambulatory Visit: Payer: Self-pay | Admitting: *Deleted

## 2015-08-28 ENCOUNTER — Encounter: Payer: Self-pay | Admitting: Nurse Practitioner

## 2015-08-28 ENCOUNTER — Other Ambulatory Visit: Payer: Self-pay | Admitting: Oncology

## 2015-08-28 ENCOUNTER — Telehealth: Payer: Self-pay | Admitting: Nurse Practitioner

## 2015-08-28 ENCOUNTER — Encounter: Payer: Self-pay | Admitting: *Deleted

## 2015-08-28 ENCOUNTER — Ambulatory Visit (HOSPITAL_BASED_OUTPATIENT_CLINIC_OR_DEPARTMENT_OTHER): Payer: BC Managed Care – PPO | Admitting: Nurse Practitioner

## 2015-08-28 VITALS — BP 136/81 | HR 110 | Temp 98.5°F | Resp 18 | Ht 66.0 in | Wt 207.9 lb

## 2015-08-28 DIAGNOSIS — D509 Iron deficiency anemia, unspecified: Secondary | ICD-10-CM | POA: Diagnosis not present

## 2015-08-28 DIAGNOSIS — Z5189 Encounter for other specified aftercare: Secondary | ICD-10-CM | POA: Diagnosis not present

## 2015-08-28 DIAGNOSIS — C50412 Malignant neoplasm of upper-outer quadrant of left female breast: Secondary | ICD-10-CM

## 2015-08-28 DIAGNOSIS — C773 Secondary and unspecified malignant neoplasm of axilla and upper limb lymph nodes: Secondary | ICD-10-CM | POA: Diagnosis not present

## 2015-08-28 DIAGNOSIS — Z5111 Encounter for antineoplastic chemotherapy: Secondary | ICD-10-CM | POA: Diagnosis not present

## 2015-08-28 LAB — COMPREHENSIVE METABOLIC PANEL
ALBUMIN: 4 g/dL (ref 3.5–5.0)
ALK PHOS: 52 U/L (ref 40–150)
ALT: <9 U/L (ref 0–55)
ANION GAP: 12 meq/L — AB (ref 3–11)
AST: 15 U/L (ref 5–34)
BILIRUBIN TOTAL: 0.58 mg/dL (ref 0.20–1.20)
BUN: 16.2 mg/dL (ref 7.0–26.0)
CALCIUM: 9.6 mg/dL (ref 8.4–10.4)
CO2: 18 mEq/L — ABNORMAL LOW (ref 22–29)
Chloride: 109 mEq/L (ref 98–109)
Creatinine: 1.1 mg/dL (ref 0.6–1.1)
EGFR: 69 mL/min/{1.73_m2} — AB (ref >90)
Glucose: 146 mg/dl — ABNORMAL HIGH (ref 70–140)
Potassium: 3.8 mEq/L (ref 3.5–5.1)
Sodium: 139 mEq/L (ref 136–145)
TOTAL PROTEIN: 7.9 g/dL (ref 6.4–8.3)

## 2015-08-28 LAB — CBC WITH DIFFERENTIAL/PLATELET
BASO%: 1 % (ref 0.0–2.0)
Basophils Absolute: 0.1 10*3/uL (ref 0.0–0.1)
EOS ABS: 0 10*3/uL (ref 0.0–0.5)
EOS%: 0 % (ref 0.0–7.0)
HEMATOCRIT: 37.4 % (ref 34.8–46.6)
HEMOGLOBIN: 11.6 g/dL (ref 11.6–15.9)
LYMPH%: 12.5 % — AB (ref 14.0–49.7)
MCH: 23.5 pg — ABNORMAL LOW (ref 25.1–34.0)
MCHC: 30.9 g/dL — ABNORMAL LOW (ref 31.5–36.0)
MCV: 75.9 fL — AB (ref 79.5–101.0)
MONO#: 0.1 10*3/uL (ref 0.1–0.9)
MONO%: 0.8 % (ref 0.0–14.0)
NEUT%: 85.7 % — ABNORMAL HIGH (ref 38.4–76.8)
NEUTROS ABS: 12.4 10*3/uL — AB (ref 1.5–6.5)
PLATELETS: 385 10*3/uL (ref 145–400)
RBC: 4.93 10*6/uL (ref 3.70–5.45)
RDW: 18.7 % — ABNORMAL HIGH (ref 11.2–14.5)
WBC: 14.4 10*3/uL — AB (ref 3.9–10.3)
lymph#: 1.8 10*3/uL (ref 0.9–3.3)

## 2015-08-28 LAB — FERRITIN: Ferritin: 40 ng/ml (ref 9–269)

## 2015-08-28 MED ORDER — SODIUM CHLORIDE 0.9 % IV SOLN
10.0000 mg | Freq: Once | INTRAVENOUS | Status: AC
  Administered 2015-08-28: 10 mg via INTRAVENOUS
  Filled 2015-08-28: qty 1

## 2015-08-28 MED ORDER — SODIUM CHLORIDE 0.9 % IV SOLN
600.0000 mg/m2 | Freq: Once | INTRAVENOUS | Status: AC
  Administered 2015-08-28: 1260 mg via INTRAVENOUS
  Filled 2015-08-28: qty 63

## 2015-08-28 MED ORDER — SODIUM CHLORIDE 0.9 % IV SOLN
75.0000 mg/m2 | Freq: Once | INTRAVENOUS | Status: AC
  Administered 2015-08-28: 160 mg via INTRAVENOUS
  Filled 2015-08-28: qty 16

## 2015-08-28 MED ORDER — SODIUM CHLORIDE 0.9% FLUSH
10.0000 mL | INTRAVENOUS | Status: DC | PRN
  Administered 2015-08-28: 10 mL
  Filled 2015-08-28: qty 10

## 2015-08-28 MED ORDER — PALONOSETRON HCL INJECTION 0.25 MG/5ML
INTRAVENOUS | Status: AC
  Filled 2015-08-28: qty 5

## 2015-08-28 MED ORDER — SODIUM CHLORIDE 0.9 % IV SOLN
Freq: Once | INTRAVENOUS | Status: AC
  Administered 2015-08-28: 10:00:00 via INTRAVENOUS

## 2015-08-28 MED ORDER — PEGFILGRASTIM 6 MG/0.6ML ~~LOC~~ PSKT
6.0000 mg | PREFILLED_SYRINGE | Freq: Once | SUBCUTANEOUS | Status: AC
  Administered 2015-08-28: 6 mg via SUBCUTANEOUS
  Filled 2015-08-28: qty 0.6

## 2015-08-28 MED ORDER — ONDANSETRON HCL 8 MG PO TABS: 8.0000 mg | ORAL_TABLET | Freq: Two times a day (BID) | ORAL | Status: DC | PRN

## 2015-08-28 MED ORDER — HEPARIN SOD (PORK) LOCK FLUSH 100 UNIT/ML IV SOLN
500.0000 [IU] | Freq: Once | INTRAVENOUS | Status: AC | PRN
  Administered 2015-08-28: 500 [IU]
  Filled 2015-08-28: qty 5

## 2015-08-28 MED ORDER — PALONOSETRON HCL INJECTION 0.25 MG/5ML
0.2500 mg | Freq: Once | INTRAVENOUS | Status: AC
  Administered 2015-08-28: 0.25 mg via INTRAVENOUS

## 2015-08-28 NOTE — Telephone Encounter (Signed)
appt made and avs will print in treatment room °

## 2015-08-28 NOTE — Patient Instructions (Signed)
[facility] Discharge Instructions for Patients Receiving Chemotherapy  Today you received the following chemotherapy agents Taxotere/Cytoxan.  To help prevent nausea and vomiting after your treatment, we encourage you to take your nausea medication as directed.   If you develop nausea and vomiting that is not controlled by your nausea medication, call the clinic.   BELOW ARE SYMPTOMS THAT SHOULD BE REPORTED IMMEDIATELY:  *FEVER GREATER THAN 100.5 F  *CHILLS WITH OR WITHOUT FEVER  NAUSEA AND VOMITING THAT IS NOT CONTROLLED WITH YOUR NAUSEA MEDICATION  *UNUSUAL SHORTNESS OF BREATH  *UNUSUAL BRUISING OR BLEEDING  TENDERNESS IN MOUTH AND THROAT WITH OR WITHOUT PRESENCE OF ULCERS  *URINARY PROBLEMS  *BOWEL PROBLEMS  UNUSUAL RASH Items with * indicate a potential emergency and should be followed up as soon as possible.  Feel free to call the clinic you have any questions or concerns. The clinic phone number is [phone].  Please show the [place] at check-in to the Emergency Department and triage nurse.  Pegfilgrastim injection What is this medicine? PEGFILGRASTIM (PEG fil gra stim) is a long-acting granulocyte colony-stimulating factor that stimulates the growth of neutrophils, a type of white blood cell important in the body's fight against infection. It is used to reduce the incidence of fever and infection in patients with certain types of cancer who are receiving chemotherapy that affects the bone marrow, and to increase survival after being exposed to high doses of radiation. This medicine may be used for other purposes; ask your health care provider or pharmacist if you have questions. What should I tell my health care provider before I take this medicine? They need to know if you have any of these conditions: -kidney disease -latex allergy -ongoing radiation therapy -sickle cell disease -skin reactions to acrylic adhesives (On-Body Injector  only) -an unusual or allergic reaction to pegfilgrastim, filgrastim, other medicines, foods, dyes, or preservatives -pregnant or trying to get pregnant -breast-feeding How should I use this medicine? This medicine is for injection under the skin. If you get this medicine at home, you will be taught how to prepare and give the pre-filled syringe or how to use the On-body Injector. Refer to the patient Instructions for Use for detailed instructions. Use exactly as directed. Take your medicine at regular intervals. Do not take your medicine more often than directed. It is important that you put your used needles and syringes in a special sharps container. Do not put them in a trash can. If you do not have a sharps container, call your pharmacist or healthcare provider to get one. Talk to your pediatrician regarding the use of this medicine in children. While this drug may be prescribed for selected conditions, precautions do apply. Overdosage: If you think you have taken too much of this medicine contact a poison control center or emergency room at once. NOTE: This medicine is only for you. Do not share this medicine with others. What if I miss a dose? It is important not to miss your dose. Call your doctor or health care professional if you miss your dose. If you miss a dose due to an On-body Injector failure or leakage, a new dose should be administered as soon as possible using a single prefilled syringe for manual use. What may interact with this medicine? Interactions have not been studied. Give your health care provider a list of all the medicines, herbs, non-prescription drugs, or dietary supplements you use. Also tell them if you smoke, drink alcohol, or  use illegal drugs. Some items may interact with your medicine. This list may not describe all possible interactions. Give your health care provider a list of all the medicines, herbs, non-prescription drugs, or dietary supplements you use. Also  tell them if you smoke, drink alcohol, or use illegal drugs. Some items may interact with your medicine. What should I watch for while using this medicine? You may need blood work done while you are taking this medicine. If you are going to need a MRI, CT scan, or other procedure, tell your doctor that you are using this medicine (On-Body Injector only). What side effects may I notice from receiving this medicine? Side effects that you should report to your doctor or health care professional as soon as possible: -allergic reactions like skin rash, itching or hives, swelling of the face, lips, or tongue -dizziness -fever -pain, redness, or irritation at site where injected -pinpoint red spots on the skin -red or dark-brown urine -shortness of breath or breathing problems -stomach or side pain, or pain at the shoulder -swelling -tiredness -trouble passing urine or change in the amount of urine Side effects that usually do not require medical attention (report to your doctor or health care professional if they continue or are bothersome): -bone pain -muscle pain This list may not describe all possible side effects. Call your doctor for medical advice about side effects. You may report side effects to FDA at 1-800-FDA-1088. Where should I keep my medicine? Keep out of the reach of children. Store pre-filled syringes in a refrigerator between 2 and 8 degrees C (36 and 46 degrees F). Do not freeze. Keep in carton to protect from light. Throw away this medicine if it is left out of the refrigerator for more than 48 hours. Throw away any unused medicine after the expiration date. NOTE: This sheet is a summary. It may not cover all possible information. If you have questions about this medicine, talk to your doctor, pharmacist, or health care provider.    2016, Elsevier/Gold Standard. (2014-05-19 14:30:14)

## 2015-08-28 NOTE — Progress Notes (Signed)
[facility]  [phone] [phone]     ID: Emma [name] DOB: 01-May-1966  MR#: [id number]  WNU#:[id number]  Patient Care Team: [name], MD as PCP - General (Family Medicine) [name], MD as Consulting Physician (General Surgery) Lowella Dell, MD as Consulting Physician (Oncology) Antony Blackbird, MD as Consulting Physician (Radiation Oncology) Salomon Fick, NP as Nurse Practitioner (Hematology and Oncology) Levi Aland, MD as Consulting Physician (Obstetrics and Gynecology) PCP: [name], MD OTHER MD:  CHIEF COMPLAINT: Estrogen receptor positive breast cancer  CURRENT TREATMENT: Neoadjuvant chemotherapy  BREAST CANCER HISTORY: Emma [name] had routine screening mammography at Dr. Ewell Poe office/[facility] 06/23/2015. There was an area of distortion in the left breast. The patient was then referred to the Breast Center where on 07/03/2015 he underwent left diagnostic mammography with tomosynthesis and left breast ultrasonography. The breast density was category B. In the far outer left breast there was an area of distortion measuring approximately 4 cm. There appeared to be a prominent left axillary lymph node. On physical exam at the 2:00 position of the left breast 12 cm from the nipple there was an area of approximately 4 cm of firmness. There was no palpable mass in the left axilla. Ultrasonography confirmed an irregular hypoechoic mass in the upper-outer quadrant of the left breast measuring 2.6 cm. A little closer to the axilla there was a second irregular hypoechoic mass measuring 0.5 cm. The distance between these 2 masses was 3.6 cm. In addition there was a right axillary lymph node with a thickened cortex. It measured 0.9 cm.  On 5 or 20 11/30/2015 the patient underwent biopsy of the 2 areas in the breast as well as the axillary lymph node. The larger of the 2 masses was an invasive lobular carcinoma,  E-cadherin negative, estrogen receptor 100% positive, progesterone receptor 100% positive, both with strong staining intensity, with an MIB-1 of 20%. This second, smaller mass, was also an invasive lobular carcinoma, E-cadherin negative, estrogen receptor 90% positive, progesterone receptor 100% positive, both with strong staining intensity, with an MIB-1 of 5%. The right axillary lymph node was also an invasive mammary carcinoma. And also estrogen receptor positive at 70%, progesterone receptor positive at 90%, with an MIB-1 of 10%.  Her subsequent history is as detailed below  INTERVAL HISTORY: Emma [name] returns for follow up of her breast cancer, alone. Today is day 1, cycle 2 of  docetaxel and cyclophosphamide, with neulasta onpro for granulocyte support.   REVIEW OF SYSTEMS: Emma [name] denies fevers, chills, nausea, or vomiting. She is moving her bowels well. Her appetite is good. She denies mouth sores, rashes, or neuropathy symptoms. Her bone pain has finally resolved. She is mildly fatigued, but continues to work full time. She is sleeping well. A detailed review of systems is otherwise stable.  PAST MEDICAL HISTORY: Past Medical History  Diagnosis Date  . Hypertension   . Breast cancer of upper-outer quadrant of left female breast (HCC) 07/13/2015    PAST SURGICAL HISTORY: Past Surgical History  Procedure Laterality Date  . Cesarean section  1998  . Foot surgery  1993    bunionectomy  . Dilation and evacuation  05/01/2011    Procedure: DILATATION AND EVACUATION;  Surgeon: Levi Aland;  Location: [facility];  Service: Gynecology;  Laterality: N/A;  . Portacath placement Right 07/31/2015    Procedure: INSERTION PORT-A-CATH;  Surgeon: [name], MD;  Location: [facility];  Service: General;  Laterality: Right;  FAMILY HISTORY Family History  Problem Relation Age of Onset  . Colon cancer Mother   The patient's father died at age 29 from "complicated causes". The  patient's mother died from colon cancer at the age of 2. He cancer was diagnosed the year prior. The patient has 3 brothers, 11 sisters. There is no history of breast or ovarian cancer and no other colon cancers in the family  GYNECOLOGIC HISTORY:  No LMP recorded. Menarche age 48, first live birth age 33, which the patient understands increases the risk of breast cancer. She is GX P2. She still having regular periods as of March 2017. She used birth control remotely with no complications.  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 Her husband [name] works for a Midwife is Chiropodist of environmental services. [name] has a child from a prior marriage, Lafreda Ouimette, who is in the [employer]. Arla as a child from a prior marriage, [employer], who is going to school in [place]. The couple have added son, [name], 35 years old, at home. The patient has one grandchild. She goes to a local [employer].    ADVANCED DIRECTIVES: no   HEALTH MAINTENANCE: Social History  Substance Use Topics  . Smoking status: Never Smoker   . Smokeless tobacco: Not on file  . Alcohol Use: Yes     Comment: social     Colonoscopy: January 2016/[facility]  PAP: February 2017  Bone density: Never  Lipid panel:  No Known Allergies  Current Outpatient Prescriptions  Medication Sig Dispense Refill  . dexamethasone (DECADRON) 4 MG tablet Take 2 tablets (8 mg total) by mouth 2 (two) times daily. Start the day before Taxotere. Then again the day after chemo for 3 days. 30 tablet 1  . lidocaine-prilocaine (EMLA) cream Apply to affected area once 30 g 3  . LORazepam (ATIVAN) 0.5 MG tablet Take 1 tablet (0.5 mg total) by mouth at bedtime as needed (Nausea or vomiting). 30 tablet 0  . amLODipine (NORVASC) 5 MG tablet Take 5 mg by mouth every morning. Reported on 08/28/2015    . HYDROcodone-acetaminophen  (NORCO/VICODIN) 5-325 MG tablet Take 1-2 tablets by mouth every 6 (six) hours as needed. (Patient not taking: Reported on 08/03/2015) 30 tablet 0  . ondansetron (ZOFRAN) 8 MG tablet Take 1 tablet (8 mg total) by mouth 2 (two) times daily as needed for refractory nausea / vomiting. Start on day 3 after chemo. (Patient not taking: Reported on 08/28/2015) 30 tablet 1  . prochlorperazine (COMPAZINE) 10 MG tablet Take 1 tablet (10 mg total) by mouth every 6 (six) hours as needed (Nausea or vomiting). (Patient not taking: Reported on 08/15/2015) 30 tablet 1   No current facility-administered medications for this visit.   Facility-Administered Medications Ordered in Other Visits  Medication Dose Route Frequency Provider Last Rate Last Dose  . 0.9 %  sodium chloride infusion   Intravenous Once Lowella Dell, MD      . cyclophosphamide (CYTOXAN) 1,260 mg in sodium chloride 0.9 % 250 mL chemo infusion  600 mg/m2 (Treatment Plan Actual) Intravenous Once Lowella Dell, MD      . dexamethasone (DECADRON) 10 mg in sodium chloride 0.9 % 50 mL IVPB  10 mg Intravenous Once Lowella Dell, MD      . DOCEtaxel (TAXOTERE) 160 mg in dextrose 5 % 250 mL chemo infusion  75 mg/m2 (Treatment Plan Actual) Intravenous  Once Lowella Dell, MD      . heparin lock flush 100 unit/mL  500 Units Intracatheter Once PRN Lowella Dell, MD      . palonosetron (ALOXI) injection 0.25 mg  0.25 mg Intravenous Once Lowella Dell, MD      . pegfilgrastim (NEULASTA ONPRO KIT) injection 6 mg  6 mg Subcutaneous Once Lowella Dell, MD      . sodium chloride flush (NS) 0.9 % injection 10 mL  10 mL Intracatheter PRN Lowella Dell, MD        OBJECTIVE: Middle-aged African-American woman in no acute distress Filed Vitals:   08/28/15 0923  BP: 136/81  Pulse: 110  Temp: 98.5 F (36.9 C)  Resp: 18     Body mass index is 33.57 kg/(m^2).    ECOG FS:0 - Asymptomatic   Skin: warm, dry  HEENT: sclerae anicteric,  conjunctivae pink, oropharynx clear. No thrush or mucositis.  Lymph Nodes: No cervical or supraclavicular lymphadenopathy  Lungs: clear to auscultation bilaterally, no rales, wheezes, or rhonci  Heart: regular rate and rhythm  Abdomen: round, soft, non tender, positive bowel sounds  Musculoskeletal: No focal spinal tenderness, no peripheral edema  Neuro: non focal, well oriented, positive affect  Breasts:deferred  LAB RESULTS:  CMP     Component Value Date/Time   NA 139 08/28/2015 0910   K 3.8 08/28/2015 0910   CO2 18* 08/28/2015 0910   GLUCOSE 146* 08/28/2015 0910   BUN 16.2 08/28/2015 0910   CREATININE 1.1 08/28/2015 0910   CALCIUM 9.6 08/28/2015 0910   PROT 7.9 08/28/2015 0910   ALBUMIN 4.0 08/28/2015 0910   AST 15 08/28/2015 0910   ALT <9 08/28/2015 0910   ALKPHOS 52 08/28/2015 0910   BILITOT 0.58 08/28/2015 0910    INo results found for: SPEP, UPEP  Lab Results  Component Value Date   WBC 14.4* 08/28/2015   NEUTROABS 12.4* 08/28/2015   HGB 11.6 08/28/2015   HCT 37.4 08/28/2015   MCV 75.9* 08/28/2015   PLT 385 08/28/2015      Chemistry      Component Value Date/Time   NA 139 08/28/2015 0910   K 3.8 08/28/2015 0910   CO2 18* 08/28/2015 0910   BUN 16.2 08/28/2015 0910   CREATININE 1.1 08/28/2015 0910      Component Value Date/Time   CALCIUM 9.6 08/28/2015 0910   ALKPHOS 52 08/28/2015 0910   AST 15 08/28/2015 0910   ALT <9 08/28/2015 0910   BILITOT 0.58 08/28/2015 0910       No results found for: LABCA2  No components found for: [id number]  No results for input(s): INR in the last 168 hours.  Urinalysis No results found for: COLORURINE, APPEARANCEUR, LABSPEC, PHURINE, GLUCOSEU, HGBUR, BILIRUBINUR, KETONESUR, PROTEINUR, UROBILINOGEN, NITRITE, LEUKOCYTESUR    ELIGIBLE FOR AVAILABLE RESEARCH PROTOCOL: [facility]  STUDIES: Dg Chest Port 1 View  07/31/2015  CLINICAL DATA:  50 year old female status post right chest porta cath placement. Left breast  cancer. Subsequent encounter. EXAM: PORTABLE CHEST 1 VIEW COMPARISON:  Chest CT 07/27/2015 FINDINGS: Seated upright AP portable view at 0952 hours. Right chest porta cath now in place, subclavian approach catheter tip projects at the lower SVC level. No pneumothorax. Allowing for portable technique, the lungs are clear. Mediastinal contours are stable and within normal limits. Visualized tracheal air column is within normal limits. No osseous abnormality identified. IMPRESSION: Right chest subclavian approach porta cath placed with no adverse features. Electronically Signed   By:  Odessa Fleming M.D.   On: 07/31/2015 09:27   Dg Fluoro Guide Cv Line-no Report  07/31/2015  CLINICAL DATA:  FLOURO GUIDE CV LINE Fluoroscopy was utilized by the requesting physician.  No radiographic interpretation.    ASSESSMENT: 50 y.o. [facility] woman status post left breast biopsy 07/10/2015 of 2 separate masses as well as left axillary lymph node, all positive for invasive lobular carcinoma, grade 1, estrogen and progesterone receptor positive, HER-2 not amplified, with an MIB-1 between 5 and 20%  (1) neoadjuvan chemotherapy to consist of cyclophosphamide and docetaxel every 3 weeks 4  (2) definitive surgery to follow, with consideration of the Alliance trial  (3) adjuvant radiation to follow surgery  (4) anti-estrogens to start at the completion of local therapy  PLAN: Emma [name] feels almost back to her baseline. The labs were reviewed in detail and were stable. She will proceed with cycle 2 of cyclophosphamide and docetaxel as planned today.   I reminded her to begin claritin tomorrow and to continue it for 3 days to mitigate bone pain from the neulasta onpro.  Emma [name] will return in 1 week for labs and a nadir visit. She understands and agrees with this plan. She knows the goal of treatment in her case is control. She has been encouraged to call with any issues that might arise before her next visit  here.  Sheffield Slider, NP   08/28/2015 10:18 AM

## 2015-08-30 ENCOUNTER — Ambulatory Visit: Payer: BC Managed Care – PPO

## 2015-09-04 ENCOUNTER — Telehealth: Payer: Self-pay | Admitting: Nurse Practitioner

## 2015-09-04 ENCOUNTER — Encounter: Payer: Self-pay | Admitting: Nurse Practitioner

## 2015-09-04 ENCOUNTER — Ambulatory Visit (HOSPITAL_BASED_OUTPATIENT_CLINIC_OR_DEPARTMENT_OTHER): Payer: BC Managed Care – PPO | Admitting: Nurse Practitioner

## 2015-09-04 ENCOUNTER — Other Ambulatory Visit (HOSPITAL_BASED_OUTPATIENT_CLINIC_OR_DEPARTMENT_OTHER): Payer: BC Managed Care – PPO

## 2015-09-04 VITALS — BP 137/72 | HR 96 | Temp 98.0°F | Resp 18 | Ht 66.0 in | Wt 203.0 lb

## 2015-09-04 DIAGNOSIS — E860 Dehydration: Secondary | ICD-10-CM

## 2015-09-04 DIAGNOSIS — R432 Parageusia: Secondary | ICD-10-CM | POA: Diagnosis not present

## 2015-09-04 DIAGNOSIS — C773 Secondary and unspecified malignant neoplasm of axilla and upper limb lymph nodes: Secondary | ICD-10-CM | POA: Diagnosis not present

## 2015-09-04 DIAGNOSIS — D509 Iron deficiency anemia, unspecified: Secondary | ICD-10-CM | POA: Diagnosis not present

## 2015-09-04 DIAGNOSIS — C50412 Malignant neoplasm of upper-outer quadrant of left female breast: Secondary | ICD-10-CM

## 2015-09-04 LAB — COMPREHENSIVE METABOLIC PANEL
ALT: 14 U/L (ref 0–55)
ANION GAP: 9 meq/L (ref 3–11)
AST: 31 U/L (ref 5–34)
Albumin: 3.8 g/dL (ref 3.5–5.0)
Alkaline Phosphatase: 71 U/L (ref 40–150)
BUN: 11.2 mg/dL (ref 7.0–26.0)
CALCIUM: 9.4 mg/dL (ref 8.4–10.4)
CHLORIDE: 107 meq/L (ref 98–109)
CO2: 25 meq/L (ref 22–29)
Creatinine: 1 mg/dL (ref 0.6–1.1)
EGFR: 72 mL/min/{1.73_m2} — AB (ref >90)
Glucose: 84 mg/dl (ref 70–140)
POTASSIUM: 3.8 meq/L (ref 3.5–5.1)
Sodium: 142 mEq/L (ref 136–145)
Total Bilirubin: 0.67 mg/dL (ref 0.20–1.20)
Total Protein: 7.1 g/dL (ref 6.4–8.3)

## 2015-09-04 LAB — CBC WITH DIFFERENTIAL/PLATELET
BASO%: 0.4 % (ref 0.0–2.0)
Basophils Absolute: 0.1 10*3/uL (ref 0.0–0.1)
EOS ABS: 0 10*3/uL (ref 0.0–0.5)
EOS%: 0.1 % (ref 0.0–7.0)
HEMATOCRIT: 34.5 % — AB (ref 34.8–46.6)
HGB: 10.7 g/dL — ABNORMAL LOW (ref 11.6–15.9)
LYMPH%: 20.6 % (ref 14.0–49.7)
MCH: 23.5 pg — AB (ref 25.1–34.0)
MCHC: 31 g/dL — AB (ref 31.5–36.0)
MCV: 75.8 fL — AB (ref 79.5–101.0)
MONO#: 2.2 10*3/uL — AB (ref 0.1–0.9)
MONO%: 12.6 % (ref 0.0–14.0)
NEUT#: 11.7 10*3/uL — ABNORMAL HIGH (ref 1.5–6.5)
NEUT%: 66.3 % (ref 38.4–76.8)
PLATELETS: 224 10*3/uL (ref 145–400)
RBC: 4.55 10*6/uL (ref 3.70–5.45)
RDW: 19.4 % — ABNORMAL HIGH (ref 11.2–14.5)
WBC: 17.7 10*3/uL — AB (ref 3.9–10.3)
lymph#: 3.7 10*3/uL — ABNORMAL HIGH (ref 0.9–3.3)
nRBC: 2 % — ABNORMAL HIGH (ref 0–0)

## 2015-09-04 NOTE — Telephone Encounter (Signed)
appt made and avs printed °

## 2015-09-04 NOTE — Progress Notes (Signed)
[facility]  Telephone:([phone] Fax:([phone]    ID: Emma [name] DOB: [birth year]  MR#: [id number]  QQV#:[id number]  Patient Care Team: [name], MD as PCP - [name] (Family Medicine) [name], MD as Consulting Physician ([name] Surgery) Lowella Dell, MD as Consulting Physician (Oncology) Antony Blackbird, MD as Consulting Physician (Radiation Oncology) Salomon Fick, NP as Nurse Practitioner (Hematology and Oncology) Levi Aland, MD as Consulting Physician (Obstetrics and Gynecology) PCP: [name], MD OTHER MD:  CHIEF COMPLAINT: Estrogen receptor positive breast cancer  CURRENT TREATMENT: Neoadjuvant chemotherapy  BREAST CANCER HISTORY: Emma [name] had routine screening mammography at Dr. Ewell Poe office/[facility] 06/23/2015. There was an area of distortion in the left breast. The patient was then referred to the Breast Center where on 07/03/2015 he underwent left diagnostic mammography with tomosynthesis and left breast ultrasonography. The breast density was category B. In the far outer left breast there was an area of distortion measuring approximately 4 cm. There appeared to be a prominent left axillary lymph node. On physical exam at the 2:00 position of the left breast 12 cm from the nipple there was an area of approximately 4 cm of firmness. There was no palpable mass in the left axilla. Ultrasonography confirmed an irregular hypoechoic mass in the upper-outer quadrant of the left breast measuring 2.6 cm. A little closer to the axilla there was a second irregular hypoechoic mass measuring 0.5 cm. The distance between these 2 masses was 3.6 cm. In addition there was a right axillary lymph node with a thickened cortex. It measured 0.9 cm.  On 5 or 20 11/30/2015 the patient underwent biopsy of the 2 areas in the breast as well as the axillary lymph node. The larger of the 2 masses was an invasive lobular carcinoma,  E-cadherin negative, estrogen receptor 100% positive, progesterone receptor 100% positive, both with strong staining intensity, with an MIB-1 of 20%. This second, smaller mass, was also an invasive lobular carcinoma, E-cadherin negative, estrogen receptor 90% positive, progesterone receptor 100% positive, both with strong staining intensity, with an MIB-1 of 5%. The right axillary lymph node was also an invasive mammary carcinoma. And also estrogen receptor positive at 70%, progesterone receptor positive at 90%, with an MIB-1 of 10%.  Her subsequent history is as detailed below  INTERVAL HISTORY: Emma [name] returns for follow up of her breast cancer, alone. Today is day 8, cycle 2 of docetaxel and cyclophosphamide, with neulasta onpro for granulocyte support.   REVIEW OF SYSTEMS: Emma [name]'s main complaint today is taste changes that affect her appetite. She denies mouth sores, nausea, or vomiting. Her water intake is down and she has noticed some leg cramping. She is moving her bowels well. She is mildly fatigued, but is keeping up at work. Her bone pain was less sharp since extending her use of the claritin, but she found that it lasted the same amount of time. A detailed review of systems is otherwise stable.  PAST MEDICAL HISTORY: Past Medical History  Diagnosis Date  . Hypertension   . Breast cancer of upper-outer quadrant of left female breast (HCC) 07/13/2015    PAST SURGICAL HISTORY: Past Surgical History  Procedure Laterality Date  . Cesarean section  1998  . Foot surgery  1993    bunionectomy  . Dilation and evacuation  05/01/2011    Procedure: DILATATION AND EVACUATION;  Surgeon: Levi Aland;  Location: [facility];  Service: Gynecology;  Laterality: N/A;  . Portacath placement Right 07/31/2015  Procedure: INSERTION PORT-A-CATH;  Surgeon: [name], MD;  Location: [facility];  Service: [name];  Laterality: Right;    FAMILY HISTORY Family History  Problem Relation  Age of Onset  . Colon cancer Mother   The patient's father died at age 76 from "complicated causes". The patient's mother died from colon cancer at the age of 100. He cancer was diagnosed the year prior. The patient has 3 brothers, 11 sisters. There is no history of breast or ovarian cancer and no other colon cancers in the family  GYNECOLOGIC HISTORY:  No LMP recorded. Menarche age 1, first live birth age 73, which the patient understands increases the risk of breast cancer. She is GX P2. She still having regular periods as of March 2017. She used birth control remotely with no complications.  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 Her husband Emma [name] works for a Midwife is Chiropodist of environmental services. Emma [name] has a child from a prior marriage, Emma [name], who is in the [employer]. Emojean as a child from a prior marriage, Emma [name], who is going to school in [place]. The couple have added son, Emma [name], 71 years old, at home. The patient has one grandchild. She goes to a local [employer].    ADVANCED DIRECTIVES: no   HEALTH MAINTENANCE: Social History  Substance Use Topics  . Smoking status: Never Smoker   . Smokeless tobacco: Not on file  . Alcohol Use: Yes     Comment: social     Colonoscopy: January 2016/[facility]  PAP: February 2017  Bone density: Never  Lipid panel:  No Known Allergies  Current Outpatient Prescriptions  Medication Sig Dispense Refill  . amLODipine (NORVASC) 5 MG tablet Take 5 mg by mouth every morning. Reported on 08/28/2015    . dexamethasone (DECADRON) 4 MG tablet Take 2 tablets (8 mg total) by mouth 2 (two) times daily. Start the day before Taxotere. Then again the day after chemo for 3 days. 30 tablet 1  . HYDROcodone-acetaminophen (NORCO/VICODIN) 5-325 MG tablet Take 1-2 tablets by mouth every 6 (six) hours as needed. (Patient not taking:  Reported on 08/03/2015) 30 tablet 0  . lidocaine-prilocaine (EMLA) cream Apply to affected area once 30 g 3  . LORazepam (ATIVAN) 0.5 MG tablet Take 1 tablet (0.5 mg total) by mouth at bedtime as needed (Nausea or vomiting). 30 tablet 0  . ondansetron (ZOFRAN) 8 MG tablet Take 1 tablet (8 mg total) by mouth 2 (two) times daily as needed for refractory nausea / vomiting. Start on day 3 after chemo. 30 tablet 1  . prochlorperazine (COMPAZINE) 10 MG tablet Take 1 tablet (10 mg total) by mouth every 6 (six) hours as needed (Nausea or vomiting). (Patient not taking: Reported on 08/15/2015) 30 tablet 1   No current facility-administered medications for this visit.    OBJECTIVE: Middle-aged African-American woman in no acute distress Filed Vitals:   09/04/15 1524  BP: 137/72  Pulse: 96  Temp: 98 F (36.7 C)  Resp: 18     Body mass index is 32.78 kg/(m^2).    ECOG FS:0 - Asymptomatic  Sclerae unicteric, pupils round and equal Oropharynx clear and moist-- no thrush or other lesions No cervical or supraclavicular adenopathy Lungs no rales or rhonchi Heart regular rate and rhythm Abd soft, nontender, positive bowel sounds MSK no focal spinal tenderness, no upper extremity lymphedema Neuro: nonfocal, well oriented, appropriate  affect Breasts: deferred  LAB RESULTS:  CMP     Component Value Date/Time   NA 142 09/04/2015 1508   K 3.8 09/04/2015 1508   CO2 25 09/04/2015 1508   GLUCOSE 84 09/04/2015 1508   BUN 11.2 09/04/2015 1508   CREATININE 1.0 09/04/2015 1508   CALCIUM 9.4 09/04/2015 1508   PROT 7.1 09/04/2015 1508   ALBUMIN 3.8 09/04/2015 1508   AST 31 09/04/2015 1508   ALT 14 09/04/2015 1508   ALKPHOS 71 09/04/2015 1508   BILITOT 0.67 09/04/2015 1508    INo results found for: SPEP, UPEP  Lab Results  Component Value Date   WBC 17.7* 09/04/2015   NEUTROABS 11.7* 09/04/2015   HGB 10.7* 09/04/2015   HCT 34.5* 09/04/2015   MCV 75.8* 09/04/2015   PLT 224 09/04/2015       Chemistry      Component Value Date/Time   NA 142 09/04/2015 1508   K 3.8 09/04/2015 1508   CO2 25 09/04/2015 1508   BUN 11.2 09/04/2015 1508   CREATININE 1.0 09/04/2015 1508      Component Value Date/Time   CALCIUM 9.4 09/04/2015 1508   ALKPHOS 71 09/04/2015 1508   AST 31 09/04/2015 1508   ALT 14 09/04/2015 1508   BILITOT 0.67 09/04/2015 1508       No results found for: LABCA2  No components found for: LABCA125  No results for input(s): INR in the last 168 hours.  Urinalysis No results found for: COLORURINE, APPEARANCEUR, LABSPEC, PHURINE, GLUCOSEU, HGBUR, BILIRUBINUR, KETONESUR, PROTEINUR, UROBILINOGEN, NITRITE, LEUKOCYTESUR    ELIGIBLE FOR AVAILABLE RESEARCH PROTOCOL: Alliance  STUDIES: No results found.  ASSESSMENT: 50 y.o. [facility] woman status post left breast biopsy 07/10/2015 of 2 separate masses as well as left axillary lymph node, all positive for invasive lobular carcinoma, grade 1, estrogen and progesterone receptor positive, HER-2 not amplified, with an MIB-1 between 5 and 20%  (1) neoadjuvan chemotherapy to consist of cyclophosphamide and docetaxel every 3 weeks 4  (2) definitive surgery to follow, with consideration of the Alliance trial  (3) adjuvant radiation to follow surgery  (4) anti-estrogens to start at the completion of local therapy  PLAN: Lexiss is feeling fair today. Her main side effects are beginning to resolve. Some of our patient have found success with lemon juice or vinegar on their food, so she will try this to see if it inspires a better taste sensation. The labs were reviewed in detail and were stable. She has continued anemia with an hgb of 10.7. Her ferritin is normal, but on the lower side. I consulted with Dr. Darnelle Catalan and he suggested starting an oral iron supplement. This was discussed with the patient. She knows about the potential for dark stools and constipation. If she is unable to tolerate this, we can  consider IV feraheme.   She is going to increase her water intake, which is the likely source of her leg cramps.  Everlyn will return in 2 weeks for cycle 3 of treatment. She understands and agrees with this plan. She knows the goal of treatment in her case is control. She has been encouraged to call with any issues that might arise before her next visit here.  Sheffield Slider, NP   09/04/2015 4:05 PM

## 2015-09-18 ENCOUNTER — Ambulatory Visit: Payer: BC Managed Care – PPO

## 2015-09-18 ENCOUNTER — Other Ambulatory Visit: Payer: BC Managed Care – PPO

## 2015-09-20 ENCOUNTER — Ambulatory Visit: Payer: BC Managed Care – PPO

## 2015-09-21 ENCOUNTER — Ambulatory Visit (HOSPITAL_BASED_OUTPATIENT_CLINIC_OR_DEPARTMENT_OTHER): Payer: BC Managed Care – PPO

## 2015-09-21 ENCOUNTER — Encounter: Payer: Self-pay | Admitting: Nurse Practitioner

## 2015-09-21 ENCOUNTER — Encounter: Payer: Self-pay | Admitting: *Deleted

## 2015-09-21 ENCOUNTER — Ambulatory Visit (HOSPITAL_BASED_OUTPATIENT_CLINIC_OR_DEPARTMENT_OTHER): Payer: BC Managed Care – PPO | Admitting: Nurse Practitioner

## 2015-09-21 ENCOUNTER — Other Ambulatory Visit (HOSPITAL_BASED_OUTPATIENT_CLINIC_OR_DEPARTMENT_OTHER): Payer: BC Managed Care – PPO

## 2015-09-21 VITALS — BP 150/87 | HR 93 | Temp 98.0°F | Resp 18 | Ht 66.0 in | Wt 208.0 lb

## 2015-09-21 DIAGNOSIS — C50412 Malignant neoplasm of upper-outer quadrant of left female breast: Secondary | ICD-10-CM

## 2015-09-21 DIAGNOSIS — K5900 Constipation, unspecified: Secondary | ICD-10-CM

## 2015-09-21 DIAGNOSIS — Z5189 Encounter for other specified aftercare: Secondary | ICD-10-CM

## 2015-09-21 DIAGNOSIS — G620 Drug-induced polyneuropathy: Secondary | ICD-10-CM | POA: Diagnosis not present

## 2015-09-21 DIAGNOSIS — Z5111 Encounter for antineoplastic chemotherapy: Secondary | ICD-10-CM

## 2015-09-21 DIAGNOSIS — T451X5A Adverse effect of antineoplastic and immunosuppressive drugs, initial encounter: Secondary | ICD-10-CM

## 2015-09-21 LAB — CBC WITH DIFFERENTIAL/PLATELET
BASO%: 0.6 % (ref 0.0–2.0)
Basophils Absolute: 0.1 10*3/uL (ref 0.0–0.1)
EOS%: 0 % (ref 0.0–7.0)
Eosinophils Absolute: 0 10*3/uL (ref 0.0–0.5)
HCT: 37.1 % (ref 34.8–46.6)
HGB: 11.6 g/dL (ref 11.6–15.9)
LYMPH%: 13.6 % — AB (ref 14.0–49.7)
MCH: 24.2 pg — AB (ref 25.1–34.0)
MCHC: 31.3 g/dL — AB (ref 31.5–36.0)
MCV: 77.5 fL — AB (ref 79.5–101.0)
MONO#: 0.1 10*3/uL (ref 0.1–0.9)
MONO%: 0.8 % (ref 0.0–14.0)
NEUT%: 85 % — AB (ref 38.4–76.8)
NEUTROS ABS: 8.3 10*3/uL — AB (ref 1.5–6.5)
Platelets: 397 10*3/uL (ref 145–400)
RBC: 4.79 10*6/uL (ref 3.70–5.45)
RDW: 20.9 % — ABNORMAL HIGH (ref 11.2–14.5)
WBC: 9.7 10*3/uL (ref 3.9–10.3)
lymph#: 1.3 10*3/uL (ref 0.9–3.3)

## 2015-09-21 LAB — COMPREHENSIVE METABOLIC PANEL
ALBUMIN: 3.8 g/dL (ref 3.5–5.0)
ALK PHOS: 58 U/L (ref 40–150)
ALT: <9 U/L (ref 0–55)
AST: 13 U/L (ref 5–34)
Anion Gap: 8 mEq/L (ref 3–11)
BUN: 13.8 mg/dL (ref 7.0–26.0)
CO2: 23 mEq/L (ref 22–29)
Calcium: 9.9 mg/dL (ref 8.4–10.4)
Chloride: 110 mEq/L — ABNORMAL HIGH (ref 98–109)
Creatinine: 0.9 mg/dL (ref 0.6–1.1)
EGFR: 82 mL/min/{1.73_m2} — AB (ref >90)
GLUCOSE: 141 mg/dL — AB (ref 70–140)
POTASSIUM: 4.4 meq/L (ref 3.5–5.1)
SODIUM: 142 meq/L (ref 136–145)
Total Bilirubin: 0.52 mg/dL (ref 0.20–1.20)
Total Protein: 7.8 g/dL (ref 6.4–8.3)

## 2015-09-21 MED ORDER — SODIUM CHLORIDE 0.9 % IV SOLN
10.0000 mg | Freq: Once | INTRAVENOUS | Status: AC
  Administered 2015-09-21: 10 mg via INTRAVENOUS
  Filled 2015-09-21: qty 1

## 2015-09-21 MED ORDER — SODIUM CHLORIDE 0.9 % IV SOLN
75.0000 mg/m2 | Freq: Once | INTRAVENOUS | Status: AC
  Administered 2015-09-21: 160 mg via INTRAVENOUS
  Filled 2015-09-21: qty 16

## 2015-09-21 MED ORDER — SODIUM CHLORIDE 0.9 % IV SOLN
Freq: Once | INTRAVENOUS | Status: AC
  Administered 2015-09-21: 11:00:00 via INTRAVENOUS

## 2015-09-21 MED ORDER — CYCLOPHOSPHAMIDE CHEMO INJECTION 1 GM
600.0000 mg/m2 | Freq: Once | INTRAMUSCULAR | Status: AC
  Administered 2015-09-21: 1260 mg via INTRAVENOUS
  Filled 2015-09-21: qty 63

## 2015-09-21 MED ORDER — HEPARIN SOD (PORK) LOCK FLUSH 100 UNIT/ML IV SOLN
500.0000 [IU] | Freq: Once | INTRAVENOUS | Status: DC | PRN
  Filled 2015-09-21: qty 5

## 2015-09-21 MED ORDER — PALONOSETRON HCL INJECTION 0.25 MG/5ML
0.2500 mg | Freq: Once | INTRAVENOUS | Status: AC
  Administered 2015-09-21: 0.25 mg via INTRAVENOUS

## 2015-09-21 MED ORDER — PALONOSETRON HCL INJECTION 0.25 MG/5ML
INTRAVENOUS | Status: AC
  Filled 2015-09-21: qty 5

## 2015-09-21 MED ORDER — PEGFILGRASTIM 6 MG/0.6ML ~~LOC~~ PSKT
6.0000 mg | PREFILLED_SYRINGE | Freq: Once | SUBCUTANEOUS | Status: AC
  Administered 2015-09-21: 6 mg via SUBCUTANEOUS
  Filled 2015-09-21: qty 0.6

## 2015-09-21 MED ORDER — SODIUM CHLORIDE 0.9% FLUSH
10.0000 mL | INTRAVENOUS | Status: DC | PRN
  Filled 2015-09-21: qty 10

## 2015-09-21 NOTE — Patient Instructions (Signed)
[facility] Discharge Instructions for Patients Receiving Chemotherapy  Today you received the following chemotherapy agents Taxotere/Cytoxan.  To help prevent nausea and vomiting after your treatment, we encourage you to take your nausea medication as directed.   If you develop nausea and vomiting that is not controlled by your nausea medication, call the clinic.   BELOW ARE SYMPTOMS THAT SHOULD BE REPORTED IMMEDIATELY:  *FEVER GREATER THAN 100.5 F  *CHILLS WITH OR WITHOUT FEVER  NAUSEA AND VOMITING THAT IS NOT CONTROLLED WITH YOUR NAUSEA MEDICATION  *UNUSUAL SHORTNESS OF BREATH  *UNUSUAL BRUISING OR BLEEDING  TENDERNESS IN MOUTH AND THROAT WITH OR WITHOUT PRESENCE OF ULCERS  *URINARY PROBLEMS  *BOWEL PROBLEMS  UNUSUAL RASH Items with * indicate a potential emergency and should be followed up as soon as possible.  Feel free to call the clinic you have any questions or concerns. The clinic phone number is [phone].  Please show the [place] at check-in to the Emergency Department and triage nurse.  Pegfilgrastim injection What is this medicine? PEGFILGRASTIM (PEG fil gra stim) is a long-acting granulocyte colony-stimulating factor that stimulates the growth of neutrophils, a type of white blood cell important in the body's fight against infection. It is used to reduce the incidence of fever and infection in patients with certain types of cancer who are receiving chemotherapy that affects the bone marrow, and to increase survival after being exposed to high doses of radiation. This medicine may be used for other purposes; ask your health care provider or pharmacist if you have questions. What should I tell my health care provider before I take this medicine? They need to know if you have any of these conditions: -kidney disease -latex allergy -ongoing radiation therapy -sickle cell disease -skin reactions to acrylic adhesives (On-Body Injector  only) -an unusual or allergic reaction to pegfilgrastim, filgrastim, other medicines, foods, dyes, or preservatives -pregnant or trying to get pregnant -breast-feeding How should I use this medicine? This medicine is for injection under the skin. If you get this medicine at home, you will be taught how to prepare and give the pre-filled syringe or how to use the On-body Injector. Refer to the patient Instructions for Use for detailed instructions. Use exactly as directed. Take your medicine at regular intervals. Do not take your medicine more often than directed. It is important that you put your used needles and syringes in a special sharps container. Do not put them in a trash can. If you do not have a sharps container, call your pharmacist or healthcare provider to get one. Talk to your pediatrician regarding the use of this medicine in children. While this drug may be prescribed for selected conditions, precautions do apply. Overdosage: If you think you have taken too much of this medicine contact a poison control center or emergency room at once. NOTE: This medicine is only for you. Do not share this medicine with others. What if I miss a dose? It is important not to miss your dose. Call your doctor or health care professional if you miss your dose. If you miss a dose due to an On-body Injector failure or leakage, a new dose should be administered as soon as possible using a single prefilled syringe for manual use. What may interact with this medicine? Interactions have not been studied. Give your health care provider a list of all the medicines, herbs, non-prescription drugs, or dietary supplements you use. Also tell them if you smoke, drink alcohol, or  use illegal drugs. Some items may interact with your medicine. This list may not describe all possible interactions. Give your health care provider a list of all the medicines, herbs, non-prescription drugs, or dietary supplements you use. Also  tell them if you smoke, drink alcohol, or use illegal drugs. Some items may interact with your medicine. What should I watch for while using this medicine? You may need blood work done while you are taking this medicine. If you are going to need a MRI, CT scan, or other procedure, tell your doctor that you are using this medicine (On-Body Injector only). What side effects may I notice from receiving this medicine? Side effects that you should report to your doctor or health care professional as soon as possible: -allergic reactions like skin rash, itching or hives, swelling of the face, lips, or tongue -dizziness -fever -pain, redness, or irritation at site where injected -pinpoint red spots on the skin -red or dark-brown urine -shortness of breath or breathing problems -stomach or side pain, or pain at the shoulder -swelling -tiredness -trouble passing urine or change in the amount of urine Side effects that usually do not require medical attention (report to your doctor or health care professional if they continue or are bothersome): -bone pain -muscle pain This list may not describe all possible side effects. Call your doctor for medical advice about side effects. You may report side effects to FDA at 1-800-FDA-1088. Where should I keep my medicine? Keep out of the reach of children. Store pre-filled syringes in a refrigerator between 2 and 8 degrees C (36 and 46 degrees F). Do not freeze. Keep in carton to protect from light. Throw away this medicine if it is left out of the refrigerator for more than 48 hours. Throw away any unused medicine after the expiration date. NOTE: This sheet is a summary. It may not cover all possible information. If you have questions about this medicine, talk to your doctor, pharmacist, or health care provider.    2016, Elsevier/Gold Standard. (2014-05-19 14:30:14)

## 2015-09-21 NOTE — Progress Notes (Addendum)
[facility]  Telephone:(336) [phone] Fax:(336) [phone]   ID: Emma [name] DOB: [birth year]  MR#: [id number]  QVZ#:[id number]  Patient Care Team: [name], MD as PCP - General (Family Medicine) [name], MD as Consulting Physician (General Surgery) Lowella Dell, MD as Consulting Physician (Oncology) Antony Blackbird, MD as Consulting Physician (Radiation Oncology) Salomon Fick, NP as Nurse Practitioner (Hematology and Oncology) Levi Aland, MD as Consulting Physician (Obstetrics and Gynecology) PCP: [name], MD OTHER MD:  CHIEF COMPLAINT: Estrogen receptor positive breast cancer  CURRENT TREATMENT: Neoadjuvant chemotherapy  BREAST CANCER HISTORY: Emma [name] had routine screening mammography at Dr. Ewell Poe office/[facility] 06/23/2015. There was an area of distortion in the left breast. The patient was then referred to the Breast Center where on 07/03/2015 he underwent left diagnostic mammography with tomosynthesis and left breast ultrasonography. The breast density was category B. In the far outer left breast there was an area of distortion measuring approximately 4 cm. There appeared to be a prominent left axillary lymph node. On physical exam at the 2:00 position of the left breast 12 cm from the nipple there was an area of approximately 4 cm of firmness. There was no palpable mass in the left axilla. Ultrasonography confirmed an irregular hypoechoic mass in the upper-outer quadrant of the left breast measuring 2.6 cm. A little closer to the axilla there was a second irregular hypoechoic mass measuring 0.5 cm. The distance between these 2 masses was 3.6 cm. In addition there was a right axillary lymph node with a thickened cortex. It measured 0.9 cm.  On 5 or 20 11/30/2015 the patient underwent biopsy of the 2 areas in the breast as well as the axillary lymph node. The larger of the 2 masses was an invasive lobular carcinoma,  E-cadherin negative, estrogen receptor 100% positive, progesterone receptor 100% positive, both with strong staining intensity, with an MIB-1 of 20%. This second, smaller mass, was also an invasive lobular carcinoma, E-cadherin negative, estrogen receptor 90% positive, progesterone receptor 100% positive, both with strong staining intensity, with an MIB-1 of 5%. The right axillary lymph node was also an invasive mammary carcinoma. And also estrogen receptor positive at 70%, progesterone receptor positive at 90%, with an MIB-1 of 10%.  Her subsequent history is as detailed below  INTERVAL HISTORY: Emma [name] returns for follow up of her breast cancer, accompanied by a coworker. Today is day 1, cycle 3 of docetaxel and cyclophosphamide, with neulasta onpro for granulocyte support.   REVIEW OF SYSTEMS: Emma [name] denies fevers, chills, nausea, or vomiting. She has started on an oral iron supplement. This is making her slightly constipated. Her appetite has improved as her taste sensation is back. She complains of intermittent numbness to her bilateral big toes. She denies pain or numbness. She has no mouth sores or rashes. A detailed review of systems is otherwise stable.  PAST MEDICAL HISTORY: Past Medical History  Diagnosis Date  . Hypertension   . Breast cancer of upper-outer quadrant of left female breast (HCC) 07/13/2015    PAST SURGICAL HISTORY: Past Surgical History  Procedure Laterality Date  . Cesarean section  1998  . Foot surgery  1993    bunionectomy  . Dilation and evacuation  05/01/2011    Procedure: DILATATION AND EVACUATION;  Surgeon: Levi Aland;  Location: [facility];  Service: Gynecology;  Laterality: N/A;  . Portacath placement Right 07/31/2015    Procedure: INSERTION PORT-A-CATH;  Surgeon: [name], MD;  Location: [facility];  Service: General;  Laterality: Right;    FAMILY HISTORY Family History  Problem Relation Age of Onset  . Colon cancer Mother   The  patient's father died at age 57 from "complicated causes". The patient's mother died from colon cancer at the age of 53. He cancer was diagnosed the year prior. The patient has 3 brothers, 11 sisters. There is no history of breast or ovarian cancer and no other colon cancers in the family  GYNECOLOGIC HISTORY:  No LMP recorded. Menarche age 35, first live birth age 39, which the patient understands increases the risk of breast cancer. She is GX P2. She still having regular periods as of March 2017. She used birth control remotely with no complications.  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 Her husband [name] works for a Midwife is Chiropodist of environmental services. [name] has a child from a prior marriage, Joua Vanderkolk, who is in the [employer]. Belvie as a child from a prior marriage, [employer], who is going to school in [place]. The couple have added son, [name], 13 years old, at home. The patient has one grandchild. She goes to a local [employer].    ADVANCED DIRECTIVES: no   HEALTH MAINTENANCE: Social History  Substance Use Topics  . Smoking status: Never Smoker   . Smokeless tobacco: Not on file  . Alcohol Use: Yes     Comment: social     Colonoscopy: January 2016/[facility]  PAP: February 2017  Bone density: Never  Lipid panel:  No Known Allergies  Current Outpatient Prescriptions  Medication Sig Dispense Refill  . amLODipine (NORVASC) 5 MG tablet Take 5 mg by mouth every morning. Reported on 08/28/2015    . dexamethasone (DECADRON) 4 MG tablet Take 2 tablets (8 mg total) by mouth 2 (two) times daily. Start the day before Taxotere. Then again the day after chemo for 3 days. 30 tablet 1  . HYDROcodone-acetaminophen (NORCO/VICODIN) 5-325 MG tablet Take 1-2 tablets by mouth every 6 (six) hours as needed. (Patient not taking: Reported on 08/03/2015) 30 tablet 0  .  lidocaine-prilocaine (EMLA) cream Apply to affected area once 30 g 3  . LORazepam (ATIVAN) 0.5 MG tablet Take 1 tablet (0.5 mg total) by mouth at bedtime as needed (Nausea or vomiting). 30 tablet 0  . ondansetron (ZOFRAN) 8 MG tablet Take 1 tablet (8 mg total) by mouth 2 (two) times daily as needed for refractory nausea / vomiting. Start on day 3 after chemo. 30 tablet 1  . prochlorperazine (COMPAZINE) 10 MG tablet Take 1 tablet (10 mg total) by mouth every 6 (six) hours as needed (Nausea or vomiting). (Patient not taking: Reported on 08/15/2015) 30 tablet 1   No current facility-administered medications for this visit.    OBJECTIVE: Middle-aged African-American woman in no acute distress Filed Vitals:   09/21/15 0917  BP: 150/87  Pulse: 93  Temp: 98 F (36.7 C)  Resp: 18     Body mass index is 33.59 kg/(m^2).    ECOG FS:0 - Asymptomatic  Skin: warm, dry  HEENT: sclerae anicteric, conjunctivae pink, oropharynx clear. No thrush or mucositis.  Lymph Nodes: No cervical or supraclavicular lymphadenopathy  Lungs: clear to auscultation bilaterally, no rales, wheezes, or rhonci  Heart: regular rate and rhythm, audible systolic murmur. Abdomen: round, soft, non tender, positive bowel sounds  Musculoskeletal: No focal spinal tenderness, no peripheral edema  Neuro: non  focal, well oriented, positive affect  Breasts: deferred  LAB RESULTS:  CMP     Component Value Date/Time   NA 142 09/04/2015 1508   K 3.8 09/04/2015 1508   CO2 25 09/04/2015 1508   GLUCOSE 84 09/04/2015 1508   BUN 11.2 09/04/2015 1508   CREATININE 1.0 09/04/2015 1508   CALCIUM 9.4 09/04/2015 1508   PROT 7.1 09/04/2015 1508   ALBUMIN 3.8 09/04/2015 1508   AST 31 09/04/2015 1508   ALT 14 09/04/2015 1508   ALKPHOS 71 09/04/2015 1508   BILITOT 0.67 09/04/2015 1508    INo results found for: SPEP, UPEP  Lab Results  Component Value Date   WBC 9.7 09/21/2015   NEUTROABS 8.3* 09/21/2015   HGB 11.6 09/21/2015   HCT  37.1 09/21/2015   MCV 77.5* 09/21/2015   PLT 397 09/21/2015      Chemistry      Component Value Date/Time   NA 142 09/04/2015 1508   K 3.8 09/04/2015 1508   CO2 25 09/04/2015 1508   BUN 11.2 09/04/2015 1508   CREATININE 1.0 09/04/2015 1508      Component Value Date/Time   CALCIUM 9.4 09/04/2015 1508   ALKPHOS 71 09/04/2015 1508   AST 31 09/04/2015 1508   ALT 14 09/04/2015 1508   BILITOT 0.67 09/04/2015 1508      No results found for: LABCA2  No components found for: LABCA125  No results for input(s): INR in the last 168 hours.  Urinalysis No results found for: COLORURINE, APPEARANCEUR, LABSPEC, PHURINE, GLUCOSEU, HGBUR, BILIRUBINUR, KETONESUR, PROTEINUR, UROBILINOGEN, NITRITE, LEUKOCYTESUR   ELIGIBLE FOR AVAILABLE RESEARCH PROTOCOL: Alliance  STUDIES: No results found.  ASSESSMENT: 50 y.o. [facility] woman status post left breast biopsy 07/10/2015 of 2 separate masses as well as left axillary lymph node, all positive for invasive lobular carcinoma, grade 1, estrogen and progesterone receptor positive, HER-2 not amplified, with an MIB-1 between 5 and 20%  (1) neoadjuvan chemotherapy to consist of cyclophosphamide and docetaxel every 3 weeks 4  (2) definitive surgery to follow, with consideration of the Alliance trial  (3) adjuvant radiation to follow surgery  (4) anti-estrogens to start at the completion of local therapy  PLAN: Laklyn looks and feels well today. I discussed the numbness to her bilateral toes with Dr. Darnelle Catalan. He suggest proceeding with cycle 3 of cyclophosphamide and docetaxel as planned. She will avoid wearing heels to work this week to see if this makes a difference.   The labs were reviewed in detail and were stable. Her hgb is back to normal. She will start taking stool softeners with her iron supplements.   Olwen will return in 1 week for labs and a nadir visit. She understands and agrees with this plan. She knows the goal of  treatment in her case is control. She has been encouraged to call with any issues that might arise before her next visit here.  Emma Slider, NP   09/21/2015 9:40 AM

## 2015-09-28 ENCOUNTER — Other Ambulatory Visit (HOSPITAL_BASED_OUTPATIENT_CLINIC_OR_DEPARTMENT_OTHER): Payer: BC Managed Care – PPO

## 2015-09-28 ENCOUNTER — Encounter: Payer: Self-pay | Admitting: Nurse Practitioner

## 2015-09-28 ENCOUNTER — Ambulatory Visit (HOSPITAL_BASED_OUTPATIENT_CLINIC_OR_DEPARTMENT_OTHER): Payer: BC Managed Care – PPO | Admitting: Nurse Practitioner

## 2015-09-28 VITALS — BP 104/71 | HR 90 | Temp 99.3°F | Resp 18 | Ht 66.0 in | Wt 204.2 lb

## 2015-09-28 DIAGNOSIS — C50412 Malignant neoplasm of upper-outer quadrant of left female breast: Secondary | ICD-10-CM

## 2015-09-28 DIAGNOSIS — D509 Iron deficiency anemia, unspecified: Secondary | ICD-10-CM | POA: Diagnosis not present

## 2015-09-28 DIAGNOSIS — C773 Secondary and unspecified malignant neoplasm of axilla and upper limb lymph nodes: Secondary | ICD-10-CM

## 2015-09-28 LAB — CBC WITH DIFFERENTIAL/PLATELET
BASO%: 0.4 % (ref 0.0–2.0)
Basophils Absolute: 0 10*3/uL (ref 0.0–0.1)
EOS ABS: 0 10*3/uL (ref 0.0–0.5)
EOS%: 0.3 % (ref 0.0–7.0)
HEMATOCRIT: 31.6 % — AB (ref 34.8–46.6)
HEMOGLOBIN: 10 g/dL — AB (ref 11.6–15.9)
LYMPH#: 3 10*3/uL (ref 0.9–3.3)
LYMPH%: 26.6 % (ref 14.0–49.7)
MCH: 24.5 pg — ABNORMAL LOW (ref 25.1–34.0)
MCHC: 31.7 g/dL (ref 31.5–36.0)
MCV: 77.1 fL — AB (ref 79.5–101.0)
MONO#: 1.8 10*3/uL — AB (ref 0.1–0.9)
MONO%: 16.4 % — ABNORMAL HIGH (ref 0.0–14.0)
NEUT%: 56.3 % (ref 38.4–76.8)
NEUTROS ABS: 6.4 10*3/uL (ref 1.5–6.5)
PLATELETS: 214 10*3/uL (ref 145–400)
RBC: 4.1 10*6/uL (ref 3.70–5.45)
RDW: 21.1 % — ABNORMAL HIGH (ref 11.2–14.5)
WBC: 11.3 10*3/uL — AB (ref 3.9–10.3)

## 2015-09-28 LAB — COMPREHENSIVE METABOLIC PANEL
ANION GAP: 5 meq/L (ref 3–11)
AST: 24 U/L (ref 5–34)
Albumin: 3.5 g/dL (ref 3.5–5.0)
Alkaline Phosphatase: 60 U/L (ref 40–150)
BILIRUBIN TOTAL: 0.46 mg/dL (ref 0.20–1.20)
BUN: 8.9 mg/dL (ref 7.0–26.0)
CALCIUM: 9.2 mg/dL (ref 8.4–10.4)
CO2: 26 meq/L (ref 22–29)
CREATININE: 0.9 mg/dL (ref 0.6–1.1)
Chloride: 109 mEq/L (ref 98–109)
EGFR: 82 mL/min/{1.73_m2} — ABNORMAL LOW (ref >90)
Glucose: 93 mg/dl (ref 70–140)
Potassium: 4.4 mEq/L (ref 3.5–5.1)
Sodium: 141 mEq/L (ref 136–145)
TOTAL PROTEIN: 6.7 g/dL (ref 6.4–8.3)

## 2015-09-28 LAB — TECHNOLOGIST REVIEW: Technologist Review: 3

## 2015-09-28 NOTE — Progress Notes (Signed)
Emma [name]  [phone] Fax:(336) [phone]   ID: Emma [name] DOB: 26-Apr-1966  MR#: [id number]  MWU#:[id number]  Patient Care Team: [name], MD as PCP - General (Family Medicine) [name], MD as Consulting Physician (General Surgery) Lowella Dell, MD as Consulting Physician (Oncology) Antony Blackbird, MD as Consulting Physician (Radiation Oncology) Salomon Fick, NP as Nurse Practitioner (Hematology and Oncology) Levi Aland, MD as Consulting Physician (Obstetrics and Gynecology) PCP: [name], MD OTHER MD:  CHIEF COMPLAINT: Estrogen receptor positive breast cancer  CURRENT TREATMENT: Neoadjuvant chemotherapy  BREAST CANCER HISTORY: Emma [name] had routine screening mammography at Dr. Ewell Poe office/[name] 06/23/2015. There was an area of distortion in the left breast. The patient was then referred to the [name] where on 07/03/2015 he underwent left diagnostic mammography with tomosynthesis and left breast ultrasonography. The breast density was category B. In the far outer left breast there was an area of distortion measuring approximately 4 cm. There appeared to be a prominent left axillary lymph node. On physical exam at the 2:00 position of the left breast 12 cm from the nipple there was an area of approximately 4 cm of firmness. There was no palpable mass in the left axilla. Ultrasonography confirmed an irregular hypoechoic mass in the upper-outer quadrant of the left breast measuring 2.6 cm. A little closer to the axilla there was a second irregular hypoechoic mass measuring 0.5 cm. The distance between these 2 masses was 3.6 cm. In addition there was a right axillary lymph node with a thickened cortex. It measured 0.9 cm.  On 5 or 20 11/30/2015 the patient underwent biopsy of the 2 areas in the breast as well as the axillary lymph node. The larger of the 2 masses was an invasive lobular carcinoma,  E-cadherin negative, estrogen receptor 100% positive, progesterone receptor 100% positive, both with strong staining intensity, with an MIB-1 of 20%. This second, smaller mass, was also an invasive lobular carcinoma, E-cadherin negative, estrogen receptor 90% positive, progesterone receptor 100% positive, both with strong staining intensity, with an MIB-1 of 5%. The right axillary lymph node was also an invasive mammary carcinoma. And also estrogen receptor positive at 70%, progesterone receptor positive at 90%, with an MIB-1 of 10%.  Her subsequent history is as detailed below  INTERVAL HISTORY: Emma [name] returns for follow up of her breast cancer, accompanied by her husband. Today is day 8, cycle 3 of docetaxel and cyclophosphamide, with neulasta onpro for granulocyte support.   REVIEW OF SYSTEMS: [name] had a comparable week to the previous treatment with no new issues. She has continued intermittent numbness to 2 of her right toes, but this is no worse. She has not stopped wearing heels to work. She denies fevers or chills. Her nausea is managed with ginger ale. She is moving her bowels well, but she has also stopped her oral iron supplement. Her energy level is fair. She sleeps well at night. A detailed review of systems is otherwise stable.  PAST MEDICAL HISTORY: Past Medical History  Diagnosis Date  . Hypertension   . Breast cancer of upper-outer quadrant of left female breast (HCC) 07/13/2015    PAST SURGICAL HISTORY: Past Surgical History  Procedure Laterality Date  . Cesarean section  1998  . Foot surgery  1993    bunionectomy  . Dilation and evacuation  05/01/2011    Procedure: DILATATION AND EVACUATION;  Surgeon: Levi Aland;  Location: [facility];  Service: Gynecology;  Laterality: N/A;  .  Portacath placement Right 07/31/2015    Procedure: INSERTION PORT-A-CATH;  Surgeon: [name], MD;  Location: [name];  Service: General;  Laterality: Right;    FAMILY  HISTORY Family History  Problem Relation Age of Onset  . Colon cancer Mother   The patient's father died at age 30 from "complicated causes". The patient's mother died from colon cancer at the age of 38. He cancer was diagnosed the year prior. The patient has 3 brothers, 11 sisters. There is no history of breast or ovarian cancer and no other colon cancers in the family  GYNECOLOGIC HISTORY:  No LMP recorded. Menarche age 50, first live birth age 50, which the patient understands increases the risk of breast cancer. She is GX P2. She still having regular periods as of March 2017. She used birth control remotely with no complications.  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 Her husband [name] works for a Midwife is Chiropodist of environmental services. [name] has a child from a prior marriage, [name], who is in the [employer]. [name] as a child from a prior marriage, [employer], who is going to school in [place]. The couple have added son, [name], 77 years old, at home. The patient has one grandchild. She goes to a local [employer].    ADVANCED DIRECTIVES: no   HEALTH MAINTENANCE: Social History  Substance Use Topics  . Smoking status: Never Smoker   . Smokeless tobacco: Not on file  . Alcohol Use: Yes     Comment: social     Colonoscopy: January 2016/[facility]  PAP: February 2017  Bone density: Never  Lipid panel:  No Known Allergies  Current Outpatient Prescriptions  Medication Sig Dispense Refill  . amLODipine (NORVASC) 5 MG tablet Take 5 mg by mouth every morning. Reported on 08/28/2015    . dexamethasone (DECADRON) 4 MG tablet Take 2 tablets (8 mg total) by mouth 2 (two) times daily. Start the day before Taxotere. Then again the day after chemo for 3 days. 30 tablet 1  . lidocaine-prilocaine (EMLA) cream Apply to affected area once 30 g 3  .  HYDROcodone-acetaminophen (NORCO/VICODIN) 5-325 MG tablet Take 1-2 tablets by mouth every 6 (six) hours as needed. (Patient not taking: Reported on 08/03/2015) 30 tablet 0  . LORazepam (ATIVAN) 0.5 MG tablet Take 1 tablet (0.5 mg total) by mouth at bedtime as needed (Nausea or vomiting). (Patient not taking: Reported on 09/28/2015) 30 tablet 0  . ondansetron (ZOFRAN) 8 MG tablet Take 1 tablet (8 mg total) by mouth 2 (two) times daily as needed for refractory nausea / vomiting. Start on day 3 after chemo. (Patient not taking: Reported on 09/28/2015) 30 tablet 1  . prochlorperazine (COMPAZINE) 10 MG tablet Take 1 tablet (10 mg total) by mouth every 6 (six) hours as needed (Nausea or vomiting). (Patient not taking: Reported on 08/15/2015) 30 tablet 1   No current facility-administered medications for this visit.    OBJECTIVE: Middle-aged African-American woman in no acute distress Filed Vitals:   09/28/15 1457  BP: 104/71  Pulse: 90  Temp: 99.3 F (37.4 C)  Resp: 18     Body mass index is 32.97 kg/(m^2).    ECOG FS:0 - Asymptomatic  Sclerae unicteric, pupils round and equal Oropharynx clear and moist-- no thrush or other lesions No cervical or supraclavicular adenopathy Lungs no rales or rhonchi Heart regular rate and rhythm Abd  soft, nontender, positive bowel sounds MSK no focal spinal tenderness, no upper extremity lymphedema Neuro: nonfocal, well oriented, appropriate affect Breasts: deferred  LAB RESULTS:  CMP     Component Value Date/Time   NA 141 09/28/2015 1449   K 4.4 09/28/2015 1449   CO2 26 09/28/2015 1449   GLUCOSE 93 09/28/2015 1449   BUN 8.9 09/28/2015 1449   CREATININE 0.9 09/28/2015 1449   CALCIUM 9.2 09/28/2015 1449   PROT 6.7 09/28/2015 1449   ALBUMIN 3.5 09/28/2015 1449   AST 24 09/28/2015 1449   ALT <9 09/28/2015 1449   ALKPHOS 60 09/28/2015 1449   BILITOT 0.46 09/28/2015 1449    INo results found for: SPEP, UPEP  Lab Results  Component Value Date   WBC  11.3* 09/28/2015   NEUTROABS 6.4 09/28/2015   HGB 10.0* 09/28/2015   HCT 31.6* 09/28/2015   MCV 77.1* 09/28/2015   PLT 214 09/28/2015      Chemistry      Component Value Date/Time   NA 141 09/28/2015 1449   K 4.4 09/28/2015 1449   CO2 26 09/28/2015 1449   BUN 8.9 09/28/2015 1449   CREATININE 0.9 09/28/2015 1449      Component Value Date/Time   CALCIUM 9.2 09/28/2015 1449   ALKPHOS 60 09/28/2015 1449   AST 24 09/28/2015 1449   ALT <9 09/28/2015 1449   BILITOT 0.46 09/28/2015 1449      No results found for: LABCA2  No components found for: LABCA125  No results for input(s): INR in the last 168 hours.  Urinalysis No results found for: COLORURINE, APPEARANCEUR, LABSPEC, PHURINE, GLUCOSEU, HGBUR, BILIRUBINUR, KETONESUR, PROTEINUR, UROBILINOGEN, NITRITE, LEUKOCYTESUR   ELIGIBLE FOR AVAILABLE RESEARCH PROTOCOL: Alliance  STUDIES: No results found.  ASSESSMENT: 50 y.o. [facility] woman status post left breast biopsy 07/10/2015 of 2 separate masses as well as left axillary lymph node, all positive for invasive lobular carcinoma, grade 1, estrogen and progesterone receptor positive, HER-2 not amplified, with an MIB-1 between 5 and 20%  (1) neoadjuvan chemotherapy to consist of cyclophosphamide and docetaxel every 3 weeks 4  (2) definitive surgery to follow, with consideration of the Alliance trial  (3) adjuvant radiation to follow surgery  (4) anti-estrogens to start at the completion of local therapy  PLAN: Dewanna has no new complaints to offer today. The labs were reviewed in detail and she demonstrates worsening anemia. I have asked her to return to use of her oral iron supplements.   She is going to go ahead and call for her final breast MRI. I have placed a referral to Dr. Allene Pyo office for her surgical consultation visit to follow.   Angelyca will return in 2 weeks for her 4th and final treatment. She understands and agrees with this plan. She knows  the goal of treatment in her case is control. She has been encouraged to call with any issues that might arise before her next visit here.  Sheffield Slider, NP   09/28/2015 3:55 PM

## 2015-09-29 ENCOUNTER — Encounter: Payer: Self-pay | Admitting: *Deleted

## 2015-10-10 ENCOUNTER — Ambulatory Visit: Payer: BC Managed Care – PPO

## 2015-10-10 ENCOUNTER — Other Ambulatory Visit: Payer: BC Managed Care – PPO

## 2015-10-12 ENCOUNTER — Ambulatory Visit (HOSPITAL_BASED_OUTPATIENT_CLINIC_OR_DEPARTMENT_OTHER): Payer: BC Managed Care – PPO | Admitting: Nurse Practitioner

## 2015-10-12 ENCOUNTER — Ambulatory Visit (HOSPITAL_BASED_OUTPATIENT_CLINIC_OR_DEPARTMENT_OTHER): Payer: BC Managed Care – PPO

## 2015-10-12 ENCOUNTER — Other Ambulatory Visit (HOSPITAL_BASED_OUTPATIENT_CLINIC_OR_DEPARTMENT_OTHER): Payer: BC Managed Care – PPO

## 2015-10-12 ENCOUNTER — Encounter: Payer: Self-pay | Admitting: *Deleted

## 2015-10-12 ENCOUNTER — Encounter: Payer: Self-pay | Admitting: Nurse Practitioner

## 2015-10-12 ENCOUNTER — Telehealth: Payer: Self-pay | Admitting: Nurse Practitioner

## 2015-10-12 VITALS — BP 143/83 | HR 90 | Temp 98.1°F | Resp 18 | Wt 211.6 lb

## 2015-10-12 DIAGNOSIS — Z5189 Encounter for other specified aftercare: Secondary | ICD-10-CM | POA: Diagnosis not present

## 2015-10-12 DIAGNOSIS — C50412 Malignant neoplasm of upper-outer quadrant of left female breast: Secondary | ICD-10-CM

## 2015-10-12 DIAGNOSIS — Z5111 Encounter for antineoplastic chemotherapy: Secondary | ICD-10-CM

## 2015-10-12 DIAGNOSIS — C773 Secondary and unspecified malignant neoplasm of axilla and upper limb lymph nodes: Secondary | ICD-10-CM

## 2015-10-12 LAB — COMPREHENSIVE METABOLIC PANEL
ANION GAP: 7 meq/L (ref 3–11)
AST: 13 U/L (ref 5–34)
Albumin: 3.6 g/dL (ref 3.5–5.0)
Alkaline Phosphatase: 55 U/L (ref 40–150)
BUN: 17.4 mg/dL (ref 7.0–26.0)
CHLORIDE: 109 meq/L (ref 98–109)
CO2: 24 meq/L (ref 22–29)
CREATININE: 1 mg/dL (ref 0.6–1.1)
Calcium: 9.5 mg/dL (ref 8.4–10.4)
EGFR: 77 mL/min/{1.73_m2} — ABNORMAL LOW (ref >90)
Glucose: 145 mg/dl — ABNORMAL HIGH (ref 70–140)
Potassium: 4.4 mEq/L (ref 3.5–5.1)
Sodium: 140 mEq/L (ref 136–145)
Total Bilirubin: 0.57 mg/dL (ref 0.20–1.20)
Total Protein: 7.4 g/dL (ref 6.4–8.3)

## 2015-10-12 LAB — CBC WITH DIFFERENTIAL/PLATELET
BASO%: 0.4 % (ref 0.0–2.0)
Basophils Absolute: 0.1 10*3/uL (ref 0.0–0.1)
EOS%: 0 % (ref 0.0–7.0)
Eosinophils Absolute: 0 10*3/uL (ref 0.0–0.5)
HCT: 35.3 % (ref 34.8–46.6)
HGB: 11 g/dL — ABNORMAL LOW (ref 11.6–15.9)
LYMPH%: 9.1 % — AB (ref 14.0–49.7)
MCH: 24.3 pg — ABNORMAL LOW (ref 25.1–34.0)
MCHC: 31.1 g/dL — AB (ref 31.5–36.0)
MCV: 78.1 fL — ABNORMAL LOW (ref 79.5–101.0)
MONO#: 0.1 10*3/uL (ref 0.1–0.9)
MONO%: 0.9 % (ref 0.0–14.0)
NEUT#: 11.9 10*3/uL — ABNORMAL HIGH (ref 1.5–6.5)
NEUT%: 89.6 % — AB (ref 38.4–76.8)
PLATELETS: 363 10*3/uL (ref 145–400)
RBC: 4.51 10*6/uL (ref 3.70–5.45)
RDW: 22.3 % — ABNORMAL HIGH (ref 11.2–14.5)
WBC: 13.2 10*3/uL — ABNORMAL HIGH (ref 3.9–10.3)
lymph#: 1.2 10*3/uL (ref 0.9–3.3)

## 2015-10-12 MED ORDER — SODIUM CHLORIDE 0.9 % IV SOLN
75.0000 mg/m2 | Freq: Once | INTRAVENOUS | Status: AC
  Administered 2015-10-12: 160 mg via INTRAVENOUS
  Filled 2015-10-12: qty 16

## 2015-10-12 MED ORDER — PALONOSETRON HCL INJECTION 0.25 MG/5ML
0.2500 mg | Freq: Once | INTRAVENOUS | Status: AC
  Administered 2015-10-12: 0.25 mg via INTRAVENOUS

## 2015-10-12 MED ORDER — PALONOSETRON HCL INJECTION 0.25 MG/5ML
INTRAVENOUS | Status: AC
  Filled 2015-10-12: qty 5

## 2015-10-12 MED ORDER — SODIUM CHLORIDE 0.9 % IV SOLN
600.0000 mg/m2 | Freq: Once | INTRAVENOUS | Status: AC
  Administered 2015-10-12: 1260 mg via INTRAVENOUS
  Filled 2015-10-12: qty 63

## 2015-10-12 MED ORDER — HEPARIN SOD (PORK) LOCK FLUSH 100 UNIT/ML IV SOLN
500.0000 [IU] | Freq: Once | INTRAVENOUS | Status: AC | PRN
  Administered 2015-10-12: 500 [IU]
  Filled 2015-10-12: qty 5

## 2015-10-12 MED ORDER — PEGFILGRASTIM 6 MG/0.6ML ~~LOC~~ PSKT
6.0000 mg | PREFILLED_SYRINGE | Freq: Once | SUBCUTANEOUS | Status: AC
  Administered 2015-10-12: 6 mg via SUBCUTANEOUS
  Filled 2015-10-12: qty 0.6

## 2015-10-12 MED ORDER — SODIUM CHLORIDE 0.9 % IV SOLN
10.0000 mg | Freq: Once | INTRAVENOUS | Status: AC
  Administered 2015-10-12: 10 mg via INTRAVENOUS
  Filled 2015-10-12: qty 1

## 2015-10-12 MED ORDER — SODIUM CHLORIDE 0.9% FLUSH
10.0000 mL | INTRAVENOUS | Status: DC | PRN
  Administered 2015-10-12: 10 mL
  Filled 2015-10-12: qty 10

## 2015-10-12 MED ORDER — SODIUM CHLORIDE 0.9 % IV SOLN
Freq: Once | INTRAVENOUS | Status: AC
  Administered 2015-10-12: 13:00:00 via INTRAVENOUS

## 2015-10-12 NOTE — Progress Notes (Signed)
[facility]  Telephone:(336) [phone] Fax:(336) [phone]   ID: Nicole Cella DOB: 16-Jan-1966  MR#: [id number]  IEP#:[id number]  Patient Care Team: [name], MD as PCP - General (Family Medicine) [name], MD as Consulting Physician (General Surgery) Lowella Dell, MD as Consulting Physician (Oncology) Antony Blackbird, MD as Consulting Physician (Radiation Oncology) Salomon Fick, NP as Nurse Practitioner (Hematology and Oncology) Levi Aland, MD as Consulting Physician (Obstetrics and Gynecology) PCP: [name], MD OTHER MD:  CHIEF COMPLAINT: Estrogen receptor positive breast cancer  CURRENT TREATMENT: Neoadjuvant chemotherapy  BREAST CANCER HISTORY: Lakyah had routine screening mammography at Dr. Ewell Poe office/[facility] 06/23/2015. There was an area of distortion in the left breast. The patient was then referred to the [facility] where on 07/03/2015 he underwent left diagnostic mammography with tomosynthesis and left breast ultrasonography. The breast density was category B. In the far outer left breast there was an area of distortion measuring approximately 4 cm. There appeared to be a prominent left axillary lymph node. On physical exam at the 2:00 position of the left breast 12 cm from the nipple there was an area of approximately 4 cm of firmness. There was no palpable mass in the left axilla. Ultrasonography confirmed an irregular hypoechoic mass in the upper-outer quadrant of the left breast measuring 2.6 cm. A little closer to the axilla there was a second irregular hypoechoic mass measuring 0.5 cm. The distance between these 2 masses was 3.6 cm. In addition there was a right axillary lymph node with a thickened cortex. It measured 0.9 cm.  On 5 or 20 11/30/2015 the patient underwent biopsy of the 2 areas in the breast as well as the axillary lymph node. The larger of the 2 masses was an invasive lobular carcinoma,  E-cadherin negative, estrogen receptor 100% positive, progesterone receptor 100% positive, both with strong staining intensity, with an MIB-1 of 20%. This second, smaller mass, was also an invasive lobular carcinoma, E-cadherin negative, estrogen receptor 90% positive, progesterone receptor 100% positive, both with strong staining intensity, with an MIB-1 of 5%. The right axillary lymph node was also an invasive mammary carcinoma. And also estrogen receptor positive at 70%, progesterone receptor positive at 90%, with an MIB-1 of 10%.  Her subsequent history is as detailed below  INTERVAL HISTORY: Starkisha returns for follow up of her breast cancer, accompanied by her husband. Today is day 1, cycle 4 of docetaxel and cyclophosphamide, with neulasta onpro for granulocyte support.   REVIEW OF SYSTEMS: Laquishia has no new complaints today. She denies fevers, chills, nausea, or vomiting. She is moving her bowels well. Her appetite is good. She denies mouth sores or rashes. The intermittent numbness to 2 of her right toes is stable. Her energy level is good, and she sleeps well at night. A detailed review of systems is otherwise stable.   PAST MEDICAL HISTORY: Past Medical History  Diagnosis Date  . Hypertension   . Breast cancer of upper-outer quadrant of left female breast (HCC) 07/13/2015    PAST SURGICAL HISTORY: Past Surgical History  Procedure Laterality Date  . Cesarean section  1998  . Foot surgery  1993    bunionectomy  . Dilation and evacuation  05/01/2011    Procedure: DILATATION AND EVACUATION;  Surgeon: Levi Aland;  Location: [facility];  Service: Gynecology;  Laterality: N/A;  . Portacath placement Right 07/31/2015    Procedure: INSERTION PORT-A-CATH;  Surgeon: [name], MD;  Location: [facility];  Service: General;  Laterality: Right;    FAMILY HISTORY Family History  Problem Relation Age of Onset  . Colon cancer Mother   The patient's father died at age 72 from  "complicated causes". The patient's mother died from colon cancer at the age of 25. He cancer was diagnosed the year prior. The patient has 3 brothers, 11 sisters. There is no history of breast or ovarian cancer and no other colon cancers in the family  GYNECOLOGIC HISTORY:  No LMP recorded. Menarche age 18, first live birth age 49, which the patient understands increases the risk of breast cancer. She is GX P2. She still having regular periods as of March 2017. She used birth control remotely with no complications.  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 Her husband [name] works for a Midwife is Chiropodist of environmental services. [name] has a child from a prior marriage, Kylla Lewkowicz, who is in the [employer]. Trent as a child from a prior marriage, [employer], who is going to school in [place]. The couple have added son, [name], 20 years old, at home. The patient has one grandchild. She goes to a local [employer].    ADVANCED DIRECTIVES: no   HEALTH MAINTENANCE: Social History  Substance Use Topics  . Smoking status: Never Smoker   . Smokeless tobacco: Not on file  . Alcohol Use: Yes     Comment: social     Colonoscopy: January 2016/[facility]  PAP: February 2017  Bone density: Never  Lipid panel:  No Known Allergies  Current Outpatient Prescriptions  Medication Sig Dispense Refill  . amLODipine (NORVASC) 5 MG tablet Take 5 mg by mouth every morning. Reported on 08/28/2015    . dexamethasone (DECADRON) 4 MG tablet Take 2 tablets (8 mg total) by mouth 2 (two) times daily. Start the day before Taxotere. Then again the day after chemo for 3 days. 30 tablet 1  . HYDROcodone-acetaminophen (NORCO/VICODIN) 5-325 MG tablet Take 1-2 tablets by mouth every 6 (six) hours as needed. (Patient not taking: Reported on 08/03/2015) 30 tablet 0  . lidocaine-prilocaine (EMLA) cream Apply  to affected area once 30 g 3  . LORazepam (ATIVAN) 0.5 MG tablet Take 1 tablet (0.5 mg total) by mouth at bedtime as needed (Nausea or vomiting). (Patient not taking: Reported on 09/28/2015) 30 tablet 0  . ondansetron (ZOFRAN) 8 MG tablet Take 1 tablet (8 mg total) by mouth 2 (two) times daily as needed for refractory nausea / vomiting. Start on day 3 after chemo. (Patient not taking: Reported on 09/28/2015) 30 tablet 1  . prochlorperazine (COMPAZINE) 10 MG tablet Take 1 tablet (10 mg total) by mouth every 6 (six) hours as needed (Nausea or vomiting). (Patient not taking: Reported on 08/15/2015) 30 tablet 1   No current facility-administered medications for this visit.    OBJECTIVE: Middle-aged African-American woman in no acute distress Filed Vitals:   10/12/15 1146  BP: 143/83  Pulse: 90  Temp: 98.1 F (36.7 C)  Resp: 18     Body mass index is 34.17 kg/(m^2).    ECOG FS:0 - Asymptomatic  Skin: warm, dry  HEENT: sclerae anicteric, conjunctivae pink, oropharynx clear. No thrush or mucositis.  Lymph Nodes: No cervical or supraclavicular lymphadenopathy  Lungs: clear to auscultation bilaterally, no rales, wheezes, or rhonci  Heart: regular rate and rhythm  Abdomen: round, soft, non tender, positive bowel sounds  Musculoskeletal: No focal  spinal tenderness, no peripheral edema  Neuro: non focal, well oriented, positive affect  Breasts: deferred  LAB RESULTS:  CMP     Component Value Date/Time   NA 141 09/28/2015 1449   K 4.4 09/28/2015 1449   CO2 26 09/28/2015 1449   GLUCOSE 93 09/28/2015 1449   BUN 8.9 09/28/2015 1449   CREATININE 0.9 09/28/2015 1449   CALCIUM 9.2 09/28/2015 1449   PROT 6.7 09/28/2015 1449   ALBUMIN 3.5 09/28/2015 1449   AST 24 09/28/2015 1449   ALT <9 09/28/2015 1449   ALKPHOS 60 09/28/2015 1449   BILITOT 0.46 09/28/2015 1449    INo results found for: SPEP, UPEP  Lab Results  Component Value Date   WBC 13.2* 10/12/2015   NEUTROABS 11.9* 10/12/2015    HGB 11.0* 10/12/2015   HCT 35.3 10/12/2015   MCV 78.1* 10/12/2015   PLT 363 10/12/2015      Chemistry      Component Value Date/Time   NA 141 09/28/2015 1449   K 4.4 09/28/2015 1449   CO2 26 09/28/2015 1449   BUN 8.9 09/28/2015 1449   CREATININE 0.9 09/28/2015 1449      Component Value Date/Time   CALCIUM 9.2 09/28/2015 1449   ALKPHOS 60 09/28/2015 1449   AST 24 09/28/2015 1449   ALT <9 09/28/2015 1449   BILITOT 0.46 09/28/2015 1449      No results found for: LABCA2  No components found for: LABCA125  No results for input(s): INR in the last 168 hours.  Urinalysis No results found for: COLORURINE, APPEARANCEUR, LABSPEC, PHURINE, GLUCOSEU, HGBUR, BILIRUBINUR, KETONESUR, PROTEINUR, UROBILINOGEN, NITRITE, LEUKOCYTESUR   ELIGIBLE FOR AVAILABLE RESEARCH PROTOCOL: Alliance  STUDIES: No results found.  ASSESSMENT: 50 y.o. [facility] woman status post left breast biopsy 07/10/2015 of 2 separate masses as well as left axillary lymph node, all positive for invasive lobular carcinoma, grade 1, estrogen and progesterone receptor positive, HER-2 not amplified, with an MIB-1 between 5 and 20%  (1) neoadjuvan chemotherapy to consist of cyclophosphamide and docetaxel every 3 weeks 4  (2) definitive surgery to follow, with consideration of the Alliance trial  (3) adjuvant radiation to follow surgery  (4) anti-estrogens to start at the completion of local therapy  PLAN: Monserratt looks and feels well today. The labs were reviewed in detail and were stable. She will proceed with her 4th and final cycle of cyclophosphamide and docetaxel as planned today.  Jeanie will return in 1 week for labs and a nadir visit. Prior to this visit she will have a repeat breast MRI. She understands and agrees with this plan. She knows the goal of treatment in her case is control. She has been encouraged to call with any issues that might arise before her next visit here.  Sheffield Slider, NP   10/12/2015 12:08 PM

## 2015-10-12 NOTE — Patient Instructions (Signed)
[facility] Discharge Instructions for Patients Receiving Chemotherapy  Today you received the following chemotherapy agents;  Taxotere and Cytoxan.    To help prevent nausea and vomiting after your treatment, we encourage you to take your nausea medication as directed.     If you develop nausea and vomiting that is not controlled by your nausea medication, call the clinic.   BELOW ARE SYMPTOMS THAT SHOULD BE REPORTED IMMEDIATELY:  *FEVER GREATER THAN 100.5 F  *CHILLS WITH OR WITHOUT FEVER  NAUSEA AND VOMITING THAT IS NOT CONTROLLED WITH YOUR NAUSEA MEDICATION  *UNUSUAL SHORTNESS OF BREATH  *UNUSUAL BRUISING OR BLEEDING  TENDERNESS IN MOUTH AND THROAT WITH OR WITHOUT PRESENCE OF ULCERS  *URINARY PROBLEMS  *BOWEL PROBLEMS  UNUSUAL RASH Items with * indicate a potential emergency and should be followed up as soon as possible.  Feel free to call the clinic you have any questions or concerns. The clinic phone number is [phone].  Please show the CHEMO ALERT CARD at check-in to the Emergency Department and triage nurse.   

## 2015-10-12 NOTE — Telephone Encounter (Signed)
appt made and avs to be printed in treatment room. Dr Lucia Gaskins appt 7/13 at 345 pm at [place]

## 2015-10-19 ENCOUNTER — Encounter: Payer: Self-pay | Admitting: Nurse Practitioner

## 2015-10-19 ENCOUNTER — Ambulatory Visit
Admission: RE | Admit: 2015-10-19 | Discharge: 2015-10-19 | Disposition: A | Discharge status: Home / Self Care | Payer: BC Managed Care – PPO | Source: Ambulatory Visit | Attending: Nurse Practitioner | Admitting: Nurse Practitioner

## 2015-10-19 ENCOUNTER — Other Ambulatory Visit (HOSPITAL_BASED_OUTPATIENT_CLINIC_OR_DEPARTMENT_OTHER): Payer: BC Managed Care – PPO

## 2015-10-19 ENCOUNTER — Ambulatory Visit (HOSPITAL_BASED_OUTPATIENT_CLINIC_OR_DEPARTMENT_OTHER): Payer: BC Managed Care – PPO | Admitting: Nurse Practitioner

## 2015-10-19 VITALS — BP 116/69 | HR 90 | Temp 99.3°F | Resp 18 | Ht 66.0 in | Wt 206.3 lb

## 2015-10-19 DIAGNOSIS — C773 Secondary and unspecified malignant neoplasm of axilla and upper limb lymph nodes: Secondary | ICD-10-CM | POA: Diagnosis not present

## 2015-10-19 DIAGNOSIS — C50412 Malignant neoplasm of upper-outer quadrant of left female breast: Secondary | ICD-10-CM

## 2015-10-19 LAB — COMPREHENSIVE METABOLIC PANEL
ALBUMIN: 3.5 g/dL (ref 3.5–5.0)
ALK PHOS: 62 U/L (ref 40–150)
ALT: 10 U/L (ref 0–55)
ANION GAP: 6 meq/L (ref 3–11)
AST: 26 U/L (ref 5–34)
BILIRUBIN TOTAL: 0.52 mg/dL (ref 0.20–1.20)
BUN: 9.1 mg/dL (ref 7.0–26.0)
CALCIUM: 9.2 mg/dL (ref 8.4–10.4)
CO2: 25 mEq/L (ref 22–29)
Chloride: 108 mEq/L (ref 98–109)
Creatinine: 1 mg/dL (ref 0.6–1.1)
EGFR: 74 mL/min/{1.73_m2} — AB (ref >90)
Glucose: 92 mg/dl (ref 70–140)
POTASSIUM: 4.2 meq/L (ref 3.5–5.1)
Sodium: 139 mEq/L (ref 136–145)
Total Protein: 6.6 g/dL (ref 6.4–8.3)

## 2015-10-19 LAB — CBC WITH DIFFERENTIAL/PLATELET
BASO%: 0.7 % (ref 0.0–2.0)
BASOS ABS: 0.1 10*3/uL (ref 0.0–0.1)
EOS ABS: 0 10*3/uL (ref 0.0–0.5)
EOS%: 0.3 % (ref 0.0–7.0)
HEMATOCRIT: 31.6 % — AB (ref 34.8–46.6)
HEMOGLOBIN: 10 g/dL — AB (ref 11.6–15.9)
LYMPH#: 2.7 10*3/uL (ref 0.9–3.3)
LYMPH%: 20.3 % (ref 14.0–49.7)
MCH: 24.5 pg — AB (ref 25.1–34.0)
MCHC: 31.6 g/dL (ref 31.5–36.0)
MCV: 77.4 fL — AB (ref 79.5–101.0)
MONO#: 2.4 10*3/uL — AB (ref 0.1–0.9)
MONO%: 18 % — ABNORMAL HIGH (ref 0.0–14.0)
NEUT#: 8.2 10*3/uL — ABNORMAL HIGH (ref 1.5–6.5)
NEUT%: 60.7 % (ref 38.4–76.8)
PLATELETS: 221 10*3/uL (ref 145–400)
RBC: 4.08 10*6/uL (ref 3.70–5.45)
RDW: 21.4 % — ABNORMAL HIGH (ref 11.2–14.5)
WBC: 13.5 10*3/uL — ABNORMAL HIGH (ref 3.9–10.3)
nRBC: 4 % — ABNORMAL HIGH (ref 0–0)

## 2015-10-19 MED ORDER — GADOBENATE DIMEGLUMINE 529 MG/ML IV SOLN
19.0000 mL | Freq: Once | INTRAVENOUS | Status: AC | PRN
  Administered 2015-10-19: 19 mL via INTRAVENOUS

## 2015-10-19 NOTE — Progress Notes (Signed)
[facility]  Telephone:(336) [phone] [phone]   ID: Nicole Cella DOB: 19-May-1965  MR#: [id number]  JYN#:[id number]  Patient Care Team: [name], MD as PCP - General (Family Medicine) [name], MD as Consulting Physician (General Surgery) Lowella Dell, MD as Consulting Physician (Oncology) Antony Blackbird, MD as Consulting Physician (Radiation Oncology) Salomon Fick, NP as Nurse Practitioner (Hematology and Oncology) Levi Aland, MD as Consulting Physician (Obstetrics and Gynecology) PCP: [name], MD OTHER MD:  CHIEF COMPLAINT: Estrogen receptor positive breast cancer  CURRENT TREATMENT: Neoadjuvant chemotherapy  BREAST CANCER HISTORY: Sylvette had routine screening mammography at Dr. Ewell Poe office/[facility] 06/23/2015. There was an area of distortion in the left breast. The patient was then referred to the Breast Center where on 07/03/2015 he underwent left diagnostic mammography with tomosynthesis and left breast ultrasonography. The breast density was category B. In the far outer left breast there was an area of distortion measuring approximately 4 cm. There appeared to be a prominent left axillary lymph node. On physical exam at the 2:00 position of the left breast 12 cm from the nipple there was an area of approximately 4 cm of firmness. There was no palpable mass in the left axilla. Ultrasonography confirmed an irregular hypoechoic mass in the upper-outer quadrant of the left breast measuring 2.6 cm. A little closer to the axilla there was a second irregular hypoechoic mass measuring 0.5 cm. The distance between these 2 masses was 3.6 cm. In addition there was a right axillary lymph node with a thickened cortex. It measured 0.9 cm.  On 5 or 20 11/30/2015 the patient underwent biopsy of the 2 areas in the breast as well as the axillary lymph node. The larger of the 2 masses was an invasive lobular carcinoma,  E-cadherin negative, estrogen receptor 100% positive, progesterone receptor 100% positive, both with strong staining intensity, with an MIB-1 of 20%. This second, smaller mass, was also an invasive lobular carcinoma, E-cadherin negative, estrogen receptor 90% positive, progesterone receptor 100% positive, both with strong staining intensity, with an MIB-1 of 5%. The right axillary lymph node was also an invasive mammary carcinoma. And also estrogen receptor positive at 70%, progesterone receptor positive at 90%, with an MIB-1 of 10%.  Her subsequent history is as detailed below  INTERVAL HISTORY: Makhya returns for follow up of her breast cancer, accompanied by her husband. Today is day 8, cycle 4 of docetaxel and cyclophosphamide, with neulasta onpro for granulocyte support. She had her final breast MRI earlier today and is here to review the results.  REVIEW OF SYSTEMS: Rebekah tolerated her last treatment well. She is slightly fatigued, but had no other issues. She has less bone pain this week. The neuropathy to her toes has resolved but she still has some numbness at the ball of one of her feet. A detailed review of systems is otherwise stable.   PAST MEDICAL HISTORY: Past Medical History  Diagnosis Date  . Hypertension   . Breast cancer of upper-outer quadrant of left female breast (HCC) 07/13/2015    PAST SURGICAL HISTORY: Past Surgical History  Procedure Laterality Date  . Cesarean section  1998  . Foot surgery  1993    bunionectomy  . Dilation and evacuation  05/01/2011    Procedure: DILATATION AND EVACUATION;  Surgeon: Levi Aland;  Location: [facility];  Service: Gynecology;  Laterality: N/A;  . Portacath placement Right 07/31/2015    Procedure: INSERTION PORT-A-CATH;  Surgeon: [name], MD;  Location: [facility];  Service: General;  Laterality: Right;    FAMILY HISTORY Family History  Problem Relation Age of Onset  . Colon cancer Mother   The patient's  father died at age 15 from "complicated causes". The patient's mother died from colon cancer at the age of 2. He cancer was diagnosed the year prior. The patient has 3 brothers, 11 sisters. There is no history of breast or ovarian cancer and no other colon cancers in the family  GYNECOLOGIC HISTORY:  No LMP recorded. Menarche age 76, first live birth age 1, which the patient understands increases the risk of breast cancer. She is GX P2. She still having regular periods as of March 2017. She used birth control remotely with no complications.  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 Her husband [name] works for a Midwife is Chiropodist of environmental services. [name] has a child from a prior marriage, Midge Ringen, who is in the [employer]. Lillyrose as a child from a prior marriage, Shepard [employer], who is going to school in [place]. The couple have added son, [name], 106 years old, at home. The patient has one grandchild. She goes to a local [employer].    ADVANCED DIRECTIVES: no   HEALTH MAINTENANCE: Social History  Substance Use Topics  . Smoking status: Never Smoker   . Smokeless tobacco: Not on file  . Alcohol Use: Yes     Comment: social     Colonoscopy: January 2016/[facility]  PAP: February 2017  Bone density: Never  Lipid panel:  No Known Allergies  Current Outpatient Prescriptions  Medication Sig Dispense Refill  . amLODipine (NORVASC) 5 MG tablet Take 5 mg by mouth every morning. Reported on 08/28/2015    . lidocaine-prilocaine (EMLA) cream Apply to affected area once (Patient not taking: Reported on 10/19/2015) 30 g 3  . LORazepam (ATIVAN) 0.5 MG tablet Take 1 tablet (0.5 mg total) by mouth at bedtime as needed (Nausea or vomiting). (Patient not taking: Reported on 09/28/2015) 30 tablet 0  . ondansetron (ZOFRAN) 8 MG tablet Take 1 tablet (8 mg total) by mouth 2 (two) times  daily as needed for refractory nausea / vomiting. Start on day 3 after chemo. (Patient not taking: Reported on 09/28/2015) 30 tablet 1  . prochlorperazine (COMPAZINE) 10 MG tablet Take 1 tablet (10 mg total) by mouth every 6 (six) hours as needed (Nausea or vomiting). (Patient not taking: Reported on 08/15/2015) 30 tablet 1   No current facility-administered medications for this visit.    OBJECTIVE: Middle-aged African-American woman in no acute distress Filed Vitals:   10/19/15 1458  BP: 116/69  Pulse: 90  Temp: 99.3 F (37.4 C)  Resp: 18     Body mass index is 33.31 kg/(m^2).    ECOG FS:0 - Asymptomatic  Skin: warm, dry  HEENT: sclerae anicteric, conjunctivae pink, oropharynx clear. No thrush or mucositis.  Lymph Nodes: No cervical or supraclavicular lymphadenopathy  Lungs: clear to auscultation bilaterally, no rales, wheezes, or rhonci  Heart: regular rate and rhythm  Abdomen: round, soft, non tender, positive bowel sounds  Musculoskeletal: No focal spinal tenderness, no peripheral edema  Neuro: non focal, well oriented, positive affect  Breasts: deferred  LAB RESULTS:  CMP     Component Value Date/Time   NA 139 10/19/2015 1451   K 4.2 10/19/2015 1451   CO2 25 10/19/2015 1451   GLUCOSE 92 10/19/2015  1451   BUN 9.1 10/19/2015 1451   CREATININE 1.0 10/19/2015 1451   CALCIUM 9.2 10/19/2015 1451   PROT 6.6 10/19/2015 1451   ALBUMIN 3.5 10/19/2015 1451   AST 26 10/19/2015 1451   ALT 10 10/19/2015 1451   ALKPHOS 62 10/19/2015 1451   BILITOT 0.52 10/19/2015 1451    INo results found for: SPEP, UPEP  Lab Results  Component Value Date   WBC 13.5* 10/19/2015   NEUTROABS 8.2* 10/19/2015   HGB 10.0* 10/19/2015   HCT 31.6* 10/19/2015   MCV 77.4* 10/19/2015   PLT 221 10/19/2015      Chemistry      Component Value Date/Time   NA 139 10/19/2015 1451   K 4.2 10/19/2015 1451   CO2 25 10/19/2015 1451   BUN 9.1 10/19/2015 1451   CREATININE 1.0 10/19/2015 1451        Component Value Date/Time   CALCIUM 9.2 10/19/2015 1451   ALKPHOS 62 10/19/2015 1451   AST 26 10/19/2015 1451   ALT 10 10/19/2015 1451   BILITOT 0.52 10/19/2015 1451      No results found for: LABCA2  No components found for: LABCA125  No results for input(s): INR in the last 168 hours.  Urinalysis No results found for: COLORURINE, APPEARANCEUR, LABSPEC, PHURINE, GLUCOSEU, HGBUR, BILIRUBINUR, KETONESUR, PROTEINUR, UROBILINOGEN, NITRITE, LEUKOCYTESUR   ELIGIBLE FOR AVAILABLE RESEARCH PROTOCOL: Alliance  STUDIES: Mr Breast Bilateral W Wo Contrast  10/19/2015  CLINICAL DATA:  50 year old female - evaluate known left breast invasive lobular carcinoma and left axillary lymph node metastasis following neoadjuvant therapy. LABS:  None performed today EXAM: BILATERAL BREAST MRI WITH AND WITHOUT CONTRAST TECHNIQUE: Multiplanar, multisequence MR images of both breasts were obtained prior to and following the intravenous administration of 19 ml of MultiHance. THREE-DIMENSIONAL MR IMAGE RENDERING ON INDEPENDENT WORKSTATION: Three-dimensional MR images were rendered by post-processing of the original MR data on an independent workstation. The three-dimensional MR images were interpreted, and findings are reported in the following complete MRI report for this study. Three dimensional images were evaluated at the independent DynaCad workstation COMPARISON:  07/21/2015 MR. 07/21/2015 and prior mammograms. FINDINGS: Breast composition: b. Scattered fibroglandular tissue. Background parenchymal enhancement: Mild Right breast: No mass or abnormal enhancement. Left breast: Posterior upper outer left breast mass and non masslike enhancement has significantly decreased in volume and solid components. This enhancement now measures 5 x 3 x 3 cm, previously measuring 6.7 x 3.9 x 4.3 cm. Biopsy clip artifacts are again noted along the anterior aspect of this enhancement. Lymph nodes: A lymph node with focal cortical  thickening within the left axilla has decreased in size, compatible with biopsy-proven metastasis. A stable 1.2 cm right axillary lymph node is again identified. Ancillary findings: A Port-A-Cath within the medial right breast is present. IMPRESSION: Treatment response of biopsy-proven malignancy within the upper outer left breast and left axillary metastasis as described. Unchanged prominent right axillary lymph node, likely reactive. No new suspicious findings identified. RECOMMENDATION: Treatment plan BI-RADS CATEGORY  6: Known biopsy-proven malignancy. Electronically Signed   By: Harmon Pier M.D.   On: 10/19/2015 11:15    ASSESSMENT: 50 y.o. [facility] woman status post left breast biopsy 07/10/2015 of 2 separate masses as well as left axillary lymph node, all positive for invasive lobular carcinoma, grade 1, estrogen and progesterone receptor positive, HER-2 not amplified, with an MIB-1 between 5 and 20%  (1) neoadjuvan chemotherapy to consist of cyclophosphamide and docetaxel every 3 weeks 4  (2)  definitive surgery to follow, with consideration of the Alliance trial  (3) adjuvant radiation to follow surgery  (4) anti-estrogens to start at the completion of local therapy  PLAN: Nzuri is excited to have completed chemotherapy. We reviewed the results of her breast MRI, which showed a positive response to treatment, shrinking the tumor by a little more than a cm. She can still feel the mass of course, but it is noticeably smaller and softer. She meets with Dr. Ezzard Standing on 6/20 to discuss and schedule surgery.  Valley will return at the end of July for follow up with Dr. Darnelle Catalan. She understands and agrees with this plan. She knows the goal of treatment in her case is control. She has been encouraged to call with any issues that might arise before her next visit here.  Sheffield Slider, NP   10/19/2015 3:50 PM

## 2015-10-24 ENCOUNTER — Encounter: Payer: Self-pay | Admitting: Oncology

## 2015-10-24 NOTE — Progress Notes (Signed)
Pt is approved for the $1000 [employer] and will become active once she starts radiation since she's done w/ chemo.

## 2015-10-26 ENCOUNTER — Other Ambulatory Visit: Payer: Self-pay | Admitting: Surgery

## 2015-10-26 DIAGNOSIS — C50912 Malignant neoplasm of unspecified site of left female breast: Secondary | ICD-10-CM

## 2015-11-10 ENCOUNTER — Other Ambulatory Visit: Payer: Self-pay | Admitting: Surgery

## 2015-11-10 DIAGNOSIS — C50912 Malignant neoplasm of unspecified site of left female breast: Secondary | ICD-10-CM

## 2015-11-15 ENCOUNTER — Other Ambulatory Visit: Payer: Self-pay | Admitting: Oncology

## 2015-11-21 ENCOUNTER — Other Ambulatory Visit: Payer: Self-pay | Admitting: Surgery

## 2015-11-21 DIAGNOSIS — C50912 Malignant neoplasm of unspecified site of left female breast: Secondary | ICD-10-CM

## 2015-11-24 ENCOUNTER — Encounter (HOSPITAL_COMMUNITY): Payer: Self-pay

## 2015-11-24 ENCOUNTER — Telehealth: Payer: Self-pay | Admitting: *Deleted

## 2015-11-24 ENCOUNTER — Encounter (HOSPITAL_COMMUNITY)
Admission: RE | Admit: 2015-11-24 | Discharge: 2015-11-24 | Disposition: A | Discharge status: Home / Self Care | Payer: BC Managed Care – PPO | Source: Ambulatory Visit | Attending: Surgery | Admitting: Surgery

## 2015-11-24 DIAGNOSIS — Z01812 Encounter for preprocedural laboratory examination: Secondary | ICD-10-CM | POA: Insufficient documentation

## 2015-11-24 HISTORY — DX: Cardiac murmur, unspecified: R01.1

## 2015-11-24 LAB — CBC
HCT: 35.8 % — ABNORMAL LOW (ref 36.0–46.0)
HEMOGLOBIN: 11.1 g/dL — AB (ref 12.0–15.0)
MCH: 25.5 pg — AB (ref 26.0–34.0)
MCHC: 31 g/dL (ref 30.0–36.0)
MCV: 82.1 fL (ref 78.0–100.0)
PLATELETS: 255 10*3/uL (ref 150–400)
RBC: 4.36 MIL/uL (ref 3.87–5.11)
RDW: 18.4 % — ABNORMAL HIGH (ref 11.5–15.5)
WBC: 7.3 10*3/uL (ref 4.0–10.5)

## 2015-11-24 LAB — BASIC METABOLIC PANEL
ANION GAP: 7 (ref 5–15)
BUN: 13 mg/dL (ref 6–20)
CO2: 25 mmol/L (ref 22–32)
Calcium: 9.6 mg/dL (ref 8.9–10.3)
Chloride: 109 mmol/L (ref 101–111)
Creatinine, Ser: 0.88 mg/dL (ref 0.44–1.00)
Glucose, Bld: 92 mg/dL (ref 65–99)
POTASSIUM: 3.9 mmol/L (ref 3.5–5.1)
SODIUM: 141 mmol/L (ref 135–145)

## 2015-11-24 LAB — HCG, SERUM, QUALITATIVE: PREG SERUM: POSITIVE — AB

## 2015-11-24 NOTE — Progress Notes (Signed)
Serum pregnancy test - weak postive, I called result to Abigail Butts at [facility].

## 2015-11-24 NOTE — Telephone Encounter (Signed)
  Oncology Nurse Navigator Documentation  Navigator Location: [facility] (11/24/15 1500) Navigator Encounter Type: Telephone (11/24/15 1500) Telephone: Lahoma Crocker Call;Appt Confirmation/Clarification (11/24/15 1500)                                        Time Spent with Patient: 15 (11/24/15 1500)   Spoke with patient and she needs to cancel her appointment with Dr. Jana Hakim for 7/20 because that is when she is having her seed placed.  Informed her we could just reschedule her appointment after she has finished XRT.  She is ok with this.

## 2015-11-24 NOTE — Pre-Procedure Instructions (Signed)
    [facility]  11/24/2015     Your procedure is scheduled on Friday, July 21.  Report to [facility] Admitting at 7:30 A.M.               Your surgery or procedure is scheduled for 9:30 AM   Call this number if you have problems the morning of surgery:[phone]                For any other questions, please call [phone], Monday - Friday 8 AM - 4 PM.  Remember:  Do not eat food or drink liquids after midnight Thursday, July 20.  Take these medicines the morning of surgery with A SIP OF WATER :amLODipine (NORVASC).   Do not wear jewelry, make-up or nail polish.  Do not wear lotions, powders, or perfumes.    Do not shave 48 hours prior to surgery.    Do not bring valuables to the hospital.  [facility] is not responsible for any belongings or valuables.  Contacts, dentures or bridgework may not be worn into surgery.  Leave your suitcase in the car.  After surgery it may be brought to your room.  For patients admitted to the hospital, discharge time will be determined by your treatment team.  Patients discharged the day of surgery will not be allowed to drive home.   Name and phone number of your driver:   -  Special instructions:Review  [facility] - Preparing For Surgery.   Please read over the following fact sheets that you were given; Review  [facility] - Preparing For Surgery.

## 2015-11-27 ENCOUNTER — Other Ambulatory Visit: Payer: Self-pay | Admitting: Surgery

## 2015-11-28 NOTE — Progress Notes (Signed)
Anesthesia Chart Review: Patient is a 50 year old married female scheduled for left breast seed guided lumpectomy with left axillary LN dissection and Port-a-cath removal on 12/01/15 by Dr. Alphonsa Overall.  History includes left breast cancer, Port-a-cath insertion 07/31/15, HTN, never smoker, murmur (was told "nothing to worry about"). PCP is Dr. Billey Chang.  Meds include amlodipine, 65 Fe.  07/31/15 EKG: NSR with sinus arrhythmia, possible LAE.  07/31/15 1V CXR: IMPRESSION: Right chest subclavian approach porta cath placed with no adverse features.  07/27/15 CT chest/abd/pelvis: IMPRESSION: 1. Left breast primary with suspicious left axillary node, as on prior MRI. 2. No evidence of distant metastatic disease.  Preoperative labs noted. BMET WNL. H/H 11.1/35.8. Serum pregnancy test was weakly positive (done at PAT due to reports of intermittent periods since undergoing chemotherapy, LMP ~ 08/2015. She and her husband use condoms for birth control). She lives in [place], [place] so she was sent to [facility] today for a quantitative serum pregnancy test which was 2.32 (0-50 is consistent with 0-1 week). First pregnancy test was 4 days ago, so if she was in fact pregnant then would expect quantitative results to be much higher. Last menses ~ 3 months ago, but only having sporadically. Discussed HCG results with anesthesiologist Dr. Linna Caprice who also spoke with hospital covering OB-GYN Dr. Ihor Dow. She felt results were not consistent with pregnancy. Since test was done > 72 hours later, results should have been at least 20 to be consistent with active pregnancy. I have notified patient and will forward to Dr. Lucia Gaskins.  George Hugh [facility] Stay Center/Anesthesiology Phone [phone] 11/28/2015 6:08 PM

## 2015-11-30 ENCOUNTER — Ambulatory Visit
Admission: RE | Admit: 2015-11-30 | Discharge: 2015-11-30 | Disposition: A | Discharge status: Home / Self Care | Payer: BC Managed Care – PPO | Source: Ambulatory Visit | Attending: Surgery | Admitting: Surgery

## 2015-11-30 ENCOUNTER — Other Ambulatory Visit: Payer: BC Managed Care – PPO

## 2015-11-30 ENCOUNTER — Ambulatory Visit: Payer: BC Managed Care – PPO | Admitting: Oncology

## 2015-11-30 DIAGNOSIS — C50912 Malignant neoplasm of unspecified site of left female breast: Secondary | ICD-10-CM

## 2015-11-30 NOTE — H&P (Signed)
Emma [name]  Location: [facility] Patient #: [id number] DOB: October 12, 1965 Married / Language: English / Race: Black or African American Female  History of Present Illness  The patient is a 50 year old female who presents with breast cancer.  Her PCP is Dr. Malva [name]  She is at the [facility] - Drs. Magrinat/Kinard.  She comes with her husband, Emma [name].   She had her last chemotx on 10/12/2015.  She had a follow up MRI on 10/19/2015 - which showed the left breast mass decreased from 6.7 x 3.9 x 4.3 cm to 5 x 3 x 3 cm (a 60% decrease in volume).  She was supposed to see Dr. Leta [name] today for discussion of plastic surgery options, but skipped the appt. She really wants to try a lumpectomy. She has enough breast tissue that I think I can get around the tumor and leave her with a reasonable cosmetic result. If the tumor is much larger than what is seen on MRI - she'll need either a re-excision of the lumpectomy or more probably a mastectomy. She will also need a left axillary node dissection  History of breast cancer (07/2015): Emma [name] thought she felt something in her left breast. Her last mammogram was about 1 year ago. She has no family history of breast cancer. She is not on hormone medication. She lives in [place], [place], but has returned to [place] for her medical care. For now she will get her treatment in [place], though she is considering radiation therapy closer to [place].  [facility] which showed - 1. Persistent area of architectural distortion and mass in the 2:30 position left breast approximately 12 cm from the nipple. On mammography the abnormality measures up to 4 cm, and on ultrasound it measures 2.6 cm. Findings are suspicious for malignancy. 2. Separate 5 mm suspicious hypoechoic mass at 2:30 position approximately 12-14 cm from the  nipple. 3. Cortical thickening of the left axillary lymph node. This could be reactive, or could reflect metastatic involvement.  Biopsies of the left breast on 07/10/2015 [phone]) - showed ILC at the 2 o'clock and 2:30 o'clock position, lymph node positive, ER/PR postive, Her2Neu - neg, Ki67 - 5 - 20%  Plan: 1) Left breast lumpectomy (seed loc) and left axillary axillary dissection, 2) probably remove her port (will check with Dr. Darnelle Catalan), 3) she is going to get her rad tx near [place], so this makes her ineligible for the Alliance trial, 4) she did not see Dr. Leta [name] She understands because of the size of the cancer, the risk of positive margins are higher, which could require a second lumpectomy or mastectomy. The risks of surgery include, but are not limited to, bleeding, infection, the need for further surgery, lymphedema, and nerve injury.  Addendum Note Emma [name]. Emma Standing MD; 10/26/2015 6:50 PM) I talked to [name]. I discussed lumpectomy vs mastectomy. She will need a left axillary dissection anyway. She will probably need irradiation anyway. I will set her up to see plastic surgery for possible reconstruction (or for help post lumpectomy). And I will go ahead and schedule her for a left breast lumpectomy (with seed) and left axillary node dissection after I see her on 6/29.  Addendum Note Emma [name]. Emma Standing MD; 10/27/2015 2:21 PM) Dr. Ezzard [name], You are scheduled to see this pt soon. She has completed her neoadjuvant chemo and is ready for surgery. She is not eligible for the Alliance [id number] study because  she wants to receive her radiation closer to her home in [place], [place], not at a [facility] radiation facility. I informed the pt at her last visit that this was an issue. I just wanted to let you know the reason why she will not be enrolled on the study.  Thanks, Emma [name] Clinical Research  [name]  Past Medical History: 1. HTN x 15 years 2. Colonoscopy at [facility] in 2015 (unsure of physician) (her mother had colon ca) 3. 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  Allergies (Emma [name], CMA; 11/09/2015 4:45 PM) No Known Drug Allergies04/11/2015  Medication History (Emma [name], CMA; 11/09/2015 4:46 PM) AmLODIPine Besylate (10MG  Tablet, Oral) Active. LORazepam (0.5MG  Tablet, Oral as needed) Active. Ondansetron HCl (8MG  Tablet, Oral) Active. Prochlorperazine Maleate (10MG  Tablet, Oral) Active. Medications Reconciled  Vitals (Emma [name] CMA; 11/09/2015 4:45 PM) 11/09/2015 4:45 PM Weight: 206 lb Height: 65in Body Surface Area: 2 m Body Mass Index: 34.28 kg/m  Temp.: 26F(Temporal)  Pulse: 79 (Regular)  BP: 126/80 (Sitting, Left Arm, Standard)   Physical Exam  General: Middle aged AA Falert and generally healthy appearing. HEENT: Normal. Pupils equal.  Neck: Supple. No mass. No thyroid mass.  Lymph Nodes: No supraclavicular or cervical nodes. I feel no mass in her left axilla.  Lungs: Clear to auscultation and symmetric breath sounds. Heart: RRR. No murmur or rub.  Breasts: Right - No mass. Port in the upper inner right breast. Left - When I first saw her, I could feel a fullness in the left breast at 3 o'clock. But now, I feel no specific mass or nodule.  Abdomen: Soft. No mass. No tenderness. No hernia. Normal bowel sounds. No abdominal scars.  Extremities: Good strength and ROM  in upper and lower extremities.   Assessment & Plan  1.  BREAST CANCER, STAGE 2, LEFT (C50.912)  Story: Biopsies of the Left breast on 07/10/2015 ([id number]) - showed ILC at the 2 o'clock and 2:30 o'clock position (2 foci), lymph node positive, ER/PR postive, Her2Neu - neg, Ki67 - 5 - 20%   Oncology - Drs. Magrinat and Kinard  Plan:   1) Comopleted neo-adjuvant chemotherapy   2) Plan: left breast lumpectomy (seed loc), left axillary node dissection, and probable power port removal.    She understands because of the size of the cancer, the risk of positive margins are higher, which could require a second lumpectomy or mastectomy  Addendum Note(Seldon Barrell H. Emma Standing MD; 11/16/2015 1:11 PM)   From Dr. Darnelle Catalan:    OK to remove port. If she needs chemo post op we'll use oral capecitabine 2. HTN x 15 years 3. Right power port placed on 07/31/2015 by D. Melecio Cueto  4.  Pregnancy thing  Addendum Note(Kevaughn Ewing H. Emma Standing MD; 11/27/2015 6:25 PM) Pregnancy test came back equivocal. I spoke to Sharalyn Ink at [facility] pre-sx [phone]) I spoke to Rica Mast, [phone], who will take care of follow up. I spoke to [name], who is going in the AM to get further blood testing.  Addendum Note(Leovardo Thoman H. Emma Standing MD; 11/29/2015 11:39 AM) Questions came up on her pregnancy test. A quantitative test was 2.32 on 11/28/2015. (On 11/24/2015 - it was "weak positive")  I talked to Dr. Chestine Spore, Dr. Ewell Poe partner, about the results. It is most likely a false negative. Will proceed with surgery. I went over this with the patient and spoke to [place] at Anesthesia pre op.    [name], MD, [facility] Surgery Pager: [phone] Office phone:  [phone]

## 2015-12-01 ENCOUNTER — Encounter (HOSPITAL_COMMUNITY)
Admission: RE | Disposition: A | Discharge status: Home / Self Care | Payer: Self-pay | Source: Ambulatory Visit | Attending: Surgery

## 2015-12-01 ENCOUNTER — Observation Stay (HOSPITAL_COMMUNITY)
Admission: RE | Admit: 2015-12-01 | Discharge: 2015-12-02 | Disposition: A | Discharge status: Home / Self Care | Payer: BC Managed Care – PPO | Source: Ambulatory Visit | Attending: Surgery | Admitting: Surgery

## 2015-12-01 ENCOUNTER — Ambulatory Visit (HOSPITAL_COMMUNITY): Payer: BC Managed Care – PPO | Admitting: Vascular Surgery

## 2015-12-01 ENCOUNTER — Encounter (HOSPITAL_COMMUNITY): Payer: Self-pay | Admitting: Certified Registered Nurse Anesthetist

## 2015-12-01 ENCOUNTER — Ambulatory Visit
Admission: RE | Admit: 2015-12-01 | Discharge: 2015-12-01 | Disposition: A | Discharge status: Home / Self Care | Payer: BC Managed Care – PPO | Source: Ambulatory Visit | Attending: Surgery | Admitting: Surgery

## 2015-12-01 DIAGNOSIS — D0502 Lobular carcinoma in situ of left breast: Secondary | ICD-10-CM | POA: Diagnosis present

## 2015-12-01 DIAGNOSIS — C50912 Malignant neoplasm of unspecified site of left female breast: Principal | ICD-10-CM | POA: Insufficient documentation

## 2015-12-01 DIAGNOSIS — I10 Essential (primary) hypertension: Secondary | ICD-10-CM | POA: Insufficient documentation

## 2015-12-01 DIAGNOSIS — C773 Secondary and unspecified malignant neoplasm of axilla and upper limb lymph nodes: Secondary | ICD-10-CM | POA: Diagnosis not present

## 2015-12-01 DIAGNOSIS — Z79899 Other long term (current) drug therapy: Secondary | ICD-10-CM | POA: Diagnosis not present

## 2015-12-01 DIAGNOSIS — Z170 Estrogen receptor positive status [ER+]: Secondary | ICD-10-CM | POA: Diagnosis not present

## 2015-12-01 HISTORY — PX: RADIOACTIVE SEED GUIDED PARTIAL MASTECTOMY/AXILLARY SENTINEL NODE BIOPSY/AXILLARY NODE DISSECTION: SHX6491

## 2015-12-01 HISTORY — PX: PORT-A-CATH REMOVAL: SHX5289

## 2015-12-01 HISTORY — PX: BREAST LUMPECTOMY: SHX2

## 2015-12-01 SURGERY — RADIOACTIVE SEED GUIDED PARTIAL MASTECTOMY WITH AXILLARY SENTINEL LYMPH NODE BIOPSY AND AXILLARY LYMPH NODE DISSECTION
Anesthesia: Regional | Site: Chest | Laterality: Right

## 2015-12-01 MED ORDER — ACETAMINOPHEN 500 MG PO TABS
1000.0000 mg | ORAL_TABLET | ORAL | Status: AC
  Administered 2015-12-01: 1000 mg via ORAL
  Filled 2015-12-01: qty 2

## 2015-12-01 MED ORDER — ONDANSETRON 4 MG PO TBDP: 4.0000 mg | ORAL_TABLET | Freq: Four times a day (QID) | ORAL | Status: DC | PRN

## 2015-12-01 MED ORDER — PHENYLEPHRINE HCL 10 MG/ML IJ SOLN
10.0000 mg | INTRAVENOUS | Status: DC | PRN
  Administered 2015-12-01: 25 ug/min via INTRAVENOUS

## 2015-12-01 MED ORDER — MIDAZOLAM HCL 2 MG/2ML IJ SOLN
INTRAMUSCULAR | Status: AC
  Filled 2015-12-01: qty 2

## 2015-12-01 MED ORDER — METHYLENE BLUE 0.5 % INJ SOLN
INTRAVENOUS | Status: AC
  Filled 2015-12-01: qty 10

## 2015-12-01 MED ORDER — ONDANSETRON HCL 4 MG/2ML IJ SOLN
INTRAMUSCULAR | Status: AC
  Filled 2015-12-01: qty 2

## 2015-12-01 MED ORDER — ONDANSETRON HCL 4 MG/2ML IJ SOLN
INTRAMUSCULAR | Status: DC | PRN
  Administered 2015-12-01: 4 mg via INTRAVENOUS

## 2015-12-01 MED ORDER — FENTANYL CITRATE (PF) 250 MCG/5ML IJ SOLN
INTRAMUSCULAR | Status: AC
  Filled 2015-12-01: qty 5

## 2015-12-01 MED ORDER — LIDOCAINE HCL (CARDIAC) 20 MG/ML IV SOLN
INTRAVENOUS | Status: DC | PRN
  Administered 2015-12-01: 100 mg via INTRAVENOUS

## 2015-12-01 MED ORDER — PROPOFOL 10 MG/ML IV BOLUS
INTRAVENOUS | Status: AC
  Filled 2015-12-01: qty 20

## 2015-12-01 MED ORDER — SODIUM CHLORIDE 0.9 % IJ SOLN
INTRAMUSCULAR | Status: AC
  Filled 2015-12-01: qty 10

## 2015-12-01 MED ORDER — PHENYLEPHRINE HCL 10 MG/ML IJ SOLN
INTRAMUSCULAR | Status: DC | PRN
  Administered 2015-12-01: 120 ug via INTRAVENOUS
  Administered 2015-12-01 (×2): 40 ug via INTRAVENOUS
  Administered 2015-12-01: 120 ug via INTRAVENOUS

## 2015-12-01 MED ORDER — BUPIVACAINE-EPINEPHRINE (PF) 0.5% -1:200000 IJ SOLN
INTRAMUSCULAR | Status: DC | PRN
  Administered 2015-12-01: 30 mL

## 2015-12-01 MED ORDER — LIDOCAINE-EPINEPHRINE (PF) 1 %-1:200000 IJ SOLN
INTRAMUSCULAR | Status: AC
  Filled 2015-12-01: qty 30

## 2015-12-01 MED ORDER — HYDROCODONE-ACETAMINOPHEN 5-325 MG PO TABS: 1.0000 | ORAL_TABLET | ORAL | Status: DC | PRN

## 2015-12-01 MED ORDER — BUPIVACAINE-EPINEPHRINE 0.25% -1:200000 IJ SOLN
INTRAMUSCULAR | Status: DC | PRN
  Administered 2015-12-01: 10 mL

## 2015-12-01 MED ORDER — DEXAMETHASONE SODIUM PHOSPHATE 10 MG/ML IJ SOLN
INTRAMUSCULAR | Status: AC
  Filled 2015-12-01: qty 1

## 2015-12-01 MED ORDER — DEXAMETHASONE SODIUM PHOSPHATE 10 MG/ML IJ SOLN
INTRAMUSCULAR | Status: DC | PRN
  Administered 2015-12-01: 10 mg via INTRAVENOUS

## 2015-12-01 MED ORDER — CHLORHEXIDINE GLUCONATE CLOTH 2 % EX PADS: 6.0000 | MEDICATED_PAD | Freq: Once | CUTANEOUS | Status: DC

## 2015-12-01 MED ORDER — GABAPENTIN 300 MG PO CAPS
300.0000 mg | ORAL_CAPSULE | ORAL | Status: AC
  Administered 2015-12-01: 300 mg via ORAL
  Filled 2015-12-01: qty 1

## 2015-12-01 MED ORDER — LIDOCAINE 2% (20 MG/ML) 5 ML SYRINGE
INTRAMUSCULAR | Status: AC
  Filled 2015-12-01: qty 5

## 2015-12-01 MED ORDER — ONDANSETRON HCL 4 MG/2ML IJ SOLN: 4.0000 mg | Freq: Once | INTRAMUSCULAR | Status: DC | PRN

## 2015-12-01 MED ORDER — HEPARIN SODIUM (PORCINE) 5000 UNIT/ML IJ SOLN
5000.0000 [IU] | Freq: Three times a day (TID) | INTRAMUSCULAR | Status: DC
  Administered 2015-12-01 – 2015-12-02 (×2): 5000 [IU] via SUBCUTANEOUS
  Filled 2015-12-01 (×2): qty 1

## 2015-12-01 MED ORDER — MEPERIDINE HCL 25 MG/ML IJ SOLN: 6.2500 mg | INTRAMUSCULAR | Status: DC | PRN

## 2015-12-01 MED ORDER — KCL IN DEXTROSE-NACL 20-5-0.45 MEQ/L-%-% IV SOLN
INTRAVENOUS | Status: DC
  Administered 2015-12-01 – 2015-12-02 (×2): via INTRAVENOUS
  Filled 2015-12-01 (×2): qty 1000

## 2015-12-01 MED ORDER — FENTANYL CITRATE (PF) 100 MCG/2ML IJ SOLN
INTRAMUSCULAR | Status: AC
  Administered 2015-12-01: 100 ug
  Filled 2015-12-01: qty 2

## 2015-12-01 MED ORDER — MORPHINE SULFATE (PF) 2 MG/ML IV SOLN
1.0000 mg | INTRAVENOUS | Status: DC | PRN
  Administered 2015-12-01: 2 mg via INTRAVENOUS
  Filled 2015-12-01: qty 1

## 2015-12-01 MED ORDER — AMLODIPINE BESYLATE 10 MG PO TABS
10.0000 mg | ORAL_TABLET | Freq: Every day | ORAL | Status: DC
  Administered 2015-12-02: 10 mg via ORAL
  Filled 2015-12-01 (×2): qty 1

## 2015-12-01 MED ORDER — LACTATED RINGERS IV SOLN
INTRAVENOUS | Status: DC
  Administered 2015-12-01 (×2): via INTRAVENOUS

## 2015-12-01 MED ORDER — HYDROMORPHONE HCL 1 MG/ML IJ SOLN: 0.2500 mg | INTRAMUSCULAR | Status: DC | PRN

## 2015-12-01 MED ORDER — BUPIVACAINE-EPINEPHRINE (PF) 0.25% -1:200000 IJ SOLN
INTRAMUSCULAR | Status: AC
  Filled 2015-12-01: qty 30

## 2015-12-01 MED ORDER — IBUPROFEN 600 MG PO TABS: 600.0000 mg | ORAL_TABLET | Freq: Four times a day (QID) | ORAL | Status: DC | PRN

## 2015-12-01 MED ORDER — 0.9 % SODIUM CHLORIDE (POUR BTL) OPTIME
TOPICAL | Status: DC | PRN
  Administered 2015-12-01 (×2): 1000 mL

## 2015-12-01 MED ORDER — CELECOXIB 200 MG PO CAPS
400.0000 mg | ORAL_CAPSULE | ORAL | Status: AC
  Administered 2015-12-01: 200 mg via ORAL
  Filled 2015-12-01: qty 2

## 2015-12-01 MED ORDER — ONDANSETRON HCL 4 MG/2ML IJ SOLN: 4.0000 mg | Freq: Four times a day (QID) | INTRAMUSCULAR | Status: DC | PRN

## 2015-12-01 MED ORDER — EPHEDRINE SULFATE 50 MG/ML IJ SOLN
INTRAMUSCULAR | Status: DC | PRN
  Administered 2015-12-01 (×3): 5 mg via INTRAVENOUS

## 2015-12-01 MED ORDER — MIDAZOLAM HCL 2 MG/2ML IJ SOLN
INTRAMUSCULAR | Status: AC
Start: 2015-12-01 — End: 2015-12-01
  Administered 2015-12-01: 2 mg
  Filled 2015-12-01: qty 2

## 2015-12-01 MED ORDER — PROPOFOL 10 MG/ML IV BOLUS
INTRAVENOUS | Status: DC | PRN
  Administered 2015-12-01: 200 mg via INTRAVENOUS

## 2015-12-01 MED ORDER — GLYCOPYRROLATE 0.2 MG/ML IJ SOLN
INTRAMUSCULAR | Status: DC | PRN
  Administered 2015-12-01: 0.2 mg via INTRAVENOUS

## 2015-12-01 MED ORDER — FENTANYL CITRATE (PF) 100 MCG/2ML IJ SOLN
INTRAMUSCULAR | Status: DC | PRN
  Administered 2015-12-01 (×2): 50 ug via INTRAVENOUS
  Administered 2015-12-01: 25 ug via INTRAVENOUS
  Administered 2015-12-01: 50 ug via INTRAVENOUS
  Administered 2015-12-01: 25 ug via INTRAVENOUS
  Administered 2015-12-01: 50 ug via INTRAVENOUS
  Administered 2015-12-01 (×2): 25 ug via INTRAVENOUS

## 2015-12-01 MED ORDER — CEFAZOLIN SODIUM-DEXTROSE 2-4 GM/100ML-% IV SOLN
2.0000 g | INTRAVENOUS | Status: AC
  Administered 2015-12-01: 2 g via INTRAVENOUS
  Filled 2015-12-01: qty 100

## 2015-12-01 SURGICAL SUPPLY — 68 items
APPLIER CLIP 9.375 MED OPEN (MISCELLANEOUS) ×4
APR CLP MED 9.3 20 MLT OPN (MISCELLANEOUS) ×2
BINDER BREAST LRG (GAUZE/BANDAGES/DRESSINGS) IMPLANT
BINDER BREAST XLRG (GAUZE/BANDAGES/DRESSINGS) IMPLANT
BIOPATCH RED 1 DISK 7.0 (GAUZE/BANDAGES/DRESSINGS) ×1 IMPLANT
BIOPATCH RED 1IN DISK 7.0MM (GAUZE/BANDAGES/DRESSINGS) ×1
BLADE SURG 15 STRL LF DISP TIS (BLADE) ×2 IMPLANT
BLADE SURG 15 STRL SS (BLADE) ×4
CANISTER SUCTION 2500CC (MISCELLANEOUS) ×2 IMPLANT
CHLORAPREP W/TINT 10.5 ML (MISCELLANEOUS) ×4 IMPLANT
CHLORAPREP W/TINT 26ML (MISCELLANEOUS) ×4 IMPLANT
CLIP APPLIE 9.375 MED OPEN (MISCELLANEOUS) ×2 IMPLANT
CLIP TI WIDE RED SMALL 6 (CLIP) ×2 IMPLANT
CONT SPEC 4OZ CLIKSEAL STRL BL (MISCELLANEOUS) ×4 IMPLANT
COVER PROBE W GEL 5X96 (DRAPES) ×4 IMPLANT
COVER SURGICAL LIGHT HANDLE (MISCELLANEOUS) ×2 IMPLANT
DEVICE DUBIN SPECIMEN MAMMOGRA (MISCELLANEOUS) ×4 IMPLANT
DRAIN CHANNEL 19F RND (DRAIN) ×2 IMPLANT
DRAPE CHEST BREAST 15X10 FENES (DRAPES) ×4 IMPLANT
DRAPE LAPAROTOMY T 98X78 PEDS (DRAPES) ×2 IMPLANT
DRAPE PROXIMA HALF (DRAPES) ×2 IMPLANT
DRAPE UTILITY XL STRL (DRAPES) ×4 IMPLANT
DRSG TEGADERM 2-3/8X2-3/4 SM (GAUZE/BANDAGES/DRESSINGS) ×2 IMPLANT
ELECT CAUTERY BLADE 6.4 (BLADE) ×4 IMPLANT
ELECT REM PT RETURN 9FT ADLT (ELECTROSURGICAL) ×4
ELECTRODE REM PT RTRN 9FT ADLT (ELECTROSURGICAL) ×2 IMPLANT
EVACUATOR SILICONE 100CC (DRAIN) ×2 IMPLANT
GAUZE SPONGE 4X4 12PLY STRL (GAUZE/BANDAGES/DRESSINGS) ×4 IMPLANT
GAUZE SPONGE 4X4 16PLY XRAY LF (GAUZE/BANDAGES/DRESSINGS) ×2 IMPLANT
GLOVE BIO SURGEON STRL SZ7 (GLOVE) ×2 IMPLANT
GLOVE BIOGEL PI IND STRL 6 (GLOVE) IMPLANT
GLOVE BIOGEL PI IND STRL 6.5 (GLOVE) IMPLANT
GLOVE BIOGEL PI IND STRL 7.0 (GLOVE) IMPLANT
GLOVE BIOGEL PI INDICATOR 6 (GLOVE) ×2
GLOVE BIOGEL PI INDICATOR 6.5 (GLOVE) ×4
GLOVE BIOGEL PI INDICATOR 7.0 (GLOVE) ×2
GLOVE SURG SIGNA 7.5 PF LTX (GLOVE) ×8 IMPLANT
GOWN STRL REUS W/ TWL LRG LVL3 (GOWN DISPOSABLE) ×2 IMPLANT
GOWN STRL REUS W/ TWL XL LVL3 (GOWN DISPOSABLE) ×2 IMPLANT
GOWN STRL REUS W/TWL LRG LVL3 (GOWN DISPOSABLE) ×8
GOWN STRL REUS W/TWL XL LVL3 (GOWN DISPOSABLE) ×4
KIT BASIN OR (CUSTOM PROCEDURE TRAY) ×4 IMPLANT
KIT MARKER MARGIN INK (KITS) ×4 IMPLANT
KIT ROOM TURNOVER OR (KITS) ×4 IMPLANT
LIQUID BAND (GAUZE/BANDAGES/DRESSINGS) ×4 IMPLANT
NDL FILTER BLUNT 18X1 1/2 (NEEDLE) IMPLANT
NDL HYPO 25GX1X1/2 BEV (NEEDLE) ×2 IMPLANT
NDL HYPO 25X1 1.5 SAFETY (NEEDLE) ×2 IMPLANT
NEEDLE FILTER BLUNT 18X 1/2SAF (NEEDLE)
NEEDLE FILTER BLUNT 18X1 1/2 (NEEDLE) IMPLANT
NEEDLE HYPO 25GX1X1/2 BEV (NEEDLE) IMPLANT
NEEDLE HYPO 25X1 1.5 SAFETY (NEEDLE) IMPLANT
NS IRRIG 1000ML POUR BTL (IV SOLUTION) ×6 IMPLANT
PACK GENERAL/GYN (CUSTOM PROCEDURE TRAY) ×4 IMPLANT
PACK SURGICAL SETUP 50X90 (CUSTOM PROCEDURE TRAY) ×2 IMPLANT
PAD ARMBOARD 7.5X6 YLW CONV (MISCELLANEOUS) ×8 IMPLANT
PENCIL BUTTON HOLSTER BLD 10FT (ELECTRODE) ×2 IMPLANT
SUT ETHILON 2 0 FS 18 (SUTURE) ×2 IMPLANT
SUT MNCRL AB 4-0 PS2 18 (SUTURE) ×6 IMPLANT
SUT MON AB 4-0 PC3 18 (SUTURE) ×2 IMPLANT
SUT SILK 2 0 SH (SUTURE) ×2 IMPLANT
SUT VIC AB 3-0 SH 27 (SUTURE)
SUT VIC AB 3-0 SH 27XBRD (SUTURE) ×2 IMPLANT
SUT VIC AB 3-0 SH 8-18 (SUTURE) ×6 IMPLANT
SYR BULB 3OZ (MISCELLANEOUS) ×2 IMPLANT
SYR CONTROL 10ML LL (SYRINGE) ×2 IMPLANT
TOWEL OR 17X24 6PK STRL BLUE (TOWEL DISPOSABLE) ×2 IMPLANT
TOWEL OR 17X26 10 PK STRL BLUE (TOWEL DISPOSABLE) ×4 IMPLANT

## 2015-12-01 NOTE — Anesthesia Preprocedure Evaluation (Signed)
Anesthesia Evaluation  Patient identified by MRN, date of birth, ID band Patient awake    Reviewed: Allergy & Precautions, NPO status , Patient's Chart, lab work & pertinent test results  Airway Mallampati: I  TM Distance: >3 FB Neck ROM: Full    Dental   Pulmonary    Pulmonary exam normal        Cardiovascular hypertension, Pt. on medications Normal cardiovascular exam     Neuro/Psych    GI/Hepatic   Endo/Other    Renal/GU      Musculoskeletal   Abdominal   Peds  Hematology   Anesthesia Other Findings   Reproductive/Obstetrics                             Anesthesia Physical Anesthesia Plan  ASA: II  Anesthesia Plan: General   Post-op Pain Management: GA combined w/ Regional for post-op pain   Induction: Intravenous  Airway Management Planned: LMA  Additional Equipment:   Intra-op Plan:   Post-operative Plan: Extubation in OR  Informed Consent: I have reviewed the patients History and Physical, chart, labs and discussed the procedure including the risks, benefits and alternatives for the proposed anesthesia with the patient or authorized representative who has indicated his/her understanding and acceptance.     Plan Discussed with: CRNA and Surgeon  Anesthesia Plan Comments:         Anesthesia Quick Evaluation

## 2015-12-01 NOTE — Anesthesia Procedure Notes (Addendum)
Anesthesia Regional Block:  Pectoralis block  Pre-Anesthetic Checklist: ,, timeout performed, Correct Patient, Correct Site, Correct Laterality, Correct Procedure, Correct Position, site marked, Risks and benefits discussed,  Surgical consent,  Pre-op evaluation,  At surgeon's request and post-op pain management  Laterality: Left  Prep: chloraprep       Needles:  Injection technique: Single-shot     Needle Length: 9cm 9 cm Needle Gauge: 21 and 21 G    Additional Needles:  Procedures: ultrasound guided (picture in chart) Pectoralis block Narrative:  Start time: 12/01/2015 8:55 AM End time: 12/01/2015 9:05 AM Injection made incrementally with aspirations every 5 mL.  Performed by: Personally  Anesthesiologist: Lillia Abed  Additional Notes: Monitors applied. Patient sedated. Sterile prep and drape,hand hygiene and sterile gloves were used. Relevant anatomy identified.Needle position confirmed.Local anesthetic injected incrementally after negative aspiration. Local anesthetic spread visualized. Vascular puncture avoided. No complications. Image printed for medical record.The patient tolerated the procedure well.       Procedure Name: LMA Insertion Date/Time: 12/01/2015 9:48 AM Performed by: Shirlyn Goltz Pre-anesthesia Checklist: Patient identified Patient Re-evaluated:Patient Re-evaluated prior to inductionOxygen Delivery Method: Circle system utilized Preoxygenation: Pre-oxygenation with 100% oxygen Intubation Type: IV induction Ventilation: Mask ventilation without difficulty LMA: LMA inserted LMA Size: 4.0 Number of attempts: 1 Placement Confirmation: positive ETCO2 and breath sounds checked- equal and bilateral Tube secured with: Tape Dental Injury: Teeth and Oropharynx as per pre-operative assessment     Performed by: Shirlyn Goltz

## 2015-12-01 NOTE — Anesthesia Postprocedure Evaluation (Signed)
Anesthesia Post Note  Patient: [facility]  Procedure(s) Performed: Procedure(s) (LRB): LEFT BREAST SEED GUIDED LUMPECTOMY WITH LEFT AXILLARY NODE DISSECTION  (Left) REMOVAL PORT-A-CATH (Right)  Patient location during evaluation: PACU Anesthesia Type: General Level of consciousness: awake and alert Pain management: pain level controlled Vital Signs Assessment: post-procedure vital signs reviewed and stable Respiratory status: spontaneous breathing, nonlabored ventilation, respiratory function stable and patient connected to nasal cannula oxygen Cardiovascular status: blood pressure returned to baseline and stable Postop Assessment: no signs of nausea or vomiting Anesthetic complications: no    Last Vitals:  Filed Vitals:   12/01/15 1330 12/01/15 1345  BP: 129/74 121/81  Pulse: 73 73  Temp: 36.8 C   Resp: 18 22    Last Pain:  Filed Vitals:   12/01/15 1456  PainSc: Asleep                 Berkleigh Beckles,JAMES TERRILL

## 2015-12-01 NOTE — Op Note (Signed)
12/01/2015  12:11 PM  PATIENT:  Emma [name], 50 y.o., female, MRN: [id number]  PREOP DIAGNOSIS:  LEFT BREAST CANCER  POSTOP DIAGNOSIS:   Left breast cancer, 2 o'clock position (T2, N1)  PROCEDURE:   Procedure(s): LEFT BREAST SEED GUIDED LUMPECTOMY WITH LEFT AXILLARY NODE DISSECTION  (through single incision) REMOVAL Right subclavian PORT-A-CATH  SURGEON:   Alphonsa Overall, M.D.  ASSISTANT:   None  ANESTHESIA:   general  Anesthesiologist: Lillia Abed, MD; Rica Koyanagi, MD CRNA: Lavell Luster, CRNA; Shirlyn Goltz, CRNA  General  ASA:  2  EBL:  100  ml  BLOOD ADMINISTERED: none  DRAINS: none   LOCAL MEDICATIONS USED:   10 cc 1/4% marcaine, left pectoral block  SPECIMEN:   Left breast lumpectomy, left axillary node dissection, highest axillary node  COUNTS CORRECT:  YES  INDICATIONS FOR PROCEDURE:  Emma [name] is a 50 y.o. (DOB: 03-16-1966) AA  female whose primary care physician is ANDY,CAMILLE L, MD and comes for left breast lumpectomy, left axillary node dissection, and removal of power port.       [facility] with Drs. 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  She has undergone neoadjuvant chemotherapy.  Her tumor decreased in size from 6.7 x 3.9 x 4.3 cm to 5 x 3 x 3 cm.  We discussed the option of mastectomy vs lumpectomy.  She wants to try a lumpectomy.  Because she had a positive node, will plan left axillary node dissection.  I will also remove her port at the same time.   The indications and risks of the surgery were explained to the patient.  The risks include, but are not limited to, infection, bleeding, and nerve injury.  OPERATIVE NOTE;  The patient was taken to room # 2 at [facility] where she underwent a general anesthesia  supervised by Anesthesiologist: Lillia Abed, MD; Rica Koyanagi, MD CRNA: Lavell Luster, CRNA; Shirlyn Goltz, CRNA. Her chest and left  axilla were prepped with ChloraPrep and sterilely draped.    A time-out and the surgical check list was reviewed.    I turned attention to the cancer which was about at the 2 o'clock position of the left breast.   I used the Neoprobe to identify the I131 seed.  I tried to excise an area around the tumor of at least 1 cm.    I excised this block of breast tissue approximately 9 cm by 10 cm  in diameter.  She had had 2 biopsies of her left breast, so there are two clips in the breast.  The seed is midway between the two clips.   I painted the lumpectomy specimen with the 6 color paint kit and did a specimen mammogram which confirmed the mass, 2 clips, and the seed were all in the right position in the specimen.  The specimen was sent to pathology who called back to confirm that they have the seed and the specimen.   I went through the left breast incision (one incision) and started the left axillary node dissection.  There were at least two abnormal left axillary nodes.  I labeled the nodes that I took next to the axillary vein as "highest" axillary nodes.  I removed Level I and Level II left axillary nodes.  The block of axillary tissue was sent as axillary node excision.  I identified the long thoracic nerve of Bell  12/01/2015  12:11 PM  PATIENT:  Emma [name], 50 y.o., female, MRN: [id number]  PREOP DIAGNOSIS:  LEFT BREAST CANCER  POSTOP DIAGNOSIS:   Left breast cancer, 2 o'clock position (T2, N1)  PROCEDURE:   Procedure(s): LEFT BREAST SEED GUIDED LUMPECTOMY WITH LEFT AXILLARY NODE DISSECTION  (through single incision) REMOVAL Right subclavian PORT-A-CATH  SURGEON:   Alphonsa Overall, M.D.  ASSISTANT:   None  ANESTHESIA:   general  Anesthesiologist: Lillia Abed, MD; Rica Koyanagi, MD CRNA: Lavell Luster, CRNA; Shirlyn Goltz, CRNA  General  ASA:  2  EBL:  100  ml  BLOOD ADMINISTERED: none  DRAINS: none   LOCAL MEDICATIONS USED:   10 cc 1/4% marcaine, left pectoral block  SPECIMEN:   Left breast lumpectomy, left axillary node dissection, highest axillary node  COUNTS CORRECT:  YES  INDICATIONS FOR PROCEDURE:  Emma [name] is a 50 y.o. (DOB: 03-16-1966) AA  female whose primary care physician is ANDY,CAMILLE L, MD and comes for left breast lumpectomy, left axillary node dissection, and removal of power port.       She was [facility] with Drs. 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  She has undergone neoadjuvant chemotherapy.  Her tumor decreased in size from 6.7 x 3.9 x 4.3 cm to 5 x 3 x 3 cm.  We discussed the option of mastectomy vs lumpectomy.  She wants to try a lumpectomy.  Because she had a positive node, will plan left axillary node dissection.  I will also remove her port at the same time.   The indications and risks of the surgery were explained to the patient.  The risks include, but are not limited to, infection, bleeding, and nerve injury.  OPERATIVE NOTE;  The patient was taken to room # 2 at [facility] where she underwent a general anesthesia  supervised by Anesthesiologist: Lillia Abed, MD; Rica Koyanagi, MD CRNA: Lavell Luster, CRNA; Shirlyn Goltz, CRNA. Her chest and left  axilla were prepped with ChloraPrep and sterilely draped.    A time-out and the surgical check list was reviewed.    I turned attention to the cancer which was about at the 2 o'clock position of the left breast.   I used the Neoprobe to identify the I131 seed.  I tried to excise an area around the tumor of at least 1 cm.    I excised this block of breast tissue approximately 9 cm by 10 cm  in diameter.  She had had 2 biopsies of her left breast, so there are two clips in the breast.  The seed is midway between the two clips.   I painted the lumpectomy specimen with the 6 color paint kit and did a specimen mammogram which confirmed the mass, 2 clips, and the seed were all in the right position in the specimen.  The specimen was sent to pathology who called back to confirm that they have the seed and the specimen.   I went through the left breast incision (one incision) and started the left axillary node dissection.  There were at least two abnormal left axillary nodes.  I labeled the nodes that I took next to the axillary vein as "highest" axillary nodes.  I removed Level I and Level II left axillary nodes.  The block of axillary tissue was sent as axillary node excision.  I identified the long thoracic nerve of Bell

## 2015-12-01 NOTE — Transfer of Care (Signed)
Immediate Anesthesia Transfer of Care Note  Patient: [facility]  Procedure(s) Performed: Procedure(s): LEFT BREAST SEED GUIDED LUMPECTOMY WITH LEFT AXILLARY NODE DISSECTION  (Left) REMOVAL PORT-A-CATH (Right)  Patient Location: PACU  Anesthesia Type:General  Level of Consciousness: awake, alert , oriented and patient cooperative  Airway & Oxygen Therapy: Patient Spontanous Breathing and Patient connected to nasal cannula oxygen  Post-op Assessment: Report given to RN and Post -op Vital signs reviewed and stable  Post vital signs: Reviewed and stable  Last Vitals:  Filed Vitals:   12/01/15 0925 12/01/15 1219  BP: 144/73 134/80  Pulse: 93 67  Temp:  36.7 C  Resp: 23 24    Last Pain: There were no vitals filed for this visit.       Complications: No apparent anesthesia complications

## 2015-12-01 NOTE — Progress Notes (Signed)
Patient not on schedule with nuclear medicine for sentinel node biopsy.  Dr. Lucia Gaskins notified and received new consent order.

## 2015-12-01 NOTE — Interval H&P Note (Signed)
History and Physical Interval Note:  12/01/2015 9:34 AM  [facility]  has presented today for surgery, with the diagnosis of LEFT BREAST CANCER  The various methods of treatment have been discussed with the patient and family. Seed in place.  Husband at hotel room.  She received a block.  After consideration of risks, benefits and other options for treatment, the patient has consented to  Procedure(s): LEFT BREAST SEED GUIDED LUMPECTOMY WITH LEFT AXILLARY NODE DISSECTION  (Left) REMOVAL PORT-A-CATH (N/A) as a surgical intervention .  The patient's history has been reviewed, patient examined, no change in status, stable for surgery.  I have reviewed the patient's chart and labs.  Questions were answered to the patient's satisfaction.     Emma [name] H

## 2015-12-02 DIAGNOSIS — C50912 Malignant neoplasm of unspecified site of left female breast: Secondary | ICD-10-CM | POA: Diagnosis not present

## 2015-12-02 MED ORDER — HYDROCODONE-ACETAMINOPHEN 5-325 MG PO TABS: 1.0000 | ORAL_TABLET | Freq: Four times a day (QID) | ORAL | Status: DC | PRN

## 2015-12-02 NOTE — Discharge Summary (Signed)
Physician Discharge Summary  Patient ID:  Emma [name]  MRN: [id number]  DOB/AGE: 1966-02-03 50 y.o.  Admit date: 12/01/2015 Discharge date: 12/02/2015  Discharge Diagnoses:  1.  Left breast cancer - ILC - T2, N1      Biopsies of the Left breast on 07/10/2015 ([id number]) - showed ILC at the 2 o'clock and 2:30 o'clock position (2 foci), lymph node positive, ER/PR postive, Her2Neu - neg, Ki67 - 5 - 20% Oncology - Drs. Magrinat and Kinard   Active Problems:   Breast cancer in female [facility]) 2.  HTN  Operation: Procedure(s):  LEFT BREAST SEED GUIDED LUMPECTOMY WITH LEFT AXILLARY NODE DISSECTION, REMOVAL PORT-A-CATH on 12/01/2015 - D. Ezzard Standing  Discharged Condition: good  Hospital Course: Emma [name] is an 50 y.o. female whose primary care physician is ANDY,CAMILLE L, MD and who was admitted 12/01/2015 with a chief complaint of left breast cancer.  She was brought to the operating room on 12/01/2015 and underwent  LEFT BREAST SEED GUIDED LUMPECTOMY WITH LEFT AXILLARY NODE DISSECTION, REMOVAL PORT-A-CATH.   She is now one day post op.  She is not having much pain.  The drainage from her left axillary drain is low. She is ready for discharge.  The discharge instructions were reviewed with the patient.  Consults: None  Significant Diagnostic Studies: Results for orders placed or performed during the hospital encounter of 11/24/15  CBC  Result Value Ref Range   WBC 7.3 4.0 - 10.5 K/uL   RBC 4.36 3.87 - 5.11 MIL/uL   Hemoglobin 11.1 (L) 12.0 - 15.0 g/dL   HCT 84.6 (L) 96.2 - 95.2 %   MCV 82.1 78.0 - 100.0 fL   MCH 25.5 (L) 26.0 - 34.0 pg   MCHC 31.0 30.0 - 36.0 g/dL   RDW 84.1 (H) 32.4 - 40.1 %   Platelets 255 150 - 400 K/uL  Basic metabolic panel  Result Value Ref Range   Sodium 141 135 - 145 mmol/L   Potassium 3.9 3.5 - 5.1 mmol/L   Chloride 109 101 - 111 mmol/L   CO2 25 22 - 32 mmol/L   Glucose, Bld 92 65 - 99 mg/dL   BUN 13 6 - 20 mg/dL   Creatinine, Ser  0.27 0.44 - 1.00 mg/dL   Calcium 9.6 8.9 - 25.3 mg/dL   GFR calc non Af Amer >60 >60 mL/min   GFR calc Af Amer >60 >60 mL/min   Anion gap 7 5 - 15  hCG, serum, qualitative  Result Value Ref Range   Preg, Serum WEAK POSITIVE (A) NEGATIVE    Mm Breast Surgical Specimen  12/01/2015  CLINICAL DATA:  Left lumpectomy for invasive lobular carcinoma and lobular carcinoma in situ. EXAM: SPECIMEN RADIOGRAPH OF THE LEFT BREAST COMPARISON:  Previous exam(s). FINDINGS: Status post excision of the left breast. The radioactive seed, spiculated mass, coil shaped biopsy marker clip and ribbon shaped biopsy marker clip are present, completely intact, and were marked for pathology. IMPRESSION: Specimen radiograph of the left breast. Electronically Signed   By: Beckie Salts M.D.   On: 12/01/2015 10:52   Mm Lt Radioactive Seed Loc Mammo Guide  11/30/2015  CLINICAL DATA:  50 year old female with invasive left breast cancer post neoadjuvant chemotherapy presenting for preoperative radioactive seed localization. The coil shaped biopsy marking clip is present at site of the dominant mass and the ribbon shaped biopsy marking clip is present at the site of a satellite lesion. The radioactive seed will be placed centrally between the clips  and within the mass. EXAM: MAMMOGRAPHIC GUIDED RADIOACTIVE SEED LOCALIZATION OF THE LEFT BREAST COMPARISON:  Previous exam(s). FINDINGS: Patient presents for radioactive seed localization prior to left breast lumpectomy. I met with the patient and we discussed the procedure of seed localization including benefits and alternatives. We discussed the high likelihood of a successful procedure. We discussed the risks of the procedure including infection, bleeding, tissue injury and further surgery. We discussed the low dose of radioactivity involved in the procedure. Informed, written consent was given. The usual time-out protocol was performed immediately prior to the procedure. Using mammographic  guidance, sterile technique, 1% lidocaine and an I-125 radioactive seed, the mass in the upper-outer left breast with the adjacent biopsy marking clips were localized using a lateral to medial approach. The follow-up mammogram images confirm the seed in the expected location and were marked for Dr. Ezzard Standing. Follow-up survey of the patient confirms presence of the radioactive seed. Order number of I-125 seed:  [id number]. Total activity:  0.251 millicuries  Reference Date: 11/17/2015 The patient tolerated the procedure well and was [facility]. She was given instructions regarding seed removal. IMPRESSION: 1.  Radioactive seed localization left breast. 2. The radioactive seed is present centrally in the dominant mass with a coil shaped biopsy marking clip located 1.4 cm anterior to the seed. The ribbon shaped biopsy marking clip is located approximately 2.4 cm superior lateral to the radioactive seed. The images were reviewed with Dr. Ezzard Standing at 4:10 p.m. 11/30/2015. Electronically Signed   By: Edwin Cap M.D.   On: 11/30/2015 16:21    Discharge Exam:  Filed Vitals:   12/02/15 0035 12/02/15 0545  BP: 122/62 108/65  Pulse: 70 72  Temp: 98.1 F (36.7 C) 98.2 F (36.8 C)  Resp: 18 18    General: AA F who is alert and generally healthy appearing.  Lungs: Clear to auscultation and symmetric breath sounds. Heart:  RRR. No murmur or rub. Chest:  Left breast wound okay.  Drain - 155 cc last 24 hours  Discharge Medications:     Medication List    TAKE these medications        amLODipine 10 MG tablet  Commonly known as:  NORVASC  Take 10 mg by mouth daily.     ferrous sulfate 325 (65 FE) MG EC tablet  Take 325 mg by mouth daily with breakfast.     HYDROcodone-acetaminophen 5-325 MG tablet  Commonly known as:  NORCO/VICODIN  Take 1-2 tablets by mouth every 6 (six) hours as needed for moderate pain.        Disposition: 01-Home or Self Care      Discharge  Instructions    Diet - low sodium heart healthy    Complete by:  As directed      Increase activity slowly    Complete by:  As directed             Activity:  Driving - May drive in 3 or 4 days, if doing well   Lifting - No lifting more than 15 pounds for 1 week, but use left arm as you would normally.  Wound Care:   May shower starting Monday, 7/24       Empty drain twice daily and record amount of drainage.  Bring the recorded amount of drainage with you to the office.  Diet:  As tolerated  Follow up appointment:  Call Dr. Allene Pyo office [facility]) at [phone] for an appointment in this  week.  Medications and dosages:  Resume your home medications.  You have a prescription for:  Vicodin   Signed: [name], M.D., [facility] Office:  [phone]  12/02/2015, 7:38 AM

## 2015-12-02 NOTE — Discharge Instructions (Signed)
[facility] - DISCHARGE INSTRUCTIONS TO PATIENT  Activity:  Driving - May drive in 3 or 4 days, if doing well   Lifting - No lifting more than 15 pounds for 1 week, but use left arm as you would normally.  Wound Care:   May shower starting Monday, 7/24       Empty drain twice daily and record amount of drainage.  Bring the recorded amount of drainage with you to the office.  Diet:  As tolerated  Follow up appointment:  Call Dr. Pollie Friar office [facility]) at [phone] for an appointment in this week.  Medications and dosages:  Resume your home medications.  You have a prescription for:  Vicodin  Call Dr. Lucia Gaskins or his office  [phone]) if you have:  Temperature greater than 100.4,  Persistent nausea and vomiting,  Severe uncontrolled pain,  Redness, tenderness, or signs of infection (pain, swelling, redness, odor or green/yellow discharge around the site),  Difficulty breathing, headache or visual disturbances,  Any other questions or concerns you may have after discharge.  In an emergency, call 911 or go to an Emergency Department at a nearby hospital.

## 2015-12-03 ENCOUNTER — Encounter (HOSPITAL_COMMUNITY): Payer: Self-pay | Admitting: Surgery

## 2015-12-08 ENCOUNTER — Other Ambulatory Visit: Payer: Self-pay | Admitting: Oncology

## 2015-12-08 NOTE — Progress Notes (Signed)
[facility]  [phone] Fax:(336) [phone]   ID: Emma [name] DOB: 12-26-1965  MR#: [id number]  VFI#:[id number]  Patient Care Team: [name], MD as PCP - [name] (Family Medicine) [name], MD as Consulting Physician ([name] Surgery) Lowella Dell, MD as Consulting Physician (Oncology) Antony Blackbird, MD as Consulting Physician (Radiation Oncology) Salomon Fick, NP as Nurse Practitioner (Hematology and Oncology) Levi Aland, MD as Consulting Physician (Obstetrics and Gynecology) PCP: [name], MD OTHER MD:  CHIEF COMPLAINT: Estrogen receptor positive breast cancer  CURRENT TREATMENT: Neoadjuvant chemotherapy  BREAST CANCER HISTORY: Emma [name] had routine screening mammography at Dr. Ewell Poe office/[facility] 06/23/2015. There was an area of distortion in the left breast. The patient was then referred to the Breast Center where on 07/03/2015 he underwent left diagnostic mammography with tomosynthesis and left breast ultrasonography. The breast density was category B. In the far outer left breast there was an area of distortion measuring approximately 4 cm. There appeared to be a prominent left axillary lymph node. On physical exam at the 2:00 position of the left breast 12 cm from the nipple there was an area of approximately 4 cm of firmness. There was no palpable mass in the left axilla. Ultrasonography confirmed an irregular hypoechoic mass in the upper-outer quadrant of the left breast measuring 2.6 cm. A little closer to the axilla there was a second irregular hypoechoic mass measuring 0.5 cm. The distance between these 2 masses was 3.6 cm. In addition there was a right axillary lymph node with a thickened cortex. It measured 0.9 cm.  On 5 or 20 11/30/2015 the patient underwent biopsy of the 2 areas in the breast as well as the axillary lymph node. The larger of the 2 masses was an invasive lobular carcinoma,  E-cadherin negative, estrogen receptor 100% positive, progesterone receptor 100% positive, both with strong staining intensity, with an MIB-1 of 20%. This second, smaller mass, was also an invasive lobular carcinoma, E-cadherin negative, estrogen receptor 90% positive, progesterone receptor 100% positive, both with strong staining intensity, with an MIB-1 of 5%. The right axillary lymph node was also an invasive mammary carcinoma. And also estrogen receptor positive at 70%, progesterone receptor positive at 90%, with an MIB-1 of 10%.  Her subsequent history is as detailed below  INTERVAL HISTORY: Emma [name] returns for follow up of her breast cancer, accompanied by her husband. Today is day 8, cycle 4 of docetaxel and cyclophosphamide, with neulasta onpro for granulocyte support. She had her final breast MRI earlier today and is here to review the results.  REVIEW OF SYSTEMS: Emma tolerated her last treatment well. She is slightly fatigued, but had no other issues. She has less bone pain this week. The neuropathy to her toes has resolved but she still has some numbness at the ball of one of her feet. A detailed review of systems is otherwise stable.   PAST MEDICAL HISTORY: Past Medical History:  Diagnosis Date  . Breast cancer of upper-outer quadrant of left female breast (HCC) 07/13/2015  . Heart murmur    "nothing to worry about"  . Hypertension     PAST SURGICAL HISTORY: Past Surgical History:  Procedure Laterality Date  . BREAST LUMPECTOMY Left 12/01/2015   LEFT BREAST SEED GUIDED LUMPECTOMY WITH LEFT AXILLARY NODE DISSECTION (Left)  . CESAREAN SECTION  1998  . COLONOSCOPY W/ POLYPECTOMY    . DILATION AND EVACUATION  05/01/2011   Procedure: DILATATION AND EVACUATION;  Surgeon: Levi Aland;  Location: [facility];  Service: Gynecology;  Laterality: N/A;  . FOOT SURGERY Bilateral 1993   bunionectomy  . PORT-A-CATH REMOVAL  12/01/2015  . PORT-A-CATH REMOVAL Right 12/01/2015   Procedure:  REMOVAL PORT-A-CATH;  Surgeon: [name], MD;  Location: [facility] OR;  Service: [name];  Laterality: Right;  . PORTACATH PLACEMENT Right 07/31/2015   Procedure: INSERTION PORT-A-CATH;  Surgeon: [name], MD;  Location: [facility];  Service: [name];  Laterality: Right;  . RADIOACTIVE SEED GUIDED PARTIAL MASTECTOMY/AXILLARY SENTINEL NODE BIOPSY/AXILLARY NODE DISSECTION Left 12/01/2015   Procedure: LEFT BREAST SEED GUIDED LUMPECTOMY WITH LEFT AXILLARY NODE DISSECTION;  Surgeon: [name], MD;  Location: [facility] OR;  Service: [name];  Laterality: Left;    FAMILY HISTORY Family History  Problem Relation Age of Onset  . Colon cancer Mother   The patient's father died at age 65 from "complicated causes". The patient's mother died from colon cancer at the age of 41. He cancer was diagnosed the year prior. The patient has 3 brothers, 11 sisters. There is no history of breast or ovarian cancer and no other colon cancers in the family  GYNECOLOGIC HISTORY:  Patient's last menstrual period was 09/08/2015. Menarche age 53, first live birth age 47, which the patient understands increases the risk of breast cancer. She is GX P2. She still having regular periods as of March 2017. She used birth control remotely with no complications.  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 Her husband Emma [name] works for a Midwife is Chiropodist of environmental services. Emma [name] has a child from a prior marriage, Emma [name], who is in the [employer]. Tenecia as a child from a prior marriage, Emma [name], who is going to school in [place]. The couple have added son, Emma [name], 58 years old, at home. The patient has one grandchild. She goes to a local [employer].    ADVANCED DIRECTIVES: no   HEALTH MAINTENANCE: Social History  Substance Use Topics  . Smoking status: Never Smoker  . Smokeless tobacco:  Never Used  . Alcohol use No     Comment: social     Colonoscopy: January 2016/[facility]  PAP: February 2017  Bone density: Never  Lipid panel:  No Known Allergies  Current Outpatient Prescriptions  Medication Sig Dispense Refill  . amLODipine (NORVASC) 10 MG tablet Take 10 mg by mouth daily.  0  . ferrous sulfate 325 (65 FE) MG EC tablet Take 325 mg by mouth daily with breakfast.    . HYDROcodone-acetaminophen (NORCO/VICODIN) 5-325 MG tablet Take 1-2 tablets by mouth every 6 (six) hours as needed for moderate pain. 30 tablet 0   No current facility-administered medications for this visit.     OBJECTIVE: Middle-aged African-American woman in no acute distress There were no vitals filed for this visit.   There is no height or weight on file to calculate BMI.    ECOG FS:0 - Asymptomatic  Skin: warm, dry  HEENT: sclerae anicteric, conjunctivae pink, oropharynx clear. No thrush or mucositis.  Lymph Nodes: No cervical or supraclavicular lymphadenopathy  Lungs: clear to auscultation bilaterally, no rales, wheezes, or rhonci  Heart: regular rate and rhythm  Abdomen: round, soft, non tender, positive bowel sounds  Musculoskeletal: No focal spinal tenderness, no peripheral edema  Neuro: non focal, well oriented, positive affect  Breasts: deferred  LAB RESULTS:  CMP     Component Value Date/Time   NA 141 11/24/2015 1449  NA 139 10/19/2015 1451   K 3.9 11/24/2015 1449   K 4.2 10/19/2015 1451   CL 109 11/24/2015 1449   CO2 25 11/24/2015 1449   CO2 25 10/19/2015 1451   GLUCOSE 92 11/24/2015 1449   GLUCOSE 92 10/19/2015 1451   BUN 13 11/24/2015 1449   BUN 9.1 10/19/2015 1451   CREATININE 0.88 11/24/2015 1449   CREATININE 1.0 10/19/2015 1451   CALCIUM 9.6 11/24/2015 1449   CALCIUM 9.2 10/19/2015 1451   PROT 6.6 10/19/2015 1451   ALBUMIN 3.5 10/19/2015 1451   AST 26 10/19/2015 1451   ALT 10 10/19/2015 1451   ALKPHOS 62 10/19/2015 1451   BILITOT 0.52 10/19/2015 1451   GFRNONAA  >60 11/24/2015 1449   GFRAA >60 11/24/2015 1449    INo results found for: SPEP, UPEP  Lab Results  Component Value Date   WBC 7.3 11/24/2015   NEUTROABS 8.2 (H) 10/19/2015   HGB 11.1 (L) 11/24/2015   HCT 35.8 (L) 11/24/2015   MCV 82.1 11/24/2015   PLT 255 11/24/2015      Chemistry      Component Value Date/Time   NA 141 11/24/2015 1449   NA 139 10/19/2015 1451   K 3.9 11/24/2015 1449   K 4.2 10/19/2015 1451   CL 109 11/24/2015 1449   CO2 25 11/24/2015 1449   CO2 25 10/19/2015 1451   BUN 13 11/24/2015 1449   BUN 9.1 10/19/2015 1451   CREATININE 0.88 11/24/2015 1449   CREATININE 1.0 10/19/2015 1451      Component Value Date/Time   CALCIUM 9.6 11/24/2015 1449   CALCIUM 9.2 10/19/2015 1451   ALKPHOS 62 10/19/2015 1451   AST 26 10/19/2015 1451   ALT 10 10/19/2015 1451   BILITOT 0.52 10/19/2015 1451      No results found for: LABCA2  No components found for: LABCA125  No results for input(s): INR in the last 168 hours.  Urinalysis No results found for: COLORURINE, APPEARANCEUR, LABSPEC, PHURINE, GLUCOSEU, HGBUR, BILIRUBINUR, KETONESUR, PROTEINUR, UROBILINOGEN, NITRITE, LEUKOCYTESUR   ELIGIBLE FOR AVAILABLE RESEARCH PROTOCOL: Alliance  STUDIES: Mm Breast Surgical Specimen  Result Date: 12/01/2015 CLINICAL DATA:  Left lumpectomy for invasive lobular carcinoma and lobular carcinoma in situ. EXAM: SPECIMEN RADIOGRAPH OF THE LEFT BREAST COMPARISON:  Previous exam(s). FINDINGS: Status post excision of the left breast. The radioactive seed, spiculated mass, coil shaped biopsy marker clip and ribbon shaped biopsy marker clip are present, completely intact, and were marked for pathology. IMPRESSION: Specimen radiograph of the left breast. Electronically Signed   By: Beckie Salts M.D.   On: 12/01/2015 10:52   Mm Lt Radioactive Seed Loc Mammo Guide  Result Date: 11/30/2015 CLINICAL DATA:  50 year old female with invasive left breast cancer post neoadjuvant chemotherapy  presenting for preoperative radioactive seed localization. The coil shaped biopsy marking clip is present at site of the dominant mass and the ribbon shaped biopsy marking clip is present at the site of a satellite lesion. The radioactive seed will be placed centrally between the clips and within the mass. EXAM: MAMMOGRAPHIC GUIDED RADIOACTIVE SEED LOCALIZATION OF THE LEFT BREAST COMPARISON:  Previous exam(s). FINDINGS: Patient presents for radioactive seed localization prior to left breast lumpectomy. I met with the patient and we discussed the procedure of seed localization including benefits and alternatives. We discussed the high likelihood of a successful procedure. We discussed the risks of the procedure including infection, bleeding, tissue injury and further surgery. We discussed the low dose of radioactivity involved in the  procedure. Informed, written consent was given. The usual time-out protocol was performed immediately prior to the procedure. Using mammographic guidance, sterile technique, 1% lidocaine and an I-125 radioactive seed, the mass in the upper-outer left breast with the adjacent biopsy marking clips were localized using a lateral to medial approach. The follow-up mammogram images confirm the seed in the expected location and were marked for Dr. Ezzard Standing. Follow-up survey of the patient confirms presence of the radioactive seed. Order number of I-125 seed:  [id number]. Total activity:  0.251 millicuries  Reference Date: 11/17/2015 The patient tolerated the procedure well and was released from the Breast Center. She was given instructions regarding seed removal. IMPRESSION: 1.  Radioactive seed localization left breast. 2. The radioactive seed is present centrally in the dominant mass with a coil shaped biopsy marking clip located 1.4 cm anterior to the seed. The ribbon shaped biopsy marking clip is located approximately 2.4 cm superior lateral to the radioactive seed. The images were reviewed  with Dr. Ezzard Standing at 4:10 p.m. 11/30/2015. Electronically Signed   By: Edwin Cap M.D.   On: 11/30/2015 16:21    ASSESSMENT: 50 y.o. [facility] woman status post left breast biopsy 07/10/2015 of 2 separate masses as well as left axillary lymph node, all positive for invasive lobular carcinoma, grade 1, estrogen and progesterone receptor positive, HER-2 not amplified, with an MIB-1 between 5 and 20%  (1) neoadjuvan chemotherapy to consist of cyclophosphamide and docetaxel every 3 weeks 4  (2) definitive surgery to follow, with consideration of the Alliance trial  (3) adjuvant radiation to follow surgery  (4) anti-estrogens to start at the completion of local therapy  PLAN: Kyiah is excited to have completed chemotherapy. We reviewed the results of her breast MRI, which showed a positive response to treatment, shrinking the tumor by a little more than a cm. She can still feel the mass of course, but it is noticeably smaller and softer. She meets with Dr. Ezzard Standing on 6/20 to discuss and schedule surgery.  Jennaveve will return at the end of July for follow up with Dr. Darnelle Catalan. She understands and agrees with this plan. She knows the goal of treatment in her case is control. She has been encouraged to call with any issues that might arise before her next visit here.  Lowella Dell, MD   12/08/2015 2:16 PM

## 2015-12-13 ENCOUNTER — Encounter: Payer: Self-pay | Admitting: Genetic Counselor

## 2015-12-15 NOTE — Progress Notes (Signed)
Location of Breast Cancer: Breast cancer of upper-outer quadrant of left female breast   Histology per Pathology Report:   12/01/15 Diagnosis 1. Breast, lumpectomy, Left - INVASIVE LOBULAR CARCINOMA, GRADE II/III, SPANNING 4.7 CM. - INVASIVE CARCINOMA IS FOCALLY LESS THAN 0.1 CM TO THE ANTERIOR MARGIN. - SEE ONCOLOGY TABLE BELOW. 2. Lymph node, biopsy, High Left Axillary - THERE IS NO EVIDENCE OF CARCINOMA IN 1 OF 1 LYMPH NODE (0/1). 3. Lymph nodes, regional resection, Left Axillary - METASTATIC CARCINOMA IN 2 OF 12 LYMPH NODES (2/12).  07/10/15 Diagnosis 1. Breast, left, needle core biopsy, 2:30 o'clock, 14 cm - INVASIVE MAMMARY CARCINOMA. - MAMMARY CARCINOMA IN SITU. - SEE COMMENT. 2. Breast, left, needle core biopsy, 2:00 o'clock, 12 cm - INVASIVE MAMMARY CARCINOMA. - SEE COMMENT. 3. Lymph node, needle/core biopsy, left axillary - ONE LYMPH NODE POSITIVE FOR METASTATIC MAMMARY CARCINOMA. - SEE COMMENT.  Receptor Status: ER(70%), PR (95%), Her2-neu (negative) - from 12/01/15 path  Did patient present with symptoms (if so, please note symptoms) or was this found on screening mammography?: screening mammogram  Past/Anticipated interventions by surgeon, if any: 12/01/15 - Procedure: LEFT BREAST SEED GUIDED LUMPECTOMY WITH LEFT AXILLARY NODE DISSECTION;  Surgeon: Alphonsa Overall, MD;   Past/Anticipated interventions by medical oncology, if any: neoadjuvant chemotherapy to consist of cyclophosphamide and docetaxel every 3 weeks 4.  Anti-estrogen therapy after radiation.  Lymphedema issues, if any:  no  Pain issues, if any:  Soreness left breast  OB Gyn history: Menarche age 57, first live birth age 50. She is GX P2.  She still having regular periods as of March 2017. She used birth control remotely with no complications.  SAFETY ISSUES:  Prior radiation? NO  Pacemaker/ICD? NO  Possible current pregnancy? NO  Is the patient on methotrexate?  NO  Current Complaints / other  details:  Married, 3 children 1 step son, Mother colon, deceased, Patient   States "I'm going to have radiation at [facility]    Jacqulyn Liner, RN 12/15/2015,11:08 AM  BP 127/67 (BP Location: Right Arm, Patient Position: Sitting, Cuff Size: Large)   Pulse 80   Temp 98.1 F (36.7 C) (Oral)   Resp 20   Ht _0  (1.676 m)   Wt 206 lb 14.4 oz (93.8 kg)   LMP 09/08/2015   BMI 33.39 kg/m   Wt Readings from Last 3 Encounters:  12/21/15 206 lb 14.4 oz (93.8 kg)  12/01/15 206 lb (93.4 kg)  11/24/15 205 lb (93 kg)

## 2015-12-21 ENCOUNTER — Ambulatory Visit
Admission: RE | Admit: 2015-12-21 | Discharge: 2015-12-21 | Disposition: A | Discharge status: Home / Self Care | Payer: BC Managed Care – PPO | Source: Ambulatory Visit | Attending: Radiation Oncology | Admitting: Radiation Oncology

## 2015-12-21 ENCOUNTER — Encounter: Payer: Self-pay | Admitting: Radiation Oncology

## 2015-12-21 DIAGNOSIS — C50412 Malignant neoplasm of upper-outer quadrant of left female breast: Secondary | ICD-10-CM

## 2015-12-21 DIAGNOSIS — Z170 Estrogen receptor positive status [ER+]: Secondary | ICD-10-CM | POA: Diagnosis not present

## 2015-12-21 NOTE — Progress Notes (Signed)
Radiation Oncology         [phone] ________________________________  Name: Emma [name] MRN: [id number]  Date: 12/21/2015  DOB: January 28, 1966  Re-evaluation Note   CC: [name], MD  [name], MD    ICD-9-CM ICD-10-CM   1. Breast cancer of upper-outer quadrant of left female breast (HCC) 174.4 C50.412     Diagnosis:   Stage ypT2,ypN1a  invasive lobular carcinoma of the left breast    Narrative:  The patient returns today for re-evaluation. I had previously seen the patient in [facility] on March 8th, 2017.  Patient proceeded to undergo neoadjuvant chemotherapy followed by a left breast lumpectomy and left axillary node dissection on 12/01/2015. Patient was found to have a 4.7 cm residual invasive lobular carcinoma. The closest margin was anterior at 1 mm. 2 out of 13 lymph nodes were positive. Patient has done well since her surgery. Patient does wish to be treated closer to home. She is living in [place] with the closest facility to her being [facility]   The patient is here today to discuss her options for radiotherapy with me.   The patient reports some mild swelling in the left upper arm, but denies swelling in her hand.                                 ALLERGIES:  has No Known Allergies.  Meds: Current Outpatient Prescriptions  Medication Sig Dispense Refill  . amLODipine (NORVASC) 10 MG tablet Take 10 mg by mouth daily.  0  . ferrous sulfate 325 (65 FE) MG EC tablet Take 325 mg by mouth daily with breakfast.    . HYDROcodone-acetaminophen (NORCO/VICODIN) 5-325 MG tablet Take 1-2 tablets by mouth every 6 (six) hours as needed for moderate pain. (Patient not taking: Reported on 12/21/2015) 30 tablet 0   No current facility-administered medications for this encounter.     Physical Findings: The patient is in no acute distress. Patient is alert and oriented.  No palpable supraclavicular or axillary adenopathy. The lungs are clear to  auscultation. The heart has a regular rhythm and rate. The abdomen is soft and nontender with normal bowel sounds. Breast: left breast shows scar in upper outer quadrant which is healing well without signs of drainage or infection.  No dominant mass appreciated in  the breast. No separate axillary scar is noted. No palpable mass or nipple discharge noted in the right breast   height is 5\' 6"  (1.676 m) and weight is 206 lb 14.4 oz (93.8 kg). Her oral temperature is 98.1 F (36.7 C). Her blood pressure is 127/67 and her pulse is 80. Her respiration is 20. .    Lab Findings: Lab Results  Component Value Date   WBC 7.3 11/24/2015   HGB 11.1 (L) 11/24/2015   HCT 35.8 (L) 11/24/2015   MCV 82.1 11/24/2015   PLT 255 11/24/2015    Radiographic Findings: Mm Breast Surgical Specimen  Result Date: 12/01/2015 CLINICAL DATA:  Left lumpectomy for invasive lobular carcinoma and lobular carcinoma in situ. EXAM: SPECIMEN RADIOGRAPH OF THE LEFT BREAST COMPARISON:  Previous exam(s). FINDINGS: Status post excision of the left breast. The radioactive seed, spiculated mass, coil shaped biopsy marker clip and ribbon shaped biopsy marker clip are present, completely intact, and were marked for pathology. IMPRESSION: Specimen radiograph of the left breast. Electronically Signed   By: Beckie Salts M.D.   On: 12/01/2015 10:52  Mm Lt Radioactive Seed Loc Mammo Guide  Result Date: 11/30/2015 CLINICAL DATA:  50 year old female with invasive left breast cancer post neoadjuvant chemotherapy presenting for preoperative radioactive seed localization. The coil shaped biopsy marking clip is present at site of the dominant mass and the ribbon shaped biopsy marking clip is present at the site of a satellite lesion. The radioactive seed will be placed centrally between the clips and within the mass. EXAM: MAMMOGRAPHIC GUIDED RADIOACTIVE SEED LOCALIZATION OF THE LEFT BREAST COMPARISON:  Previous exam(s). FINDINGS: Patient presents  for radioactive seed localization prior to left breast lumpectomy. I met with the patient and we discussed the procedure of seed localization including benefits and alternatives. We discussed the high likelihood of a successful procedure. We discussed the risks of the procedure including infection, bleeding, tissue injury and further surgery. We discussed the low dose of radioactivity involved in the procedure. Informed, written consent was given. The usual time-out protocol was performed immediately prior to the procedure. Using mammographic guidance, sterile technique, 1% lidocaine and an I-125 radioactive seed, the mass in the upper-outer left breast with the adjacent biopsy marking clips were localized using a lateral to medial approach. The follow-up mammogram images confirm the seed in the expected location and were marked for Dr. Ezzard Standing. Follow-up survey of the patient confirms presence of the radioactive seed. Order number of I-125 seed:  [id number]. Total activity:  0.251 millicuries  Reference Date: 11/17/2015 The patient tolerated the procedure well and was released from the Breast Center. She was given instructions regarding seed removal. IMPRESSION: 1.  Radioactive seed localization left breast. 2. The radioactive seed is present centrally in the dominant mass with a coil shaped biopsy marking clip located 1.4 cm anterior to the seed. The ribbon shaped biopsy marking clip is located approximately 2.4 cm superior lateral to the radioactive seed. The images were reviewed with Dr. Ezzard Standing at 4:10 p.m. 11/30/2015. Electronically Signed   By: Edwin Cap M.D.   On: 11/30/2015 16:21    Impression:  Stage ypT2, ypN1a  Invasive lobular carcinoma of the left breast.  Patient would be a good candidate for radiotherapy as a part of breast conservation therapy with elective coverage of the high axilla/supraclavicular region.   Plan:  The patient indicated that she would like her radiotherapy at [facility], which is closer to her address in [place], [place].  I will refer the patient to their radiotherapy clinic for further evaluation and treatment. She will return here for her medical oncology follow-up appointments.   ____________________________________       This document serves as a record of services personally performed by Antony Blackbird, MD. It was created on his behalf by Clare Charon, a trained medical scribe. The creation of this record is based on the scribe's personal observations and the provider's statements to them. This document has been checked and approved by the attending provider.

## 2015-12-21 NOTE — Progress Notes (Signed)
Please see the Nurse Progress Note in the MD Initial Consult Encounter for this patient. 

## 2015-12-26 ENCOUNTER — Telehealth: Payer: Self-pay | Admitting: *Deleted

## 2015-12-26 NOTE — Telephone Encounter (Signed)
Called patient to ask question, lvm for a return call 

## 2015-12-26 NOTE — Telephone Encounter (Signed)
CALLED PATIENT TO INFORM OF [place] APPT. WITH DR. Marcie Bal HORTON ON 01-05-16- ARRIVAL TIME - 1:30 PM, PH. NO - [phone], ADDRESS - [address] [place] [address], SPOKE WITH PATIENT AND SHE IS AWARE OF THIS APPT.

## 2016-01-30 ENCOUNTER — Telehealth: Payer: Self-pay | Admitting: *Deleted

## 2016-01-30 NOTE — Telephone Encounter (Signed)
Left vm for pt to return call for an appt with Dr. Jana Hakim. Pt currently receiving xrt at [facility]. Request return call. Contact information provided.

## 2016-02-02 ENCOUNTER — Telehealth: Payer: Self-pay | Admitting: Oncology

## 2016-02-02 ENCOUNTER — Telehealth: Payer: Self-pay | Admitting: *Deleted

## 2016-02-02 NOTE — Telephone Encounter (Signed)
Pt return call and relate she is currently receiving xrt at [facility] and will complete it at the end of October. Informed pt that I would place a request for her to be seen by Dr. Jana Hakim the beginning of November. Received verbal understanding. Contact information provided.

## 2016-02-02 NOTE — Telephone Encounter (Signed)
lvm to inform pt f 11/2 appt per LOS

## 2016-03-14 ENCOUNTER — Telehealth: Payer: Self-pay | Admitting: Oncology

## 2016-03-14 ENCOUNTER — Encounter: Payer: Self-pay | Admitting: *Deleted

## 2016-03-14 ENCOUNTER — Other Ambulatory Visit (HOSPITAL_BASED_OUTPATIENT_CLINIC_OR_DEPARTMENT_OTHER): Payer: BC Managed Care – PPO

## 2016-03-14 ENCOUNTER — Ambulatory Visit (HOSPITAL_BASED_OUTPATIENT_CLINIC_OR_DEPARTMENT_OTHER): Payer: BC Managed Care – PPO | Admitting: Oncology

## 2016-03-14 VITALS — BP 135/72 | HR 79 | Temp 98.1°F | Resp 18 | Ht 66.0 in | Wt 209.5 lb

## 2016-03-14 DIAGNOSIS — Z170 Estrogen receptor positive status [ER+]: Secondary | ICD-10-CM | POA: Diagnosis not present

## 2016-03-14 DIAGNOSIS — C50412 Malignant neoplasm of upper-outer quadrant of left female breast: Secondary | ICD-10-CM

## 2016-03-14 DIAGNOSIS — C773 Secondary and unspecified malignant neoplasm of axilla and upper limb lymph nodes: Secondary | ICD-10-CM

## 2016-03-14 LAB — CBC WITH DIFFERENTIAL/PLATELET
BASO%: 0.3 % (ref 0.0–2.0)
Basophils Absolute: 0 10*3/uL (ref 0.0–0.1)
EOS%: 1.3 % (ref 0.0–7.0)
Eosinophils Absolute: 0.1 10*3/uL (ref 0.0–0.5)
HCT: 38.9 % (ref 34.8–46.6)
HGB: 12.4 g/dL (ref 11.6–15.9)
LYMPH%: 19.3 % (ref 14.0–49.7)
MCH: 25.1 pg (ref 25.1–34.0)
MCHC: 32 g/dL (ref 31.5–36.0)
MCV: 78.5 fL — AB (ref 79.5–101.0)
MONO#: 0.4 10*3/uL (ref 0.1–0.9)
MONO%: 9.2 % (ref 0.0–14.0)
NEUT%: 69.9 % (ref 38.4–76.8)
NEUTROS ABS: 3.4 10*3/uL (ref 1.5–6.5)
Platelets: 216 10*3/uL (ref 145–400)
RBC: 4.96 10*6/uL (ref 3.70–5.45)
RDW: 17.5 % — ABNORMAL HIGH (ref 11.2–14.5)
WBC: 4.8 10*3/uL (ref 3.9–10.3)
lymph#: 0.9 10*3/uL (ref 0.9–3.3)

## 2016-03-14 LAB — COMPREHENSIVE METABOLIC PANEL
ALBUMIN: 3.4 g/dL — AB (ref 3.5–5.0)
ALK PHOS: 76 U/L (ref 40–150)
ALT: 9 U/L (ref 0–55)
ANION GAP: 7 meq/L (ref 3–11)
AST: 14 U/L (ref 5–34)
BUN: 11.1 mg/dL (ref 7.0–26.0)
CALCIUM: 9.2 mg/dL (ref 8.4–10.4)
CO2: 27 mEq/L (ref 22–29)
CREATININE: 0.8 mg/dL (ref 0.6–1.1)
Chloride: 109 mEq/L (ref 98–109)
EGFR: >90 mL/min/{1.73_m2} (ref >90)
Glucose: 97 mg/dl (ref 70–140)
POTASSIUM: 3.7 meq/L (ref 3.5–5.1)
Sodium: 143 mEq/L (ref 136–145)
Total Bilirubin: 0.46 mg/dL (ref 0.20–1.20)
Total Protein: 7.6 g/dL (ref 6.4–8.3)

## 2016-03-14 MED ORDER — TAMOXIFEN CITRATE 20 MG PO TABS: 20.0000 mg | ORAL_TABLET | Freq: Every day | ORAL | 12 refills | Status: AC

## 2016-03-14 NOTE — Progress Notes (Signed)
[facility]  [phone] [phone]   ID: Emma [name] DOB: Oct 15, 1965  MR#: [id number]  QMV#:[id number]  Patient Care Team: [name], MD as PCP - General (Family Medicine) [name], MD as Consulting Physician (General Surgery) Lowella Dell, MD as Consulting Physician (Oncology) Antony Blackbird, MD as Consulting Physician (Radiation Oncology) Levi Aland, MD as Consulting Physician (Obstetrics and Gynecology) Lyanne Co, MD as Referring Physician (Radiology) PCP: [name], MD OTHER MD:  CHIEF COMPLAINT: Estrogen receptor positive breast cancer  CURRENT TREATMENT:  To start anti-estrogens  BREAST CANCER HISTORY: Heidee had routine screening mammography at Dr. Ewell Poe office/[facility] 06/23/2015. There was an area of distortion in the left breast. The patient was then referred to the [facility] where on 07/03/2015 he underwent left diagnostic mammography with tomosynthesis and left breast ultrasonography. The breast density was category B. In the far outer left breast there was an area of distortion measuring approximately 4 cm. There appeared to be a prominent left axillary lymph node. On physical exam at the 2:00 position of the left breast 12 cm from the nipple there was an area of approximately 4 cm of firmness. There was no palpable mass in the left axilla. Ultrasonography confirmed an irregular hypoechoic mass in the upper-outer quadrant of the left breast measuring 2.6 cm. A little closer to the axilla there was a second irregular hypoechoic mass measuring 0.5 cm. The distance between these 2 masses was 3.6 cm. In addition there was a right axillary lymph node with a thickened cortex. It measured 0.9 cm.  On 5 or 20 11/30/2015 the patient underwent biopsy of the 2 areas in the breast as well as the axillary lymph node. The larger of the 2 masses was an invasive lobular carcinoma, E-cadherin negative, estrogen  receptor 100% positive, progesterone receptor 100% positive, both with strong staining intensity, with an MIB-1 of 20%. This second, smaller mass, was also an invasive lobular carcinoma, E-cadherin negative, estrogen receptor 90% positive, progesterone receptor 100% positive, both with strong staining intensity, with an MIB-1 of 5%. The right axillary lymph node was also an invasive mammary carcinoma. And also estrogen receptor positive at 70%, progesterone receptor positive at 90%, with an MIB-1 of 10%.  Her subsequent history is as detailed below  INTERVAL HISTORY: Khalea returns for follow up of her estrogen receptor positive breast cancer accompanied by her husband [name]. After completing chemotherapy, she proceeded to left lumpectomy and axillary lymph node dissection of right because 12/01/2015. The final pathology ([id number]) showed an area of residual invasive lobular carcinoma measuring 4.7 cm. Margins were negative but close (anterior). 2 of 13 lymph nodes examined were positive. Repeat prognostic panel found a tumor to be 70% estrogen receptor positive with weak staining intensity, 95% progesterone receptor positive with strong staining intensity, and HER-2 not amplified with a signals ratio of 1.11 and number per cell 1.55.  She then was evaluated by radiation oncology and received a total of 50 gray to the left breast, axilla and supraclavicular nodes with an additional 12 gray boost to the breast primary site. This was completed 03/07/2016. She is here today to discuss anti-estrogens.   REVIEW OF SYSTEMS: Kloi thought to the radiation was "tedious" which is another way of saying that she had no significant problems from it. She would get treated after going to work then would go home and rests. She had hyperpigmentation but no desquamation. She is still using lotions appropriately. She said learning  to do the "breathing" was the most difficult part. Aside from that it detailed review  of systems today was noncontributory  PAST MEDICAL HISTORY: Past Medical History:  Diagnosis Date  . Breast cancer of upper-outer quadrant of left female breast (HCC) 07/13/2015  . Heart murmur    "nothing to worry about"  . Hypertension     PAST SURGICAL HISTORY: Past Surgical History:  Procedure Laterality Date  . BREAST LUMPECTOMY Left 12/01/2015   LEFT BREAST SEED GUIDED LUMPECTOMY WITH LEFT AXILLARY NODE DISSECTION (Left)  . CESAREAN SECTION  1998  . COLONOSCOPY W/ POLYPECTOMY    . DILATION AND EVACUATION  05/01/2011   Procedure: DILATATION AND EVACUATION;  Surgeon: Levi Aland;  Location: [facility];  Service: Gynecology;  Laterality: N/A;  . FOOT SURGERY Bilateral 1993   bunionectomy  . PORT-A-CATH REMOVAL  12/01/2015  . PORT-A-CATH REMOVAL Right 12/01/2015   Procedure: REMOVAL PORT-A-CATH;  Surgeon: [name], MD;  Location: [facility] OR;  Service: General;  Laterality: Right;  . PORTACATH PLACEMENT Right 07/31/2015   Procedure: INSERTION PORT-A-CATH;  Surgeon: [name], MD;  Location: [facility];  Service: General;  Laterality: Right;  . RADIOACTIVE SEED GUIDED PARTIAL MASTECTOMY/AXILLARY SENTINEL NODE BIOPSY/AXILLARY NODE DISSECTION Left 12/01/2015   Procedure: LEFT BREAST SEED GUIDED LUMPECTOMY WITH LEFT AXILLARY NODE DISSECTION;  Surgeon: [name], MD;  Location: [facility] OR;  Service: General;  Laterality: Left;    FAMILY HISTORY Family History  Problem Relation Age of Onset  . Colon cancer Mother   The patient's father died at age 7 from "complicated causes". The patient's mother died from colon cancer at the age of 60. He cancer was diagnosed the year prior. The patient has 3 brothers, 11 sisters. There is no history of breast or ovarian cancer and no other colon cancers in the family  GYNECOLOGIC HISTORY:  No LMP recorded. Menarche age 50, first live birth age 50, which the patient understands increases the risk of breast cancer. She is GX P2. She  still having regular periods as of March 2017, but periods stopped during chemotherapy. So far there have not resumed. She used birth control remotely with no complications.  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 Her husband [name] works for a Midwife is Chiropodist of environmental services. [name] has a child from a prior marriage, Tahya Raak, who is in the [employer]. Anita as a child from a prior marriage, [employer], who is going to school in [place]. The couple have a son, [name], 8 years old, at home. The patient has one grandchild. She goes to a local [employer].    ADVANCED DIRECTIVES: no   HEALTH MAINTENANCE: Social History  Substance Use Topics  . Smoking status: Never Smoker  . Smokeless tobacco: Never Used  . Alcohol use No     Comment: social     Colonoscopy: January 2016/[facility]  PAP: February 2017  Bone density: Never  Lipid panel:  No Known Allergies  Current Outpatient Prescriptions  Medication Sig Dispense Refill  . amLODipine (NORVASC) 10 MG tablet Take 10 mg by mouth daily.  0  . ferrous sulfate 325 (65 FE) MG EC tablet Take 325 mg by mouth daily with breakfast.    . HYDROcodone-acetaminophen (NORCO/VICODIN) 5-325 MG tablet Take 1-2 tablets by mouth every 6 (six) hours as needed for moderate pain. (Patient not taking: Reported on 12/21/2015) 30 tablet 0  .  tamoxifen (NOLVADEX) 20 MG tablet Take 1 tablet (20 mg total) by mouth daily. 90 tablet 12   No current facility-administered medications for this visit.     OBJECTIVE: Middle-aged African-American woman Who appears well Vitals:   03/14/16 1326  BP: 135/72  Pulse: 79  Resp: 18  Temp: 98.1 F (36.7 C)     Body mass index is 33.81 kg/m.    ECOG FS:0 - Asymptomatic  Sclerae unicteric, pupils round and equal Oropharynx clear and moist-- no thrush or other lesions No cervical or  supraclavicular adenopathy Lungs no rales or rhonchi Heart regular rate and rhythm Abd soft, nontender, positive bowel sounds MSK no focal spinal tenderness, no upper extremity lymphedema Neuro: nonfocal, well oriented, appropriate affect Breasts: The right breast is unremarkable. The left breast is status post lumpectomy and recent radiation. There is significant hyperpigmentation, but no desquamation. There is no tenderness to palpation. There is no evidence of local recurrence. The left axilla is benign    LAB RESULTS:  CMP     Component Value Date/Time   NA 143 03/14/2016 1317   K 3.7 03/14/2016 1317   CL 109 11/24/2015 1449   CO2 27 03/14/2016 1317   GLUCOSE 97 03/14/2016 1317   BUN 11.1 03/14/2016 1317   CREATININE 0.8 03/14/2016 1317   CALCIUM 9.2 03/14/2016 1317   PROT 7.6 03/14/2016 1317   ALBUMIN 3.4 (L) 03/14/2016 1317   AST 14 03/14/2016 1317   ALT 9 03/14/2016 1317   ALKPHOS 76 03/14/2016 1317   BILITOT 0.46 03/14/2016 1317   GFRNONAA >60 11/24/2015 1449   GFRAA >60 11/24/2015 1449    INo results found for: SPEP, UPEP  Lab Results  Component Value Date   WBC 4.8 03/14/2016   NEUTROABS 3.4 03/14/2016   HGB 12.4 03/14/2016   HCT 38.9 03/14/2016   MCV 78.5 (L) 03/14/2016   PLT 216 03/14/2016      Chemistry      Component Value Date/Time   NA 143 03/14/2016 1317   K 3.7 03/14/2016 1317   CL 109 11/24/2015 1449   CO2 27 03/14/2016 1317   BUN 11.1 03/14/2016 1317   CREATININE 0.8 03/14/2016 1317      Component Value Date/Time   CALCIUM 9.2 03/14/2016 1317   ALKPHOS 76 03/14/2016 1317   AST 14 03/14/2016 1317   ALT 9 03/14/2016 1317   BILITOT 0.46 03/14/2016 1317      No results found for: LABCA2  No components found for: LABCA125  No results for input(s): INR in the last 168 hours.  Urinalysis No results found for: COLORURINE, APPEARANCEUR, LABSPEC, PHURINE, GLUCOSEU, HGBUR, BILIRUBINUR, KETONESUR, PROTEINUR, UROBILINOGEN, NITRITE,  LEUKOCYTESUR   ELIGIBLE FOR AVAILABLE RESEARCH PROTOCOL: PALLAS  STUDIES: No results found.  ASSESSMENT: 50 y.o. [facility] woman status post left breast biopsy 07/10/2015 of 2 separate masses as well as left axillary lymph node, all positive for invasive lobular carcinoma, grade 1, estrogen and progesterone receptor positive, HER-2 not amplified, with an MIB-1 between 5 and 20%  (1) neoadjuvan chemotherapy consisting of cyclophosphamide and docetaxel every 3 weeks 4, First dose 08/07/2015, final dose 10/12/2015  (2) status post left lumpectomy and axillary lymph node dissection 12/01/2015 for a residual pT2 pN1, stage IIA invasive lobular carcinoma, repeat prognostic panel again estrogen and progesterone receptor positive, HER-2 not amplified. Margins were close but negative   (3) adjuvant radiation at [facility], completed 03/07/2016 ----------------------------------------------------------------------------------------------------- LT breast/IMN, PWT 5000cGy 35 200cGy 9/14 - 02/28/16 ----------------------------------------------------------------------------------------------------- LT  SCV/AX, RAO.LPO 5000cGy 35 200cGy 9/14 - 02/28/16 ----------------------------------------------------------------------------------------------------- LT TB boost, bouquet 1200cGy 8 200cGy 10/19 - 03/07/16  ----------------------------------------------------------------------------------------------------- Totals 6200cGy   (4) to start tamoxifen 03/25/2016.  (5) consider PALLAS trial--anticipate enrolling in January  PLAN: Britnie has done remarkably well with her treatment so far. She is now ready to start anti-estrogens.  Because she was still premenopausal at the time of starting chemotherapy, we are going to go with tamoxifen. We discussed the possible toxicities, side effects and complications of this agent in detail. We also discussed the likely benefits. She is eager to get started,  but I encouraged her to take a slightly longer "medication" from treatment so the starting date for tamoxifen will be 03/25/2016.  The main concern I have with her is that there was a significant amount of residual disease after standard neoadjuvant chemotherapy. I would be interested in her receiving palbociclib and strongly encouraged her to participate in the PALLAS trial. Our research nurse met with her today and gave her the consent form to read over. We will go over it again when Velna Hatchet returns to see me 04/15/2016.  She knows to call for any problems that may develop before that visit. Lowella Dell, MD   03/14/2016 2:38 PM

## 2016-03-14 NOTE — Telephone Encounter (Signed)
Gave patient avs report and appointments for December  °

## 2016-03-18 ENCOUNTER — Other Ambulatory Visit: Payer: Self-pay | Admitting: Oncology

## 2016-03-18 NOTE — Progress Notes (Signed)
[facility]  [phone] [phone]   ID: Emma [name] DOB: Jan 27, 1966  MR#: [id number]  ZHY#:[id number]  Patient Care Team: [name], MD as PCP - General (Family Medicine) [name], MD as Consulting Physician (General Surgery) Lowella Dell, MD as Consulting Physician (Oncology) Antony Blackbird, MD as Consulting Physician (Radiation Oncology) Levi Aland, MD as Consulting Physician (Obstetrics and Gynecology) Lyanne Co, MD as Referring Physician (Radiology) Cira Rue, RN as Registered Nurse PCP: [name], MD OTHER MD:  CHIEF COMPLAINT: Estrogen receptor positive breast cancer  CURRENT TREATMENT:  To start anti-estrogens  BREAST CANCER HISTORY: Emma [name] had routine screening mammography at Dr. Ewell Poe office/[facility] 06/23/2015. There was an area of distortion in the left breast. The patient was then referred to the Breast Center where on 07/03/2015 he underwent left diagnostic mammography with tomosynthesis and left breast ultrasonography. The breast density was category B. In the far outer left breast there was an area of distortion measuring approximately 4 cm. There appeared to be a prominent left axillary lymph node. On physical exam at the 2:00 position of the left breast 12 cm from the nipple there was an area of approximately 4 cm of firmness. There was no palpable mass in the left axilla. Ultrasonography confirmed an irregular hypoechoic mass in the upper-outer quadrant of the left breast measuring 2.6 cm. A little closer to the axilla there was a second irregular hypoechoic mass measuring 0.5 cm. The distance between these 2 masses was 3.6 cm. In addition there was a right axillary lymph node with a thickened cortex. It measured 0.9 cm.  On 5 or 20 11/30/2015 the patient underwent biopsy of the 2 areas in the breast as well as the axillary lymph node. The larger of the 2 masses was an invasive lobular  carcinoma, E-cadherin negative, estrogen receptor 100% positive, progesterone receptor 100% positive, both with strong staining intensity, with an MIB-1 of 20%. This second, smaller mass, was also an invasive lobular carcinoma, E-cadherin negative, estrogen receptor 90% positive, progesterone receptor 100% positive, both with strong staining intensity, with an MIB-1 of 5%. The right axillary lymph node was also an invasive mammary carcinoma. And also estrogen receptor positive at 70%, progesterone receptor positive at 90%, with an MIB-1 of 10%.  Her subsequent history is as detailed below  INTERVAL HISTORY: Emma [name] returns for follow up of her estrogen receptor positive breast cancer accompanied by her husband [name]. After completing chemotherapy, she proceeded to left lumpectomy and axillary lymph node dissection of right because 12/01/2015. The final pathology ([id number]) showed an area of residual invasive lobular carcinoma measuring 4.7 cm. Margins were negative but close (anterior). 2 of 13 lymph nodes examined were positive. Repeat prognostic panel found a tumor to be 70% estrogen receptor positive with weak staining intensity, 95% progesterone receptor positive with strong staining intensity, and HER-2 not amplified with a signals ratio of 1.11 and number per cell 1.55.  She then was evaluated by radiation oncology and received a total of 50 gray to the left breast, axilla and supraclavicular nodes with an additional 12 gray boost to the breast primary site. This was completed 03/07/2016. She is here today to discuss anti-estrogens.   REVIEW OF SYSTEMS: Bismah thought to the radiation was "tedious" which is another way of saying that she had no significant problems from it. She would get treated after going to work then would go home and rests. She had hyperpigmentation but no desquamation. She is  still using lotions appropriately. She said learning to do the "breathing" was the most difficult  part. Aside from that it detailed review of systems today was noncontributory  PAST MEDICAL HISTORY: Past Medical History:  Diagnosis Date  . Breast cancer of upper-outer quadrant of left female breast (HCC) 07/13/2015  . Heart murmur    "nothing to worry about"  . Hypertension     PAST SURGICAL HISTORY: Past Surgical History:  Procedure Laterality Date  . BREAST LUMPECTOMY Left 12/01/2015   LEFT BREAST SEED GUIDED LUMPECTOMY WITH LEFT AXILLARY NODE DISSECTION (Left)  . CESAREAN SECTION  1998  . COLONOSCOPY W/ POLYPECTOMY    . DILATION AND EVACUATION  05/01/2011   Procedure: DILATATION AND EVACUATION;  Surgeon: Levi Aland;  Location: [facility];  Service: Gynecology;  Laterality: N/A;  . FOOT SURGERY Bilateral 1993   bunionectomy  . PORT-A-CATH REMOVAL  12/01/2015  . PORT-A-CATH REMOVAL Right 12/01/2015   Procedure: REMOVAL PORT-A-CATH;  Surgeon: [name], MD;  Location: [facility] OR;  Service: General;  Laterality: Right;  . PORTACATH PLACEMENT Right 07/31/2015   Procedure: INSERTION PORT-A-CATH;  Surgeon: [name], MD;  Location: [facility];  Service: General;  Laterality: Right;  . RADIOACTIVE SEED GUIDED PARTIAL MASTECTOMY/AXILLARY SENTINEL NODE BIOPSY/AXILLARY NODE DISSECTION Left 12/01/2015   Procedure: LEFT BREAST SEED GUIDED LUMPECTOMY WITH LEFT AXILLARY NODE DISSECTION;  Surgeon: [name], MD;  Location: [facility] OR;  Service: General;  Laterality: Left;    FAMILY HISTORY Family History  Problem Relation Age of Onset  . Colon cancer Mother   The patient's father died at age 33 from "complicated causes". The patient's mother died from colon cancer at the age of 6. He cancer was diagnosed the year prior. The patient has 3 brothers, 11 sisters. There is no history of breast or ovarian cancer and no other colon cancers in the family  GYNECOLOGIC HISTORY:  No LMP recorded. Menarche age 40, first live birth age 35, which the patient understands increases the risk  of breast cancer. She is GX P2. She still having regular periods as of March 2017, but periods stopped during chemotherapy. So far there have not resumed. She used birth control remotely with no complications.  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 Her husband [name] works for a Midwife is Chiropodist of environmental services. [name] has a child from a prior marriage, Terren Loe, who is in the [employer]. Graylyn as a child from a prior marriage, [employer], who is going to school in [place]. The couple have a son, [name], 70 years old, at home. The patient has one grandchild. She goes to a local [employer].    ADVANCED DIRECTIVES: no   HEALTH MAINTENANCE: Social History  Substance Use Topics  . Smoking status: Never Smoker  . Smokeless tobacco: Never Used  . Alcohol use No     Comment: social     Colonoscopy: January 2016/[facility]  PAP: February 2017  Bone density: Never  Lipid panel:  No Known Allergies  Current Outpatient Prescriptions  Medication Sig Dispense Refill  . amLODipine (NORVASC) 10 MG tablet Take 10 mg by mouth daily.  0  . ferrous sulfate 325 (65 FE) MG EC tablet Take 325 mg by mouth daily with breakfast.    . HYDROcodone-acetaminophen (NORCO/VICODIN) 5-325 MG tablet Take 1-2 tablets by mouth every 6 (six) hours as needed for moderate pain. (Patient not taking:  Reported on 12/21/2015) 30 tablet 0  . tamoxifen (NOLVADEX) 20 MG tablet Take 1 tablet (20 mg total) by mouth daily. 90 tablet 12   No current facility-administered medications for this visit.     OBJECTIVE: Middle-aged African-American woman Who appears well There were no vitals filed for this visit.   There is no height or weight on file to calculate BMI.    ECOG FS:0 - Asymptomatic  Sclerae unicteric, pupils round and equal Oropharynx clear and moist-- no thrush or other lesions No cervical  or supraclavicular adenopathy Lungs no rales or rhonchi Heart regular rate and rhythm Abd soft, nontender, positive bowel sounds MSK no focal spinal tenderness, no upper extremity lymphedema Neuro: nonfocal, well oriented, appropriate affect Breasts: The right breast is unremarkable. The left breast is status post lumpectomy and recent radiation. There is significant hyperpigmentation, but no desquamation. There is no tenderness to palpation. There is no evidence of local recurrence. The left axilla is benign    LAB RESULTS:  CMP     Component Value Date/Time   NA 143 03/14/2016 1317   K 3.7 03/14/2016 1317   CL 109 11/24/2015 1449   CO2 27 03/14/2016 1317   GLUCOSE 97 03/14/2016 1317   BUN 11.1 03/14/2016 1317   CREATININE 0.8 03/14/2016 1317   CALCIUM 9.2 03/14/2016 1317   PROT 7.6 03/14/2016 1317   ALBUMIN 3.4 (L) 03/14/2016 1317   AST 14 03/14/2016 1317   ALT 9 03/14/2016 1317   ALKPHOS 76 03/14/2016 1317   BILITOT 0.46 03/14/2016 1317   GFRNONAA >60 11/24/2015 1449   GFRAA >60 11/24/2015 1449    INo results found for: SPEP, UPEP  Lab Results  Component Value Date   WBC 4.8 03/14/2016   NEUTROABS 3.4 03/14/2016   HGB 12.4 03/14/2016   HCT 38.9 03/14/2016   MCV 78.5 (L) 03/14/2016   PLT 216 03/14/2016      Chemistry      Component Value Date/Time   NA 143 03/14/2016 1317   K 3.7 03/14/2016 1317   CL 109 11/24/2015 1449   CO2 27 03/14/2016 1317   BUN 11.1 03/14/2016 1317   CREATININE 0.8 03/14/2016 1317      Component Value Date/Time   CALCIUM 9.2 03/14/2016 1317   ALKPHOS 76 03/14/2016 1317   AST 14 03/14/2016 1317   ALT 9 03/14/2016 1317   BILITOT 0.46 03/14/2016 1317      No results found for: LABCA2  No components found for: LABCA125  No results for input(s): INR in the last 168 hours.  Urinalysis No results found for: COLORURINE, APPEARANCEUR, LABSPEC, PHURINE, GLUCOSEU, HGBUR, BILIRUBINUR, KETONESUR, PROTEINUR, UROBILINOGEN, NITRITE,  LEUKOCYTESUR   ELIGIBLE FOR AVAILABLE RESEARCH PROTOCOL: PALLAS  STUDIES: No results found.  ASSESSMENT: 50 y.o. [facility] woman status post left breast biopsy 07/10/2015 of 2 separate masses as well as left axillary lymph node, all positive for invasive lobular carcinoma, grade 1, estrogen and progesterone receptor positive, HER-2 not amplified, with an MIB-1 between 5 and 20%  (1) neoadjuvan chemotherapy consisting of cyclophosphamide and docetaxel every 3 weeks 4, First dose 08/07/2015, final dose 10/12/2015  (2) status post left lumpectomy and axillary lymph node dissection 12/01/2015 for a residual pT2 pN1, stage IIB invasive lobular carcinoma, repeat prognostic panel again estrogen and progesterone receptor positive, HER-2 not amplified. Margins were close but negative   (3) adjuvant radiation at [facility], completed 03/07/2016 ----------------------------------------------------------------------------------------------------- LT breast/IMN, PWT 5000cGy 35 200cGy 9/14 - 02/28/16 ----------------------------------------------------------------------------------------------------- LT SCV/AX,  RAO.LPO 5000cGy 35 200cGy 9/14 - 02/28/16 ----------------------------------------------------------------------------------------------------- LT TB boost, bouquet 1200cGy 8 200cGy 10/19 - 03/07/16  ----------------------------------------------------------------------------------------------------- Totals 6200cGy   (4) to start tamoxifen 03/25/2016.  (5) consider PALLAS trial--anticipate enrolling in January  PLAN: Addaley has done remarkably well with her treatment so far. She is now ready to start anti-estrogens.  Because she was still premenopausal at the time of starting chemotherapy, we are going to go with tamoxifen. We discussed the possible toxicities, side effects and complications of this agent in detail. We also discussed the likely benefits. She is eager to get started,  but I encouraged her to take a slightly longer "medication" from treatment so the starting date for tamoxifen will be 03/25/2016.  The main concern I have with her is that there was a significant amount of residual disease after standard neoadjuvant chemotherapy. I would be interested in her receiving palbociclib and strongly encouraged her to participate in the PALLAS trial. Our research nurse met with her today and gave her the consent form to read over. We will go over it again when Velna Hatchet returns to see me 04/15/2016.  She knows to call for any problems that may develop before that visit. Lowella Dell, MD   03/18/2016 9:05 AM

## 2016-03-25 ENCOUNTER — Other Ambulatory Visit: Payer: Self-pay | Admitting: Oncology

## 2016-03-25 NOTE — Progress Notes (Signed)
[name]  Telephone:([phone]) [phone] [phone]   ID: Emma [name] DOB: October 24, 1965  MR#: [id number]  DGL#:[id number]  Patient Care Team: Emma Ora, MD as PCP - General (Family Medicine) Emma Kin, MD as Consulting Physician (General Surgery) Emma Dell, MD as Consulting Physician (Oncology) Emma Blackbird, MD as Consulting Physician (Radiation Oncology) Emma Aland, MD as Consulting Physician (Obstetrics and Gynecology) Emma Co, MD as Referring Physician (Radiology) Emma Rue, RN as Registered Nurse PCP: Emma Ora, MD OTHER MD:  CHIEF COMPLAINT: Estrogen receptor positive breast cancer  CURRENT TREATMENT:  To start anti-estrogens  BREAST CANCER HISTORY: Emma [name] had routine screening mammography at Dr. Ewell [name] office/[name] 06/23/2015. There was an area of distortion in the left breast. The patient was then referred to the [facility] where on 07/03/2015 he underwent left diagnostic mammography with tomosynthesis and left breast ultrasonography. The breast density was category B. In the far outer left breast there was an area of distortion measuring approximately 4 cm. There appeared to be a prominent left axillary lymph node. On physical exam at the 2:00 position of the left breast 12 cm from the nipple there was an area of approximately 4 cm of firmness. There was no palpable mass in the left axilla. Ultrasonography confirmed an irregular hypoechoic mass in the upper-outer quadrant of the left breast measuring 2.6 cm. A little closer to the axilla there was a second irregular hypoechoic mass measuring 0.5 cm. The distance between these 2 masses was 3.6 cm. In addition there was a right axillary lymph node with a thickened cortex. It measured 0.9 cm.  On 5 or 20 11/30/2015 the patient underwent biopsy of the 2 areas in the breast as well as the axillary lymph node. The larger of the 2 masses was an invasive lobular  carcinoma, E-cadherin negative, estrogen receptor 100% positive, progesterone receptor 100% positive, both with strong staining intensity, with an MIB-1 of 20%. This second, smaller mass, was also an invasive lobular carcinoma, E-cadherin negative, estrogen receptor 90% positive, progesterone receptor 100% positive, both with strong staining intensity, with an MIB-1 of 5%. The right axillary lymph node was also an invasive mammary carcinoma. And also estrogen receptor positive at 70%, progesterone receptor positive at 90%, with an MIB-1 of 10%.  Her subsequent history is as detailed below  INTERVAL HISTORY: Emma [name] returns for follow up of her estrogen receptor positive breast cancer accompanied by her husband Emma [name]. After completing chemotherapy, she proceeded to left lumpectomy and axillary lymph node dissection of right because 12/01/2015. The final pathology ([id number]) showed an area of residual invasive lobular carcinoma measuring 4.7 cm. Margins were negative but close (anterior). 2 of 13 lymph nodes examined were positive. Repeat prognostic panel found a tumor to be 70% estrogen receptor positive with weak staining intensity, 95% progesterone receptor positive with strong staining intensity, and HER-2 not amplified with a signals ratio of 1.11 and number per cell 1.55.  She then was evaluated by radiation oncology and received a total of 50 gray to the left breast, axilla and supraclavicular nodes with an additional 12 gray boost to the breast primary site. This was completed 03/07/2016. She is here today to discuss anti-estrogens.   REVIEW OF SYSTEMS: Emma [name] thought to the radiation was "tedious" which is another way of saying that she had no significant problems from it. She would get treated after going to work then would go home and rests. She had hyperpigmentation but no desquamation. She is  still using lotions appropriately. She said learning to do the "breathing" was the most difficult  part. Aside from that it detailed review of systems today was noncontributory  PAST MEDICAL HISTORY: Past Medical History:  Diagnosis Date  . Breast cancer of upper-outer quadrant of left female breast (HCC) 07/13/2015  . Heart murmur    "nothing to worry about"  . Hypertension     PAST SURGICAL HISTORY: Past Surgical History:  Procedure Laterality Date  . BREAST LUMPECTOMY Left 12/01/2015   LEFT BREAST SEED GUIDED LUMPECTOMY WITH LEFT AXILLARY NODE DISSECTION (Left)  . CESAREAN SECTION  1998  . COLONOSCOPY W/ POLYPECTOMY    . DILATION AND EVACUATION  05/01/2011   Procedure: DILATATION AND EVACUATION;  Surgeon: Emma [name];  Location: [facility];  Service: Gynecology;  Laterality: N/A;  . FOOT SURGERY Bilateral 1993   bunionectomy  . PORT-A-CATH REMOVAL  12/01/2015  . PORT-A-CATH REMOVAL Right 12/01/2015   Procedure: REMOVAL PORT-A-CATH;  Surgeon: Emma Kin, MD;  Location: [facility] OR;  Service: General;  Laterality: Right;  . PORTACATH PLACEMENT Right 07/31/2015   Procedure: INSERTION PORT-A-CATH;  Surgeon: Emma Kin, MD;  Location: [facility];  Service: General;  Laterality: Right;  . RADIOACTIVE SEED GUIDED PARTIAL MASTECTOMY/AXILLARY SENTINEL NODE BIOPSY/AXILLARY NODE DISSECTION Left 12/01/2015   Procedure: LEFT BREAST SEED GUIDED LUMPECTOMY WITH LEFT AXILLARY NODE DISSECTION;  Surgeon: Emma Kin, MD;  Location: [facility] OR;  Service: General;  Laterality: Left;    FAMILY HISTORY Family History  Problem Relation Age of Onset  . Colon cancer Mother   The patient's father died at age 39 from "complicated causes". The patient's mother died from colon cancer at the age of 38. He cancer was diagnosed the year prior. The patient has 3 brothers, 11 sisters. There is no history of breast or ovarian cancer and no other colon cancers in the family  GYNECOLOGIC HISTORY:  No LMP recorded. Menarche age 50, first live birth age 50, which the patient understands increases the risk  of breast cancer. She is GX P2. She still having regular periods as of March 2017, but periods stopped during chemotherapy. So far there have not resumed. She used birth control remotely with no complications.  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 Her husband Emma [name] works for a Midwife is Chiropodist of environmental services. Emma [name] has a child from a prior marriage, Ziyona Gagel, who is in the [employer]. Lakita as a child from a prior marriage, [employer], who is going to school in [place]. The couple have a son, [name], 45 years old, at home. The patient has one grandchild. She goes to a local [employer].    ADVANCED DIRECTIVES: no   HEALTH MAINTENANCE: Social History  Substance Use Topics  . Smoking status: Never Smoker  . Smokeless tobacco: Never Used  . Alcohol use No     Comment: social     Colonoscopy: January 2016/[facility]  PAP: February 2017  Bone density: Never  Lipid panel:  No Known Allergies  Current Outpatient Prescriptions  Medication Sig Dispense Refill  . amLODipine (NORVASC) 10 MG tablet Take 10 mg by mouth daily.  0  . ferrous sulfate 325 (65 FE) MG EC tablet Take 325 mg by mouth daily with breakfast.    . HYDROcodone-acetaminophen (NORCO/VICODIN) 5-325 MG tablet Take 1-2 tablets by mouth every 6 (six) hours as needed for moderate pain. (Patient not taking:  Reported on 12/21/2015) 30 tablet 0  . tamoxifen (NOLVADEX) 20 MG tablet Take 1 tablet (20 mg total) by mouth daily. 90 tablet 12   No current facility-administered medications for this visit.     OBJECTIVE: Middle-aged African-American woman Who appears well There were no vitals filed for this visit.   There is no height or weight on file to calculate BMI.    ECOG FS:0 - Asymptomatic  Sclerae unicteric, pupils round and equal Oropharynx clear and moist-- no thrush or other lesions No cervical  or supraclavicular adenopathy Lungs no rales or rhonchi Heart regular rate and rhythm Abd soft, nontender, positive bowel sounds MSK no focal spinal tenderness, no upper extremity lymphedema Neuro: nonfocal, well oriented, appropriate affect Breasts: The right breast is unremarkable. The left breast is status post lumpectomy and recent radiation. There is significant hyperpigmentation, but no desquamation. There is no tenderness to palpation. There is no evidence of local recurrence. The left axilla is benign    LAB RESULTS:  CMP     Component Value Date/Time   NA 143 03/14/2016 1317   K 3.7 03/14/2016 1317   CL 109 11/24/2015 1449   CO2 27 03/14/2016 1317   GLUCOSE 97 03/14/2016 1317   BUN 11.1 03/14/2016 1317   CREATININE 0.8 03/14/2016 1317   CALCIUM 9.2 03/14/2016 1317   PROT 7.6 03/14/2016 1317   ALBUMIN 3.4 (L) 03/14/2016 1317   AST 14 03/14/2016 1317   ALT 9 03/14/2016 1317   ALKPHOS 76 03/14/2016 1317   BILITOT 0.46 03/14/2016 1317   GFRNONAA >60 11/24/2015 1449   GFRAA >60 11/24/2015 1449    INo results found for: SPEP, UPEP  Lab Results  Component Value Date   WBC 4.8 03/14/2016   NEUTROABS 3.4 03/14/2016   HGB 12.4 03/14/2016   HCT 38.9 03/14/2016   MCV 78.5 (L) 03/14/2016   PLT 216 03/14/2016      Chemistry      Component Value Date/Time   NA 143 03/14/2016 1317   K 3.7 03/14/2016 1317   CL 109 11/24/2015 1449   CO2 27 03/14/2016 1317   BUN 11.1 03/14/2016 1317   CREATININE 0.8 03/14/2016 1317      Component Value Date/Time   CALCIUM 9.2 03/14/2016 1317   ALKPHOS 76 03/14/2016 1317   AST 14 03/14/2016 1317   ALT 9 03/14/2016 1317   BILITOT 0.46 03/14/2016 1317      No results found for: LABCA2  No components found for: LABCA125  No results for input(s): INR in the last 168 hours.  Urinalysis No results found for: COLORURINE, APPEARANCEUR, LABSPEC, PHURINE, GLUCOSEU, HGBUR, BILIRUBINUR, KETONESUR, PROTEINUR, UROBILINOGEN, NITRITE,  LEUKOCYTESUR   ELIGIBLE FOR AVAILABLE RESEARCH PROTOCOL: PALLAS  STUDIES: No results found.  ASSESSMENT: 50 y.o. [facility] woman status post left breast biopsy 07/10/2015 of 2 separate masses as well as left axillary lymph node, all positive for invasive lobular carcinoma, grade 1, estrogen and progesterone receptor positive, HER-2 not amplified, with an MIB-1 between 5 and 20%  (1) neoadjuvan chemotherapy consisting of cyclophosphamide and docetaxel every 3 weeks 4, First dose 08/07/2015, final dose 10/12/2015  (2) status post left lumpectomy and axillary lymph node dissection 12/01/2015 for a residual pT2 pN1, stage IIB invasive lobular carcinoma, repeat prognostic panel again estrogen and progesterone receptor positive, HER-2 not amplified. Margins were close but negative   (3) adjuvant radiation at [facility], completed 03/07/2016 ----------------------------------------------------------------------------------------------------- LT breast/IMN, PWT 5000cGy 35 200cGy 9/14 - 02/28/16 ----------------------------------------------------------------------------------------------------- LT SCV/AX,  RAO.LPO 5000cGy 35 200cGy 9/14 - 02/28/16 ----------------------------------------------------------------------------------------------------- LT TB boost, bouquet 1200cGy 8 200cGy 10/19 - 03/07/16  ----------------------------------------------------------------------------------------------------- Totals 6200cGy   (4) to start tamoxifen 03/25/2016.  (5) consider PALLAS trial--anticipate enrolling in January  PLAN: Tanee has done remarkably well with her treatment so far. She is now ready to start anti-estrogens.  Because she was still premenopausal at the time of starting chemotherapy, we are going to go with tamoxifen. We discussed the possible toxicities, side effects and complications of this agent in detail. We also discussed the likely benefits. She is eager to get started,  but I encouraged her to take a slightly longer "medication" from treatment so the starting date for tamoxifen will be 03/25/2016.  The main concern I have with her is that there was a significant amount of residual disease after standard neoadjuvant chemotherapy. I would be interested in her receiving palbociclib and strongly encouraged her to participate in the PALLAS trial. Our research nurse met with her today and gave her the consent form to read over. We will go over it again when Velna Hatchet returns to see me 04/15/2016.  She knows to call for any problems that may develop before that visit. Emma Dell, MD   03/25/2016 9:26 AM

## 2016-04-12 ENCOUNTER — Other Ambulatory Visit: Payer: Self-pay | Admitting: *Deleted

## 2016-04-12 DIAGNOSIS — Z170 Estrogen receptor positive status [ER+]: Principal | ICD-10-CM

## 2016-04-12 DIAGNOSIS — C50412 Malignant neoplasm of upper-outer quadrant of left female breast: Secondary | ICD-10-CM

## 2016-04-15 ENCOUNTER — Other Ambulatory Visit: Payer: BC Managed Care – PPO

## 2016-04-15 ENCOUNTER — Ambulatory Visit (HOSPITAL_BASED_OUTPATIENT_CLINIC_OR_DEPARTMENT_OTHER): Payer: BC Managed Care – PPO | Admitting: Oncology

## 2016-04-15 ENCOUNTER — Other Ambulatory Visit (HOSPITAL_BASED_OUTPATIENT_CLINIC_OR_DEPARTMENT_OTHER): Payer: BC Managed Care – PPO

## 2016-04-15 ENCOUNTER — Encounter: Payer: BC Managed Care – PPO | Admitting: *Deleted

## 2016-04-15 ENCOUNTER — Other Ambulatory Visit (HOSPITAL_COMMUNITY)
Admission: RE | Admit: 2016-04-15 | Discharge: 2016-04-15 | Disposition: A | Discharge status: Home / Self Care | Payer: BC Managed Care – PPO | Source: Ambulatory Visit | Attending: Oncology | Admitting: Oncology

## 2016-04-15 VITALS — BP 129/67 | HR 71 | Temp 98.6°F | Resp 18 | Ht 66.0 in | Wt 207.2 lb

## 2016-04-15 DIAGNOSIS — C773 Secondary and unspecified malignant neoplasm of axilla and upper limb lymph nodes: Secondary | ICD-10-CM

## 2016-04-15 DIAGNOSIS — Z170 Estrogen receptor positive status [ER+]: Principal | ICD-10-CM

## 2016-04-15 DIAGNOSIS — C50412 Malignant neoplasm of upper-outer quadrant of left female breast: Secondary | ICD-10-CM

## 2016-04-15 LAB — CBC WITH DIFFERENTIAL/PLATELET
BASO%: 0.2 % (ref 0.0–2.0)
Basophils Absolute: 0 10*3/uL (ref 0.0–0.1)
EOS ABS: 0 10*3/uL (ref 0.0–0.5)
EOS%: 0.7 % (ref 0.0–7.0)
HCT: 38.9 % (ref 34.8–46.6)
HEMOGLOBIN: 12.5 g/dL (ref 11.6–15.9)
LYMPH#: 1.9 10*3/uL (ref 0.9–3.3)
LYMPH%: 30.3 % (ref 14.0–49.7)
MCH: 25.9 pg (ref 25.1–34.0)
MCHC: 32.1 g/dL (ref 31.5–36.0)
MCV: 80.7 fL (ref 79.5–101.0)
MONO#: 0.4 10*3/uL (ref 0.1–0.9)
MONO%: 6.2 % (ref 0.0–14.0)
NEUT%: 62.6 % (ref 38.4–76.8)
NEUTROS ABS: 3.8 10*3/uL (ref 1.5–6.5)
PLATELETS: 210 10*3/uL (ref 145–400)
RBC: 4.82 10*6/uL (ref 3.70–5.45)
RDW: 16.3 % — AB (ref 11.2–14.5)
WBC: 6.1 10*3/uL (ref 3.9–10.3)

## 2016-04-15 LAB — COMPREHENSIVE METABOLIC PANEL
ALBUMIN: 3.6 g/dL (ref 3.5–5.0)
ALK PHOS: 71 U/L (ref 40–150)
ALT: 11 U/L (ref 0–55)
ANION GAP: 8 meq/L (ref 3–11)
AST: 19 U/L (ref 5–34)
BILIRUBIN TOTAL: 0.41 mg/dL (ref 0.20–1.20)
BUN: 12.3 mg/dL (ref 7.0–26.0)
CO2: 25 meq/L (ref 22–29)
CREATININE: 0.8 mg/dL (ref 0.6–1.1)
Calcium: 9.5 mg/dL (ref 8.4–10.4)
Chloride: 110 mEq/L — ABNORMAL HIGH (ref 98–109)
EGFR: >90 mL/min/{1.73_m2} (ref >90)
GLUCOSE: 87 mg/dL (ref 70–140)
Potassium: 4.4 mEq/L (ref 3.5–5.1)
SODIUM: 143 meq/L (ref 136–145)
TOTAL PROTEIN: 7.9 g/dL (ref 6.4–8.3)

## 2016-04-15 LAB — RESEARCH LABS

## 2016-04-15 LAB — HCG, SERUM, QUALITATIVE: Preg, Serum: NEGATIVE

## 2016-04-15 MED ORDER — TAMOXIFEN CITRATE 20 MG PO TABS: 20.0000 mg | ORAL_TABLET | Freq: Every day | ORAL | 12 refills | Status: AC

## 2016-04-15 NOTE — Progress Notes (Signed)
[facility]  [phone] [phone]   ID: Nicole Cella DOB: 11-07-1965  MR#: [id number]  IHK#:[id number]  Patient Care Team: [name], MD as PCP - General (Family Medicine) [name], MD as Consulting Physician (General Surgery) Lowella Dell, MD as Consulting Physician (Oncology) Antony Blackbird, MD as Consulting Physician (Radiation Oncology) Levi Aland, MD as Consulting Physician (Obstetrics and Gynecology) Lyanne Co, MD as Referring Physician (Radiology) Cira Rue, RN as Registered Nurse PCP: [name], MD OTHER MD:  CHIEF COMPLAINT: Estrogen receptor positive breast cancer  CURRENT TREATMENT:  Tamoxifen; considering PALLAS trial  BREAST CANCER HISTORY: From the original intake note:  Loann had routine screening mammography at Dr. Ewell Poe office/[facility] 06/23/2015. There was an area of distortion in the left breast. The patient was then referred to the [facility] where on 07/03/2015 he underwent left diagnostic mammography with tomosynthesis and left breast ultrasonography. The breast density was category B. In the far outer left breast there was an area of distortion measuring approximately 4 cm. There appeared to be a prominent left axillary lymph node. On physical exam at the 2:00 position of the left breast 12 cm from the nipple there was an area of approximately 4 cm of firmness. There was no palpable mass in the left axilla. Ultrasonography confirmed an irregular hypoechoic mass in the upper-outer quadrant of the left breast measuring 2.6 cm. A little closer to the axilla there was a second irregular hypoechoic mass measuring 0.5 cm. The distance between these 2 masses was 3.6 cm. In addition there was a right axillary lymph node with a thickened cortex. It measured 0.9 cm.  On 5 or 20 11/30/2015 the patient underwent biopsy of the 2 areas in the breast as well as the axillary lymph node. The  larger of the 2 masses was an invasive lobular carcinoma, E-cadherin negative, estrogen receptor 100% positive, progesterone receptor 100% positive, both with strong staining intensity, with an MIB-1 of 20%. This second, smaller mass, was also an invasive lobular carcinoma, E-cadherin negative, estrogen receptor 90% positive, progesterone receptor 100% positive, both with strong staining intensity, with an MIB-1 of 5%. The right axillary lymph node was also an invasive mammary carcinoma. And also estrogen receptor positive at 70%, progesterone receptor positive at 90%, with an MIB-1 of 10%.  Her subsequent history is as detailed below  INTERVAL HISTORY: Sajada returns for follow up of her estrogen receptor positive breast cancer. Her research nurse Vicie Mutters was also present at today's visit.  Velna Hatchet started tamoxifen approximately a month ago. She has not experienced any unusual or troublesome hot flashes. She has had no problems with vaginal dryness or witness. She has also not had a period for the past several months.  Today she reviewed the PALLAS protocol in detail and signed consent. We hope to complete enrollment this week and randomize her next week.  REVIEW OF SYSTEMS: Reene has no peripheral neuropathy, altered taste, or other residual side effects from her chemotherapy. She has no pain discharge or erythema in the irradiated breast. There is no desquamation. She has had no surgical complications and particularly no dehiscence, erythema, swelling, or unusual pain. The cosmetic result is good. She has no uncontrolled or actually any intercurrent illness and she is certainly able to comply with all study requirements. I'm not planning any further scans. A pregnancy test will be obtained today to document the fact that she is not pregnant  PAST MEDICAL HISTORY: Past Medical  History:  Diagnosis Date  . Breast cancer of upper-outer quadrant of left female breast (HCC) 07/13/2015  . Heart  murmur    "nothing to worry about"  . Hypertension     PAST SURGICAL HISTORY: Past Surgical History:  Procedure Laterality Date  . BREAST LUMPECTOMY Left 12/01/2015   LEFT BREAST SEED GUIDED LUMPECTOMY WITH LEFT AXILLARY NODE DISSECTION (Left)  . CESAREAN SECTION  1998  . COLONOSCOPY W/ POLYPECTOMY    . DILATION AND EVACUATION  05/01/2011   Procedure: DILATATION AND EVACUATION;  Surgeon: Levi Aland;  Location: [facility];  Service: Gynecology;  Laterality: N/A;  . FOOT SURGERY Bilateral 1993   bunionectomy  . PORT-A-CATH REMOVAL  12/01/2015  . PORT-A-CATH REMOVAL Right 12/01/2015   Procedure: REMOVAL PORT-A-CATH;  Surgeon: [name], MD;  Location: [facility];  Service: General;  Laterality: Right;  . PORTACATH PLACEMENT Right 07/31/2015   Procedure: INSERTION PORT-A-CATH;  Surgeon: [name], MD;  Location: [facility];  Service: General;  Laterality: Right;  . RADIOACTIVE SEED GUIDED PARTIAL MASTECTOMY/AXILLARY SENTINEL NODE BIOPSY/AXILLARY NODE DISSECTION Left 12/01/2015   Procedure: LEFT BREAST SEED GUIDED LUMPECTOMY WITH LEFT AXILLARY NODE DISSECTION;  Surgeon: [name], MD;  Location: [facility] OR;  Service: General;  Laterality: Left;    FAMILY HISTORY Family History  Problem Relation Age of Onset  . Colon cancer Mother   The patient's father died at age 27 from "complicated causes". The patient's mother died from colon cancer at the age of 3. He cancer was diagnosed the year prior. The patient has 3 brothers, 11 sisters. There is no history of breast or ovarian cancer and no other colon cancers in the family  GYNECOLOGIC HISTORY:  No LMP recorded. Menarche age 63, first live birth age 44, which the patient understands increases the risk of breast cancer. She is GX P2. She Was still having regular periods as of March 2017, but periods stopped during chemotherapy. So far there have not resumed. She used birth control remotely with no complications. Current birth  control and include condom with spermicide, with a diaphragm to be added.  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 Her husband [name] works for a Midwife is Chiropodist of environmental services. [name] has a child from a prior marriage, Ladetra Torrens, who is in the [employer]. Xylina as a child from a prior marriage, Shepard [employer], who is going to school in [place]. The couple have a son, [name], 68 years old, at home. The patient has one grandchild. She goes to a local [employer].    ADVANCED DIRECTIVES: no   HEALTH MAINTENANCE: Social History  Substance Use Topics  . Smoking status: Never Smoker  . Smokeless tobacco: Never Used  . Alcohol use No     Comment: social     Colonoscopy: January 2016/[facility]  PAP: February 2017  Bone density: Never  Lipid panel:  No Known Allergies  Current Outpatient Prescriptions  Medication Sig Dispense Refill  . amLODipine (NORVASC) 10 MG tablet Take 10 mg by mouth daily.  0  . ferrous sulfate 325 (65 FE) MG EC tablet Take 325 mg by mouth daily with breakfast.    . tamoxifen (NOLVADEX) 20 MG tablet Take 1 tablet (20 mg total) by mouth daily. 90 tablet 12   No current facility-administered medications for this visit.     OBJECTIVE: Middle-aged African-American woman In no acute distress Vitals:  04/15/16 1341  BP: 129/67  Pulse: 71  Resp: 18  Temp: 98.6 F (37 C)     Body mass index is 33.44 kg/m.    ECOG FS:0 - Asymptomatic  Sclerae unicteric, pupils round and equal Oropharynx clear and moist-- no thrush or other lesions No cervical or supraclavicular adenopathy Lungs no rales or rhonchi Heart regular rate and rhythm, benign I/VI SEM as previously noted Abd soft, nontender, positive bowel sounds MSK no focal spinal tenderness, no upper extremity lymphedema Neuro: nonfocal, well oriented, appropriate affect Breasts: The  right breast is benign. The left breast is status post lumpectomy and radiation. There are the expected changes associated with scar tissue and mild skin thickening as well as slight hyperpigmentation but no erythema. There is no evidence of local recurrence. Left axilla is benign.   LAB RESULTS:  CMP     Component Value Date/Time   NA 143 04/15/2016 1452   K 4.4 04/15/2016 1452   CL 109 11/24/2015 1449   CO2 25 04/15/2016 1452   GLUCOSE 87 04/15/2016 1452   BUN 12.3 04/15/2016 1452   CREATININE 0.8 04/15/2016 1452   CALCIUM 9.5 04/15/2016 1452   PROT 7.9 04/15/2016 1452   ALBUMIN 3.6 04/15/2016 1452   AST 19 04/15/2016 1452   ALT 11 04/15/2016 1452   ALKPHOS 71 04/15/2016 1452   BILITOT 0.41 04/15/2016 1452   GFRNONAA >60 11/24/2015 1449   GFRAA >60 11/24/2015 1449    INo results found for: SPEP, UPEP  Lab Results  Component Value Date   WBC 6.1 04/15/2016   NEUTROABS 3.8 04/15/2016   HGB 12.5 04/15/2016   HCT 38.9 04/15/2016   MCV 80.7 04/15/2016   PLT 210 04/15/2016      Chemistry      Component Value Date/Time   NA 143 04/15/2016 1452   K 4.4 04/15/2016 1452   CL 109 11/24/2015 1449   CO2 25 04/15/2016 1452   BUN 12.3 04/15/2016 1452   CREATININE 0.8 04/15/2016 1452      Component Value Date/Time   CALCIUM 9.5 04/15/2016 1452   ALKPHOS 71 04/15/2016 1452   AST 19 04/15/2016 1452   ALT 11 04/15/2016 1452   BILITOT 0.41 04/15/2016 1452      No results found for: LABCA2  No components found for: LABCA125  No results for input(s): INR in the last 168 hours.  Urinalysis No results found for: COLORURINE, APPEARANCEUR, LABSPEC, PHURINE, GLUCOSEU, HGBUR, BILIRUBINUR, KETONESUR, PROTEINUR, UROBILINOGEN, NITRITE, LEUKOCYTESUR   ELIGIBLE FOR AVAILABLE RESEARCH PROTOCOL: PALLAS  STUDIES: No results found.  ASSESSMENT: 50 y.o. [facility] woman status post left breastUpper outer quadrant biopsy 07/10/2015 of 2 separate masses as well as left  axillary lymph node, all positive for invasive lobular carcinoma, grade 1, estrogen and progesterone receptor positive, HER-2 not amplified, with an MIB-1 between 5 and 20%  (1) neoadjuvan chemotherapy consisting of cyclophosphamide and docetaxel every 3 weeks 4, First dose 08/07/2015, final dose 10/12/2015  (2) status post left lumpectomy and axillary lymph node dissection 12/01/2015 for a residual pT2 pN1, stage IIA invasive lobular carcinoma, repeat prognostic panel again estrogen and progesterone receptor positive, HER-2 not amplified. Margins were close but negative   (3) adjuvant radiation at [facility], completed 03/07/2016 ----------------------------------------------------------------------------------------------------- LT breast/IMN, PWT 5000cGy 35 200cGy 9/14 - 02/28/16 ----------------------------------------------------------------------------------------------------- LT SCV/AX, RAO.LPO 5000cGy 35 200cGy 9/14 - 02/28/16 ----------------------------------------------------------------------------------------------------- LT TB boost, bouquet 1200cGy 8 200cGy 10/19 - 03/07/16  ----------------------------------------------------------------------------------------------------- Totals 6200cGy   (4) started tamoxifen 03/25/2016.  (  5) signed consent for the PALLAS trial ([facility] AFT-05: randomized to Ibrance vs no Ibrance) 04/15/2016  PLAN: Kathiria is tolerating the tamoxifen well and the plan will be to continue that at least 2 years after which she may qualify for switching to an aromatase inhibitor.  She signed consent to participate in the PALLAS trial today. The one hang up is that further trials definition she is still premenopausal.  The study requires 2 forms of contraception. One of them she is already on, namely condoms. The other one may be a diaphragm or cervical. We contacted her gynecologist Dr. Dareen Piano today and he tells me she will be seen sometime this week at his  office to get this accomplished.  We will also get periodic pregnancy test as required by the study until she has a full year out from her last menstrual period, at which time probably we can reclassify her as postmenopausal  I'm hoping she can be randomized next week at which time we will now she will receive palbociclib or not  She knows to call for any other problems that may develop before her next visit here.        Lowella Dell, MD   04/15/2016 6:39 PM

## 2016-04-16 ENCOUNTER — Encounter: Payer: Self-pay | Admitting: *Deleted

## 2016-04-16 ENCOUNTER — Other Ambulatory Visit: Payer: Self-pay | Admitting: Oncology

## 2016-04-16 LAB — HEMOGLOBIN A1C
ESTIMATED AVERAGE GLUCOSE: 117 mg/dL
Hemoglobin A1c: 5.7 % — ABNORMAL HIGH (ref 4.8–5.6)

## 2016-04-16 NOTE — Progress Notes (Unsigned)
[facility]  [phone] [phone]) [phone]   ID: Nicole Cella DOB: April 02, 1966  MR#: [id number]  LOV#:[id number]  Patient Care Team: [name], MD as PCP - General (Family Medicine) [name], MD as Consulting Physician (General Surgery) Lowella Dell, MD as Consulting Physician (Oncology) Antony Blackbird, MD as Consulting Physician (Radiation Oncology) Levi Aland, MD as Consulting Physician (Obstetrics and Gynecology) Lyanne Co, MD as Referring Physician (Radiology) Cira Rue, RN as Registered Nurse PCP: [name], MD OTHER MD:  CHIEF COMPLAINT: Estrogen receptor positive breast cancer  CURRENT TREATMENT:  Tamoxifen; considering PALLAS trial  BREAST CANCER HISTORY: From the original intake note:  Juliani had routine screening mammography at Dr. Ewell Poe office/[facility] 06/23/2015. There was an area of distortion in the left breast. The patient was then referred to the Breast Center where on 07/03/2015 he underwent left diagnostic mammography with tomosynthesis and left breast ultrasonography. The breast density was category B. In the far outer left breast there was an area of distortion measuring approximately 4 cm. There appeared to be a prominent left axillary lymph node. On physical exam at the 2:00 position of the left breast 12 cm from the nipple there was an area of approximately 4 cm of firmness. There was no palpable mass in the left axilla. Ultrasonography confirmed an irregular hypoechoic mass in the upper-outer quadrant of the left breast measuring 2.6 cm. A little closer to the axilla there was a second irregular hypoechoic mass measuring 0.5 cm. The distance between these 2 masses was 3.6 cm. In addition there was a right axillary lymph node with a thickened cortex. It measured 0.9 cm.  On 5 or 20 11/30/2015 the patient underwent biopsy of the 2 areas in the breast as well as the axillary lymph node. The  larger of the 2 masses was an invasive lobular carcinoma, E-cadherin negative, estrogen receptor 100% positive, progesterone receptor 100% positive, both with strong staining intensity, with an MIB-1 of 20%. This second, smaller mass, was also an invasive lobular carcinoma, E-cadherin negative, estrogen receptor 90% positive, progesterone receptor 100% positive, both with strong staining intensity, with an MIB-1 of 5%. The right axillary lymph node was also an invasive mammary carcinoma. And also estrogen receptor positive at 70%, progesterone receptor positive at 90%, with an MIB-1 of 10%.  Her subsequent history is as detailed below  INTERVAL HISTORY: Thelmer returns for follow up of her estrogen receptor positive breast cancer. Her research nurse Vicie Mutters was also present at today's visit.  Velna Hatchet started tamoxifen approximately a month ago. She has not experienced any unusual or troublesome hot flashes. She has had no problems with vaginal dryness or witness. She has also not had a period for the past several months.  Today she reviewed the PALLAS protocol in detail and signed consent. We hope to complete enrollment this week and randomize her next week.  REVIEW OF SYSTEMS: Aldyn has no peripheral neuropathy, altered taste, or other residual side effects from her chemotherapy. She has no pain discharge or erythema in the irradiated breast. There is no desquamation. She has had no surgical complications and particularly no dehiscence, erythema, swelling, or unusual pain. The cosmetic result is good. She has no uncontrolled or actually any intercurrent illness and she is certainly able to comply with all study requirements. I'm not planning any further scans. A pregnancy test will be obtained today to document the fact that she is not pregnant  PAST MEDICAL HISTORY: Past Medical  History:  Diagnosis Date  . Breast cancer of upper-outer quadrant of left female breast (HCC) 07/13/2015  . Heart  murmur    "nothing to worry about"  . Hypertension     PAST SURGICAL HISTORY: Past Surgical History:  Procedure Laterality Date  . BREAST LUMPECTOMY Left 12/01/2015   LEFT BREAST SEED GUIDED LUMPECTOMY WITH LEFT AXILLARY NODE DISSECTION (Left)  . CESAREAN SECTION  1998  . COLONOSCOPY W/ POLYPECTOMY    . DILATION AND EVACUATION  05/01/2011   Procedure: DILATATION AND EVACUATION;  Surgeon: Levi Aland;  Location: [facility];  Service: Gynecology;  Laterality: N/A;  . FOOT SURGERY Bilateral 1993   bunionectomy  . PORT-A-CATH REMOVAL  12/01/2015  . PORT-A-CATH REMOVAL Right 12/01/2015   Procedure: REMOVAL PORT-A-CATH;  Surgeon: [name], MD;  Location: [facility];  Service: General;  Laterality: Right;  . PORTACATH PLACEMENT Right 07/31/2015   Procedure: INSERTION PORT-A-CATH;  Surgeon: [name], MD;  Location:  SURGERY CENTER;  Service: General;  Laterality: Right;  . RADIOACTIVE SEED GUIDED PARTIAL MASTECTOMY/AXILLARY SENTINEL NODE BIOPSY/AXILLARY NODE DISSECTION Left 12/01/2015   Procedure: LEFT BREAST SEED GUIDED LUMPECTOMY WITH LEFT AXILLARY NODE DISSECTION;  Surgeon: [name], MD;  Location: [facility] OR;  Service: General;  Laterality: Left;    FAMILY HISTORY Family History  Problem Relation Age of Onset  . Colon cancer Mother   The patient's father died at age 47 from "complicated causes". The patient's mother died from colon cancer at the age of 48. He cancer was diagnosed the year prior. The patient has 3 brothers, 11 sisters. There is no history of breast or ovarian cancer and no other colon cancers in the family  GYNECOLOGIC HISTORY:  No LMP recorded. Menarche age 42, first live birth age 40, which the patient understands increases the risk of breast cancer. She is GX P2. She Was still having regular periods as of March 2017, but periods stopped during chemotherapy. So far there have not resumed. She used birth control remotely with no complications. Current birth  control and include condom with spermicide, with a diaphragm to be added.  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 Her husband [name] works for a Midwife is Chiropodist of environmental services. [name] has a child from a prior marriage, Analysia Amar, who is in the [employer]. Uniqua as a child from a prior marriage, [employer]pard [employer], who is going to school in [place]. The couple have a son, [name], 102 years old, at home. The patient has one grandchild. She goes to a local [employer].    ADVANCED DIRECTIVES: no   HEALTH MAINTENANCE: Social History  Substance Use Topics  . Smoking status: Never Smoker  . Smokeless tobacco: Never Used  . Alcohol use No     Comment: social     Colonoscopy: January 2016/[facility]  PAP: February 2017  Bone density: Never  Lipid panel:  No Known Allergies  Current Outpatient Prescriptions  Medication Sig Dispense Refill  . amLODipine (NORVASC) 10 MG tablet Take 10 mg by mouth daily.  0  . ferrous sulfate 325 (65 FE) MG EC tablet Take 325 mg by mouth daily with breakfast.    . tamoxifen (NOLVADEX) 20 MG tablet Take 1 tablet (20 mg total) by mouth daily. 90 tablet 12   No current facility-administered medications for this visit.     OBJECTIVE: Middle-aged African-American woman In no acute distress There were  no vitals filed for this visit.   There is no height or weight on file to calculate BMI.    ECOG FS:0 - Asymptomatic  Sclerae unicteric, pupils round and equal Oropharynx clear and moist-- no thrush or other lesions No cervical or supraclavicular adenopathy Lungs no rales or rhonchi Heart regular rate and rhythm, benign I/VI SEM as previously noted Abd soft, nontender, positive bowel sounds MSK no focal spinal tenderness, no upper extremity lymphedema Neuro: nonfocal, well oriented, appropriate affect Breasts: The right breast is benign.  The left breast is status post lumpectomy and radiation. There are the expected changes associated with scar tissue and mild skin thickening as well as slight hyperpigmentation but no erythema. There is no evidence of local recurrence. Left axilla is benign.   LAB RESULTS:  CMP     Component Value Date/Time   NA 143 04/15/2016 1452   K 4.4 04/15/2016 1452   CL 109 11/24/2015 1449   CO2 25 04/15/2016 1452   GLUCOSE 87 04/15/2016 1452   BUN 12.3 04/15/2016 1452   CREATININE 0.8 04/15/2016 1452   CALCIUM 9.5 04/15/2016 1452   PROT 7.9 04/15/2016 1452   ALBUMIN 3.6 04/15/2016 1452   AST 19 04/15/2016 1452   ALT 11 04/15/2016 1452   ALKPHOS 71 04/15/2016 1452   BILITOT 0.41 04/15/2016 1452   GFRNONAA >60 11/24/2015 1449   GFRAA >60 11/24/2015 1449    INo results found for: SPEP, UPEP  Lab Results  Component Value Date   WBC 6.1 04/15/2016   NEUTROABS 3.8 04/15/2016   HGB 12.5 04/15/2016   HCT 38.9 04/15/2016   MCV 80.7 04/15/2016   PLT 210 04/15/2016      Chemistry      Component Value Date/Time   NA 143 04/15/2016 1452   K 4.4 04/15/2016 1452   CL 109 11/24/2015 1449   CO2 25 04/15/2016 1452   BUN 12.3 04/15/2016 1452   CREATININE 0.8 04/15/2016 1452      Component Value Date/Time   CALCIUM 9.5 04/15/2016 1452   ALKPHOS 71 04/15/2016 1452   AST 19 04/15/2016 1452   ALT 11 04/15/2016 1452   BILITOT 0.41 04/15/2016 1452      No results found for: LABCA2  No components found for: LABCA125  No results for input(s): INR in the last 168 hours.  Urinalysis No results found for: COLORURINE, APPEARANCEUR, LABSPEC, PHURINE, GLUCOSEU, HGBUR, BILIRUBINUR, KETONESUR, PROTEINUR, UROBILINOGEN, NITRITE, LEUKOCYTESUR   ELIGIBLE FOR AVAILABLE RESEARCH PROTOCOL: PALLAS  STUDIES: No results found.  ASSESSMENT: 50 y.o. [facility] woman status post left breastUpper outer quadrant biopsy 07/10/2015 of 2 separate masses as well as left axillary lymph node,  all positive for invasive lobular carcinoma, grade 1, estrogen and progesterone receptor positive, HER-2 not amplified, with an MIB-1 between 5 and 20%  (1) neoadjuvan chemotherapy consisting of cyclophosphamide and docetaxel every 3 weeks 4, First dose 08/07/2015, final dose 10/12/2015  (2) status post left lumpectomy and axillary lymph node dissection 12/01/2015 for a residual pT2 pN1, stage IIB invasive lobular carcinoma, repeat prognostic panel again estrogen and progesterone receptor positive, HER-2 not amplified. Margins were close but negative   (3) adjuvant radiation at [facility], completed 03/07/2016 ----------------------------------------------------------------------------------------------------- LT breast/IMN, PWT 5000cGy 35 200cGy 9/14 - 02/28/16 ----------------------------------------------------------------------------------------------------- LT SCV/AX, RAO.LPO 5000cGy 35 200cGy 9/14 - 02/28/16 ----------------------------------------------------------------------------------------------------- LT TB boost, bouquet 1200cGy 8 200cGy 10/19 - 03/07/16  ----------------------------------------------------------------------------------------------------- Totals 6200cGy   (4) started tamoxifen 03/25/2016.  (5) signed consent for the PALLAS trial (  [facility] AFT-05: randomized to Ibrance vs no Ibrance) 04/15/2016  PLAN: Marcedes is tolerating the tamoxifen well and the plan will be to continue that at least 2 years after which she may qualify for switching to an aromatase inhibitor.  She signed consent to participate in the PALLAS trial today. The one hang up is that further trials definition she is still premenopausal.  The study requires 2 forms of contraception. One of them she is already on, namely condoms. The other one may be a diaphragm or cervical. We contacted her gynecologist Dr. Dareen Piano today and he tells me she will be seen sometime this week at his office to get this  accomplished.  We will also get periodic pregnancy test as required by the study until she has a full year out from her last menstrual period, at which time probably we can reclassify her as postmenopausal  I'm hoping she can be randomized next week at which time we will now she will receive palbociclib or not  She knows to call for any other problems that may develop before her next visit here.        Lowella Dell, MD   04/16/2016 10:41 AM daily cleansed portion,

## 2016-04-18 ENCOUNTER — Encounter: Payer: Self-pay | Admitting: *Deleted

## 2016-04-18 NOTE — Progress Notes (Signed)
04/18/2016 1605 Eligibility and ineligibility reviewed and confirmed with Kenton Kingfisher, research manager except still waiting to hear from patient regarding her GYN visit and methods of contraception she will use.  This RN left a third voice message with patient at 1600 requesting a call back. Per instructions from [place], a referral to dietician for elevated HgbA1c was made. Marcellus Scott, RN, BSN, MHA, OCN

## 2016-04-19 ENCOUNTER — Other Ambulatory Visit: Payer: Self-pay | Admitting: Oncology

## 2016-04-19 NOTE — Progress Notes (Signed)
[facility]  [phone]) [phone] [phone]) [phone]   ID: Emma [name] DOB: [birth year]  MR#: [id number]  XNA#:[id number]  Patient Care Team: [name], MD as PCP - [name] (Family Medicine) [name], MD as Consulting Physician ([name] Surgery) Lowella Dell, MD as Consulting Physician (Oncology) Antony Blackbird, MD as Consulting Physician (Radiation Oncology) Levi Aland, MD as Consulting Physician (Obstetrics and Gynecology) Lyanne Co, MD as Referring Physician (Radiology) Cira Rue, RN as Registered Nurse PCP: [name], MD OTHER MD:  CHIEF COMPLAINT: Estrogen receptor positive breast cancer  CURRENT TREATMENT:  Tamoxifen; considering PALLAS trial  BREAST CANCER HISTORY: From the original intake note:  Emma [name] had routine screening mammography at Dr. Ewell Poe office/[facility] 06/23/2015. There was an area of distortion in the left breast. The patient was then referred to the Breast Center where on 07/03/2015 he underwent left diagnostic mammography with tomosynthesis and left breast ultrasonography. The breast density was category B. In the far outer left breast there was an area of distortion measuring approximately 4 cm. There appeared to be a prominent left axillary lymph node. On physical exam at the 2:00 position of the left breast 12 cm from the nipple there was an area of approximately 4 cm of firmness. There was no palpable mass in the left axilla. Ultrasonography confirmed an irregular hypoechoic mass in the upper-outer quadrant of the left breast measuring 2.6 cm. A little closer to the axilla there was a second irregular hypoechoic mass measuring 0.5 cm. The distance between these 2 masses was 3.6 cm. In addition there was a right axillary lymph node with a thickened cortex. It measured 0.9 cm.  On 5 or 20 11/30/2015 the patient underwent biopsy of the 2 areas in the breast as well as the axillary lymph node. The  larger of the 2 masses was an invasive lobular carcinoma, E-cadherin negative, estrogen receptor 100% positive, progesterone receptor 100% positive, both with strong staining intensity, with an MIB-1 of 20%. This second, smaller mass, was also an invasive lobular carcinoma, E-cadherin negative, estrogen receptor 90% positive, progesterone receptor 100% positive, both with strong staining intensity, with an MIB-1 of 5%. The right axillary lymph node was also an invasive mammary carcinoma. And also estrogen receptor positive at 70%, progesterone receptor positive at 90%, with an MIB-1 of 10%.  Her subsequent history is as detailed below  INTERVAL HISTORY: Emma [name] for follow up of her estrogen receptor positive breast cancer. Her research nurse Emma [name] was also present at today's visit.  Emma [name] started tamoxifen approximately a month ago. She has not experienced any unusual or troublesome hot flashes. She has had no problems with vaginal dryness or witness. She has also not had a period for the past several months.  Today she reviewed the PALLAS protocol in detail and signed consent. We hope to complete enrollment this week and randomize her next week.  REVIEW OF SYSTEMS: Emma [name] has no peripheral neuropathy, altered taste, or other residual side effects from her chemotherapy. She has no pain discharge or erythema in the irradiated breast. There is no desquamation. She has had no surgical complications and particularly no dehiscence, erythema, swelling, or unusual pain. The cosmetic result is good. She has no uncontrolled or actually any intercurrent illness and she is certainly able to comply with all study requirements. I'm not planning any further scans. A pregnancy test will be obtained today to document the fact that she is not pregnant  PAST MEDICAL HISTORY: Past Medical  History:  Diagnosis Date  . Breast cancer of upper-outer quadrant of left female breast (HCC) 07/13/2015  . Heart  murmur    "nothing to worry about"  . Hypertension     PAST SURGICAL HISTORY: Past Surgical History:  Procedure Laterality Date  . BREAST LUMPECTOMY Left 12/01/2015   LEFT BREAST SEED GUIDED LUMPECTOMY WITH LEFT AXILLARY NODE DISSECTION (Left)  . CESAREAN SECTION  1998  . COLONOSCOPY W/ POLYPECTOMY    . DILATION AND EVACUATION  05/01/2011   Procedure: DILATATION AND EVACUATION;  Surgeon: Levi Aland;  Location: [facility];  Service: Gynecology;  Laterality: N/A;  . FOOT SURGERY Bilateral 1993   bunionectomy  . PORT-A-CATH REMOVAL  12/01/2015  . PORT-A-CATH REMOVAL Right 12/01/2015   Procedure: REMOVAL PORT-A-CATH;  Surgeon: [name], MD;  Location: [facility] OR;  Service: [name];  Laterality: Right;  . PORTACATH PLACEMENT Right 07/31/2015   Procedure: INSERTION PORT-A-CATH;  Surgeon: [name], MD;  Location: [facility];  Service: [name];  Laterality: Right;  . RADIOACTIVE SEED GUIDED PARTIAL MASTECTOMY/AXILLARY SENTINEL NODE BIOPSY/AXILLARY NODE DISSECTION Left 12/01/2015   Procedure: LEFT BREAST SEED GUIDED LUMPECTOMY WITH LEFT AXILLARY NODE DISSECTION;  Surgeon: [name], MD;  Location: [facility] OR;  Service: [name];  Laterality: Left;    FAMILY HISTORY Family History  Problem Relation Age of Onset  . Colon cancer Mother   The patient's father died at age 19 from "complicated causes". The patient's mother died from colon cancer at the age of 53. He cancer was diagnosed the year prior. The patient has 3 brothers, 11 sisters. There is no history of breast or ovarian cancer and no other colon cancers in the family  GYNECOLOGIC HISTORY:  No LMP recorded. Menarche age 50, first live birth age 50, which the patient understands increases the risk of breast cancer. She is GX P2. She Was still having regular periods as of March 2017, but periods stopped during chemotherapy. So far there have not resumed. She used birth control remotely with no complications. Current birth  control and include condom with spermicide, with a diaphragm to be added.  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 Her husband [name] works for a Midwife is Chiropodist of environmental services. [name] has a child from a prior marriage, Emma [name], who is in the [employer]. Emma [name] as a child from a prior marriage, Emma [name], who is going to school in [place]. The couple have a son, Emma [name], 36 years old, at home. The patient has one grandchild. She goes to a local [employer].    ADVANCED DIRECTIVES: no   HEALTH MAINTENANCE: Social History  Substance Use Topics  . Smoking status: Never Smoker  . Smokeless tobacco: Never Used  . Alcohol use No     Comment: social     Colonoscopy: January 2016/[facility]  PAP: February 2017  Bone density: Never  Lipid panel:  No Known Allergies  Current Outpatient Prescriptions  Medication Sig Dispense Refill  . amLODipine (NORVASC) 10 MG tablet Take 10 mg by mouth daily.  0  . ferrous sulfate 325 (65 FE) MG EC tablet Take 325 mg by mouth daily with breakfast.    . tamoxifen (NOLVADEX) 20 MG tablet Take 1 tablet (20 mg total) by mouth daily. 90 tablet 12   No current facility-administered medications for this visit.     OBJECTIVE: Middle-aged African-American woman In no acute distress There were  no vitals filed for this visit.   There is no height or weight on file to calculate BMI.    ECOG FS:0 - Asymptomatic  Sclerae unicteric, pupils round and equal Oropharynx clear and moist-- no thrush or other lesions No cervical or supraclavicular adenopathy Lungs no rales or rhonchi Heart regular rate and rhythm, benign I/VI SEM as previously noted Abd soft, nontender, positive bowel sounds MSK no focal spinal tenderness, no upper extremity lymphedema Neuro: nonfocal, well oriented, appropriate affect Breasts: The right breast is benign.  The left breast is status post lumpectomy and radiation. There are no findings of concern after palpation of breast, chest wall, axilla, supra and infraclavicular regions.. There is no evidence of local recurrence.   LAB RESULTS:  CMP     Component Value Date/Time   NA 143 04/15/2016 1452   K 4.4 04/15/2016 1452   CL 109 11/24/2015 1449   CO2 25 04/15/2016 1452   GLUCOSE 87 04/15/2016 1452   BUN 12.3 04/15/2016 1452   CREATININE 0.8 04/15/2016 1452   CALCIUM 9.5 04/15/2016 1452   PROT 7.9 04/15/2016 1452   ALBUMIN 3.6 04/15/2016 1452   AST 19 04/15/2016 1452   ALT 11 04/15/2016 1452   ALKPHOS 71 04/15/2016 1452   BILITOT 0.41 04/15/2016 1452   GFRNONAA >60 11/24/2015 1449   GFRAA >60 11/24/2015 1449    INo results found for: SPEP, UPEP  Lab Results  Component Value Date   WBC 6.1 04/15/2016   NEUTROABS 3.8 04/15/2016   HGB 12.5 04/15/2016   HCT 38.9 04/15/2016   MCV 80.7 04/15/2016   PLT 210 04/15/2016      Chemistry      Component Value Date/Time   NA 143 04/15/2016 1452   K 4.4 04/15/2016 1452   CL 109 11/24/2015 1449   CO2 25 04/15/2016 1452   BUN 12.3 04/15/2016 1452   CREATININE 0.8 04/15/2016 1452      Component Value Date/Time   CALCIUM 9.5 04/15/2016 1452   ALKPHOS 71 04/15/2016 1452   AST 19 04/15/2016 1452   ALT 11 04/15/2016 1452   BILITOT 0.41 04/15/2016 1452      No results found for: LABCA2  No components found for: LABCA125  No results for input(s): INR in the last 168 hours.  Urinalysis No results found for: COLORURINE, APPEARANCEUR, LABSPEC, PHURINE, GLUCOSEU, HGBUR, BILIRUBINUR, KETONESUR, PROTEINUR, UROBILINOGEN, NITRITE, LEUKOCYTESUR   ELIGIBLE FOR AVAILABLE RESEARCH PROTOCOL: PALLAS  STUDIES: No results found.  ASSESSMENT: 50 y.o. [facility] woman status post left breastUpper outer quadrant biopsy 07/10/2015 of 2 separate masses as well as left axillary lymph node, all positive for invasive lobular carcinoma,  grade 1, estrogen and progesterone receptor positive, HER-2 not amplified, with an MIB-1 between 5 and 20%  (1) neoadjuvan chemotherapy consisting of cyclophosphamide and docetaxel every 3 weeks 4, First dose 08/07/2015, final dose 10/12/2015  (2) status post left lumpectomy and axillary lymph node dissection 12/01/2015 for a residual pT2 pN1, stage IIB invasive lobular carcinoma, repeat prognostic panel again estrogen and progesterone receptor positive, HER-2 not amplified. Margins were close but negative   (3) adjuvant radiation at [facility], completed 03/07/2016 ----------------------------------------------------------------------------------------------------- LT breast/IMN, PWT 5000cGy 35 200cGy 9/14 - 02/28/16 ----------------------------------------------------------------------------------------------------- LT SCV/AX, RAO.LPO 5000cGy 35 200cGy 9/14 - 02/28/16 ----------------------------------------------------------------------------------------------------- LT TB boost, bouquet 1200cGy 8 200cGy 10/19 - 03/07/16  ----------------------------------------------------------------------------------------------------- Totals 6200cGy   (4) started tamoxifen 03/25/2016.  (5) signed consent for the PALLAS trial ([facility] AFT-05: randomized to Ibrance vs no Ibrance)  04/15/2016  PLAN: Nori is tolerating the tamoxifen well and the plan will be to continue that at least 2 years after which she may qualify for switching to an aromatase inhibitor.  She signed consent to participate in the PALLAS trial today. The one hang up is that further trials definition she is still premenopausal.  The study requires 2 forms of contraception. One of them she is already on, namely condoms. The other one may be a diaphragm or cervical. We contacted her gynecologist Dr. Dareen Piano today and he tells me she will be seen sometime this week at his office to get this accomplished.  We will also get periodic  pregnancy test as required by the study until she has a full year out from her last menstrual period, at which time probably we can reclassify her as postmenopausal  I'm hoping she can be randomized next week at which time we will now she will receive palbociclib or not  She knows to call for any other problems that may develop before her next visit here.        Lowella Dell, MD   04/19/2016 7:43 AM daily cleansed portion,

## 2016-04-26 ENCOUNTER — Encounter (HOSPITAL_COMMUNITY): Payer: Self-pay

## 2016-04-26 NOTE — Progress Notes (Signed)
Nutrition Assessment   Reason for Assessment:   MD referral for elevated a1c.  ASSESSMENT:  50 year old female with left breast cancer.  Completed neoadjuvant chemotherapy in 10/2015 currently on tamoxifen.    Spoke with patient via phone as lives a distance away from clinic.  Patient reports good appetite at this time, eats wide variety of foods    Medications: Fe sulfate, tamoxifen  Labs: a1c 5.7 (high normal range 5.6) Serum glucose on 12/4 87.  Serum glucose reviewed and elevated glucose noted during chemotherapy and decadron given.  Anthropometrics:   Height: 66 inches Weight: 207 lb BMI: 33.5  Stable weight noted   NUTRITION DIAGNOSIS: Food and nutrition related knowledge deficit related to breast cancer diagnosis and slightly elevated blood glucose as evidenced by referral for education from MD    INTERVENTION:   Discussed plant-based diet including plant based proteins, whole grains, lowfat foods, and lean meats. Discussed limiting refined sugar.  Will mail patient handouts.  Patient verbalized understanding and teach back method used.      MONITORING, EVALUATION, GOAL: Patient will consume plant based diet low in refined carbohydrate to maintain normal blood glucose and overall health.   NEXT VISIT: as needed  Kaile Bixler B. Zenia Resides, [place], [place] (pager)

## 2016-06-20 ENCOUNTER — Encounter: Payer: Self-pay | Admitting: *Deleted

## 2016-06-27 ENCOUNTER — Other Ambulatory Visit: Payer: Self-pay | Admitting: Obstetrics and Gynecology

## 2016-06-28 LAB — CYTOLOGY - PAP

## 2016-07-10 ENCOUNTER — Other Ambulatory Visit: Payer: Self-pay | Admitting: Oncology

## 2016-07-26 ENCOUNTER — Encounter: Payer: Self-pay | Admitting: *Deleted

## 2016-08-01 ENCOUNTER — Telehealth: Payer: Self-pay | Admitting: Oncology

## 2016-08-01 NOTE — Telephone Encounter (Signed)
sw pt to confirm SCP appt in July per LOS °

## 2016-08-28 ENCOUNTER — Other Ambulatory Visit: Payer: Self-pay | Admitting: Surgery

## 2016-08-28 DIAGNOSIS — Z853 Personal history of malignant neoplasm of breast: Secondary | ICD-10-CM

## 2016-08-28 DIAGNOSIS — Z9889 Other specified postprocedural states: Secondary | ICD-10-CM

## 2016-09-17 ENCOUNTER — Ambulatory Visit
Admission: RE | Admit: 2016-09-17 | Discharge: 2016-09-17 | Disposition: A | Discharge status: Home / Self Care | Payer: BC Managed Care – PPO | Source: Ambulatory Visit | Attending: Surgery | Admitting: Surgery

## 2016-09-17 DIAGNOSIS — Z853 Personal history of malignant neoplasm of breast: Secondary | ICD-10-CM

## 2016-09-17 DIAGNOSIS — Z9889 Other specified postprocedural states: Secondary | ICD-10-CM

## 2016-09-17 HISTORY — DX: Personal history of irradiation: Z92.3

## 2016-09-17 HISTORY — DX: Personal history of antineoplastic chemotherapy: Z92.21

## 2016-09-23 ENCOUNTER — Other Ambulatory Visit: Payer: Self-pay

## 2016-09-23 DIAGNOSIS — C50412 Malignant neoplasm of upper-outer quadrant of left female breast: Secondary | ICD-10-CM

## 2016-09-23 DIAGNOSIS — Z170 Estrogen receptor positive status [ER+]: Principal | ICD-10-CM

## 2016-09-24 ENCOUNTER — Ambulatory Visit (HOSPITAL_BASED_OUTPATIENT_CLINIC_OR_DEPARTMENT_OTHER): Payer: BC Managed Care – PPO | Admitting: Oncology

## 2016-09-24 ENCOUNTER — Other Ambulatory Visit (HOSPITAL_BASED_OUTPATIENT_CLINIC_OR_DEPARTMENT_OTHER): Payer: BC Managed Care – PPO

## 2016-09-24 ENCOUNTER — Ambulatory Visit (HOSPITAL_COMMUNITY)
Admission: RE | Admit: 2016-09-24 | Discharge: 2016-09-24 | Disposition: A | Discharge status: Home / Self Care | Payer: BC Managed Care – PPO | Source: Ambulatory Visit | Attending: Oncology | Admitting: Oncology

## 2016-09-24 VITALS — BP 138/77 | HR 77 | Temp 98.4°F | Resp 20 | Ht 66.0 in | Wt 222.8 lb

## 2016-09-24 DIAGNOSIS — R0989 Other specified symptoms and signs involving the circulatory and respiratory systems: Secondary | ICD-10-CM

## 2016-09-24 DIAGNOSIS — Z170 Estrogen receptor positive status [ER+]: Secondary | ICD-10-CM | POA: Insufficient documentation

## 2016-09-24 DIAGNOSIS — C50412 Malignant neoplasm of upper-outer quadrant of left female breast: Secondary | ICD-10-CM | POA: Insufficient documentation

## 2016-09-24 DIAGNOSIS — C773 Secondary and unspecified malignant neoplasm of axilla and upper limb lymph nodes: Secondary | ICD-10-CM | POA: Diagnosis not present

## 2016-09-24 DIAGNOSIS — R918 Other nonspecific abnormal finding of lung field: Secondary | ICD-10-CM | POA: Diagnosis not present

## 2016-09-24 DIAGNOSIS — J40 Bronchitis, not specified as acute or chronic: Secondary | ICD-10-CM | POA: Diagnosis not present

## 2016-09-24 DIAGNOSIS — Z79811 Long term (current) use of aromatase inhibitors: Secondary | ICD-10-CM

## 2016-09-24 LAB — COMPREHENSIVE METABOLIC PANEL WITH GFR
ALT: 14 U/L (ref 0–55)
AST: 18 U/L (ref 5–34)
Albumin: 3.5 g/dL (ref 3.5–5.0)
Alkaline Phosphatase: 62 U/L (ref 40–150)
Anion Gap: 10 meq/L (ref 3–11)
BUN: 15.8 mg/dL (ref 7.0–26.0)
CO2: 26 meq/L (ref 22–29)
Calcium: 9.2 mg/dL (ref 8.4–10.4)
Chloride: 108 meq/L (ref 98–109)
Creatinine: 1 mg/dL (ref 0.6–1.1)
EGFR: 79 ml/min/1.73 m2 — ABNORMAL LOW
Glucose: 95 mg/dL (ref 70–140)
Potassium: 3.8 meq/L (ref 3.5–5.1)
Sodium: 144 meq/L (ref 136–145)
Total Bilirubin: 0.38 mg/dL (ref 0.20–1.20)
Total Protein: 7.3 g/dL (ref 6.4–8.3)

## 2016-09-24 LAB — CBC WITH DIFFERENTIAL/PLATELET
BASO%: 0.1 % (ref 0.0–2.0)
BASOS ABS: 0 10*3/uL (ref 0.0–0.1)
EOS%: 1.5 % (ref 0.0–7.0)
Eosinophils Absolute: 0.1 10*3/uL (ref 0.0–0.5)
HEMATOCRIT: 36.2 % (ref 34.8–46.6)
HGB: 11.7 g/dL (ref 11.6–15.9)
LYMPH#: 2.7 10*3/uL (ref 0.9–3.3)
LYMPH%: 37 % (ref 14.0–49.7)
MCH: 27.2 pg (ref 25.1–34.0)
MCHC: 32.3 g/dL (ref 31.5–36.0)
MCV: 84.2 fL (ref 79.5–101.0)
MONO#: 0.4 10*3/uL (ref 0.1–0.9)
MONO%: 5.8 % (ref 0.0–14.0)
NEUT#: 4.1 10*3/uL (ref 1.5–6.5)
NEUT%: 55.6 % (ref 38.4–76.8)
PLATELETS: 229 10*3/uL (ref 145–400)
RBC: 4.3 10*6/uL (ref 3.70–5.45)
RDW: 14.3 % (ref 11.2–14.5)
WBC: 7.3 10*3/uL (ref 3.9–10.3)

## 2016-09-24 NOTE — Progress Notes (Signed)
[facility]  [phone] [phone]   ID: Cailie Belmontes DOB: 12/07/1965  MR#: [id number]  CSN#:[id number]  Patient Care Team: Andy, Camille L, MD as PCP - General (Family Medicine) Newman, David, MD as Consulting Physician (General Surgery) ,  C, MD as Consulting Physician (Oncology) Kinard, James, MD as Consulting Physician (Radiation Oncology) Anderson, Mark E, MD as Consulting Physician (Obstetrics and Gynecology) Horton, Janet, MD as Referring Physician (Radiology) Dixon, Gina B, RN as Registered Nurse PCP: Andy, Camille L, MD OTHER MD:  CHIEF COMPLAINT: Estrogen receptor positive breast cancer  CURRENT TREATMENT:  Tamoxifen; considering PALLAS trial  BREAST CANCER HISTORY: From the original intake note:  Carlota had routine screening mammography at Dr. Anderson's office/[facility] 06/23/2015. There was an area of distortion in the left breast. The patient was then [facility] where on 07/03/2015 he underwent left diagnostic mammography with tomosynthesis and left breast ultrasonography. The breast density was category B. In the far outer left breast there was an area of distortion measuring approximately 4 cm. There appeared to be a prominent left axillary lymph node. On physical exam at the 2:00 position of the left breast 12 cm from the nipple there was an area of approximately 4 cm of firmness. There was no palpable mass in the left axilla. Ultrasonography confirmed an irregular hypoechoic mass in the upper-outer quadrant of the left breast measuring 2.6 cm. A little closer to the axilla there was a second irregular hypoechoic mass measuring 0.5 cm. The distance between these 2 masses was 3.6 cm. In addition there was a right axillary lymph node with a thickened cortex. It measured 0.9 cm.  On 5 or 20 11/30/2015 the patient underwent biopsy of the 2 areas in the breast as well as the axillary lymph node.  The larger of the 2 masses was an invasive lobular carcinoma, E-cadherin negative, estrogen receptor 100% positive, progesterone receptor 100% positive, both with strong staining intensity, with an MIB-1 of 20%. This second, smaller mass, was also an invasive lobular carcinoma, E-cadherin negative, estrogen receptor 90% positive, progesterone receptor 100% positive, both with strong staining intensity, with an MIB-1 of 5%. The right axillary lymph node was also an invasive mammary carcinoma. And also estrogen receptor positive at 70%, progesterone receptor positive at 90%, with an MIB-1 of 10%.  Her subsequent history is as detailed below  INTERVAL HISTORY: Braleigh returns today for follow-up of her estrogen receptor positive breast cancer. She continues on tamoxifen, with good tolerance. She does have some night sweats, but no hot flashes during the day worth speaking of. Despite the night sweats she sleeps most through the night. She doesn't have vaginal wetness or dryness problems. She obtains the drug at less than $5 per month.  REVIEW OF SYSTEMS: Aluel denies unusual headaches, visual changes, nausea, vomiting, dizziness, or gait imbalance. She denies denies any cough, phlegm production, or pleurisy. Has been no fever, rash, or bleeding. There has been no unexplained weight loss or unexplained fatigue. She is exercising with friends who having just joined a gym. She usually goes 3 times a week. She is looking forward to having July often traveling with her family. A detailed review of systems is otherwise stable.  PAST MEDICAL HISTORY: Past Medical History:  Diagnosis Date  . Breast cancer of upper-outer quadrant of left female breast (HCC) 07/13/2015  . Heart murmur    "nothing to worry about"  . Hypertension   . Personal history of chemotherapy 2017  .   [facility]  [phone] [phone]   ID: Cailie Belmontes DOB: 12/07/1965  MR#: [id number]  CSN#:[id number]  Patient Care Team: Andy, Camille L, MD as PCP - General (Family Medicine) Newman, David, MD as Consulting Physician (General Surgery) ,  C, MD as Consulting Physician (Oncology) Kinard, James, MD as Consulting Physician (Radiation Oncology) Anderson, Mark E, MD as Consulting Physician (Obstetrics and Gynecology) Horton, Janet, MD as Referring Physician (Radiology) Dixon, Gina B, RN as Registered Nurse PCP: Andy, Camille L, MD OTHER MD:  CHIEF COMPLAINT: Estrogen receptor positive breast cancer  CURRENT TREATMENT:  Tamoxifen; considering PALLAS trial  BREAST CANCER HISTORY: From the original intake note:  Carlota had routine screening mammography at Dr. Anderson's office/[facility] 06/23/2015. There was an area of distortion in the left breast. The patient was then referred to the [facility] where on 07/03/2015 he underwent left diagnostic mammography with tomosynthesis and left breast ultrasonography. The breast density was category B. In the far outer left breast there was an area of distortion measuring approximately 4 cm. There appeared to be a prominent left axillary lymph node. On physical exam at the 2:00 position of the left breast 12 cm from the nipple there was an area of approximately 4 cm of firmness. There was no palpable mass in the left axilla. Ultrasonography confirmed an irregular hypoechoic mass in the upper-outer quadrant of the left breast measuring 2.6 cm. A little closer to the axilla there was a second irregular hypoechoic mass measuring 0.5 cm. The distance between these 2 masses was 3.6 cm. In addition there was a right axillary lymph node with a thickened cortex. It measured 0.9 cm.  On 5 or 20 11/30/2015 the patient underwent biopsy of the 2 areas in the breast as well as the axillary lymph node.  The larger of the 2 masses was an invasive lobular carcinoma, E-cadherin negative, estrogen receptor 100% positive, progesterone receptor 100% positive, both with strong staining intensity, with an MIB-1 of 20%. This second, smaller mass, was also an invasive lobular carcinoma, E-cadherin negative, estrogen receptor 90% positive, progesterone receptor 100% positive, both with strong staining intensity, with an MIB-1 of 5%. The right axillary lymph node was also an invasive mammary carcinoma. And also estrogen receptor positive at 70%, progesterone receptor positive at 90%, with an MIB-1 of 10%.  Her subsequent history is as detailed below  INTERVAL HISTORY: Braleigh returns today for follow-up of her estrogen receptor positive breast cancer. She continues on tamoxifen, with good tolerance. She does have some night sweats, but no hot flashes during the day worth speaking of. Despite the night sweats she sleeps most through the night. She doesn't have vaginal wetness or dryness problems. She obtains the drug at less than $5 per month.  REVIEW OF SYSTEMS: Aluel denies unusual headaches, visual changes, nausea, vomiting, dizziness, or gait imbalance. She denies denies any cough, phlegm production, or pleurisy. Has been no fever, rash, or bleeding. There has been no unexplained weight loss or unexplained fatigue. She is exercising with friends who having just joined a gym. She usually goes 3 times a week. She is looking forward to having July often traveling with her family. A detailed review of systems is otherwise stable.  PAST MEDICAL HISTORY: Past Medical History:  Diagnosis Date  . Breast cancer of upper-outer quadrant of left female breast (HCC) 07/13/2015  . Heart murmur    "nothing to worry about"  . Hypertension   . Personal history of chemotherapy 2017  .   [facility]  [phone] [phone]   ID: Cailie Belmontes DOB: 12/07/1965  MR#: [id number]  CSN#:[id number]  Patient Care Team: Andy, Camille L, MD as PCP - General (Family Medicine) Newman, David, MD as Consulting Physician (General Surgery) ,  C, MD as Consulting Physician (Oncology) Kinard, James, MD as Consulting Physician (Radiation Oncology) Anderson, Mark E, MD as Consulting Physician (Obstetrics and Gynecology) Horton, Janet, MD as Referring Physician (Radiology) Dixon, Gina B, RN as Registered Nurse PCP: Andy, Camille L, MD OTHER MD:  CHIEF COMPLAINT: Estrogen receptor positive breast cancer  CURRENT TREATMENT:  Tamoxifen; considering PALLAS trial  BREAST CANCER HISTORY: From the original intake note:  Carlota had routine screening mammography at Dr. Anderson's office/[facility] 06/23/2015. There was an area of distortion in the left breast. The patient was then referred to the [facility] where on 07/03/2015 he underwent left diagnostic mammography with tomosynthesis and left breast ultrasonography. The breast density was category B. In the far outer left breast there was an area of distortion measuring approximately 4 cm. There appeared to be a prominent left axillary lymph node. On physical exam at the 2:00 position of the left breast 12 cm from the nipple there was an area of approximately 4 cm of firmness. There was no palpable mass in the left axilla. Ultrasonography confirmed an irregular hypoechoic mass in the upper-outer quadrant of the left breast measuring 2.6 cm. A little closer to the axilla there was a second irregular hypoechoic mass measuring 0.5 cm. The distance between these 2 masses was 3.6 cm. In addition there was a right axillary lymph node with a thickened cortex. It measured 0.9 cm.  On 5 or 20 11/30/2015 the patient underwent biopsy of the 2 areas in the breast as well as the axillary lymph node.  The larger of the 2 masses was an invasive lobular carcinoma, E-cadherin negative, estrogen receptor 100% positive, progesterone receptor 100% positive, both with strong staining intensity, with an MIB-1 of 20%. This second, smaller mass, was also an invasive lobular carcinoma, E-cadherin negative, estrogen receptor 90% positive, progesterone receptor 100% positive, both with strong staining intensity, with an MIB-1 of 5%. The right axillary lymph node was also an invasive mammary carcinoma. And also estrogen receptor positive at 70%, progesterone receptor positive at 90%, with an MIB-1 of 10%.  Her subsequent history is as detailed below  INTERVAL HISTORY: Braleigh returns today for follow-up of her estrogen receptor positive breast cancer. She continues on tamoxifen, with good tolerance. She does have some night sweats, but no hot flashes during the day worth speaking of. Despite the night sweats she sleeps most through the night. She doesn't have vaginal wetness or dryness problems. She obtains the drug at less than $5 per month.  REVIEW OF SYSTEMS: Aluel denies unusual headaches, visual changes, nausea, vomiting, dizziness, or gait imbalance. She denies denies any cough, phlegm production, or pleurisy. Has been no fever, rash, or bleeding. There has been no unexplained weight loss or unexplained fatigue. She is exercising with friends who having just joined a gym. She usually goes 3 times a week. She is looking forward to having July often traveling with her family. A detailed review of systems is otherwise stable.  PAST MEDICAL HISTORY: Past Medical History:  Diagnosis Date  . Breast cancer of upper-outer quadrant of left female breast (HCC) 07/13/2015  . Heart murmur    "nothing to worry about"  . Hypertension   . Personal history of chemotherapy 2017  .   [facility]  [phone] [phone]   ID: Cailie Belmontes DOB: 12/07/1965  MR#: [id number]  CSN#:[id number]  Patient Care Team: Andy, Camille L, MD as PCP - General (Family Medicine) Newman, David, MD as Consulting Physician (General Surgery) ,  C, MD as Consulting Physician (Oncology) Kinard, James, MD as Consulting Physician (Radiation Oncology) Anderson, Mark E, MD as Consulting Physician (Obstetrics and Gynecology) Horton, Janet, MD as Referring Physician (Radiology) Dixon, Gina B, RN as Registered Nurse PCP: Andy, Camille L, MD OTHER MD:  CHIEF COMPLAINT: Estrogen receptor positive breast cancer  CURRENT TREATMENT:  Tamoxifen; considering PALLAS trial  BREAST CANCER HISTORY: From the original intake note:  Carlota had routine screening mammography at Dr. Anderson's office/[facility] 06/23/2015. There was an area of distortion in the left breast. The patient was then referred to the [facility] where on 07/03/2015 he underwent left diagnostic mammography with tomosynthesis and left breast ultrasonography. The breast density was category B. In the far outer left breast there was an area of distortion measuring approximately 4 cm. There appeared to be a prominent left axillary lymph node. On physical exam at the 2:00 position of the left breast 12 cm from the nipple there was an area of approximately 4 cm of firmness. There was no palpable mass in the left axilla. Ultrasonography confirmed an irregular hypoechoic mass in the upper-outer quadrant of the left breast measuring 2.6 cm. A little closer to the axilla there was a second irregular hypoechoic mass measuring 0.5 cm. The distance between these 2 masses was 3.6 cm. In addition there was a right axillary lymph node with a thickened cortex. It measured 0.9 cm.  On 5 or 20 11/30/2015 the patient underwent biopsy of the 2 areas in the breast as well as the axillary lymph node.  The larger of the 2 masses was an invasive lobular carcinoma, E-cadherin negative, estrogen receptor 100% positive, progesterone receptor 100% positive, both with strong staining intensity, with an MIB-1 of 20%. This second, smaller mass, was also an invasive lobular carcinoma, E-cadherin negative, estrogen receptor 90% positive, progesterone receptor 100% positive, both with strong staining intensity, with an MIB-1 of 5%. The right axillary lymph node was also an invasive mammary carcinoma. And also estrogen receptor positive at 70%, progesterone receptor positive at 90%, with an MIB-1 of 10%.  Her subsequent history is as detailed below  INTERVAL HISTORY: Braleigh returns today for follow-up of her estrogen receptor positive breast cancer. She continues on tamoxifen, with good tolerance. She does have some night sweats, but no hot flashes during the day worth speaking of. Despite the night sweats she sleeps most through the night. She doesn't have vaginal wetness or dryness problems. She obtains the drug at less than $5 per month.  REVIEW OF SYSTEMS: Aluel denies unusual headaches, visual changes, nausea, vomiting, dizziness, or gait imbalance. She denies denies any cough, phlegm production, or pleurisy. Has been no fever, rash, or bleeding. There has been no unexplained weight loss or unexplained fatigue. She is exercising with friends who having just joined a gym. She usually goes 3 times a week. She is looking forward to having July often traveling with her family. A detailed review of systems is otherwise stable.  PAST MEDICAL HISTORY: Past Medical History:  Diagnosis Date  . Breast cancer of upper-outer quadrant of left female breast (HCC) 07/13/2015  . Heart murmur    "nothing to worry about"  . Hypertension   . Personal history of chemotherapy 2017  .

## 2016-11-22 ENCOUNTER — Encounter: Payer: Self-pay | Admitting: Adult Health

## 2016-11-22 ENCOUNTER — Ambulatory Visit (HOSPITAL_BASED_OUTPATIENT_CLINIC_OR_DEPARTMENT_OTHER): Payer: BC Managed Care – PPO | Admitting: Adult Health

## 2016-11-22 VITALS — BP 147/76 | HR 80 | Temp 97.7°F | Resp 18 | Ht 66.0 in | Wt 223.8 lb

## 2016-11-22 DIAGNOSIS — C50412 Malignant neoplasm of upper-outer quadrant of left female breast: Secondary | ICD-10-CM

## 2016-11-22 DIAGNOSIS — Z170 Estrogen receptor positive status [ER+]: Secondary | ICD-10-CM | POA: Diagnosis not present

## 2016-11-22 DIAGNOSIS — Z79811 Long term (current) use of aromatase inhibitors: Secondary | ICD-10-CM

## 2016-11-22 NOTE — Progress Notes (Signed)
CLINIC:  Survivorship   REASON FOR VISIT:  Routine follow-up post-treatment for a recent history of breast cancer.  BRIEF ONCOLOGIC HISTORY:    Malignant neoplasm of upper-outer quadrant of left breast in female, estrogen receptor positive ([place])   07/13/2015 Initial Diagnosis    Malignant neoplasm of upper-outer quadrant of left breast in female, estrogen receptor positive ([place])     08/07/2015 - 10/12/2015 Neo-Adjuvant Chemotherapy    Docetaxel, cyclophosphamide every 3 weeks times 4.        12/01/2015 Surgery    Left lumpectomy Lucia Gaskins): ILC, grade II, 4.7cm, margins negative, 2/13 lymph nodes positive, ypT2, ypN1a, ER+(70%), PR+(95%), HER-2 negative (ratio 1.11).        01/25/2016 - 03/09/2016 Radiation Therapy    At [facility]:  ----------------------------------------------------------------------------------------------------- LT breast/IMN, PWT 5000cGy 35 200cGy 9/14 - 02/28/16 ----------------------------------------------------------------------------------------------------- LT SCV/AX, RAO.LPO 5000cGy 35 200cGy 9/14 - 02/28/16 ----------------------------------------------------------------------------------------------------- LT TB boost, bouquet 1200cGy 8 200cGy 10/19 - 03/07/16  ----------------------------------------------------------------------------------------------------- Totals 6200cGy       03/2016 -  Anti-estrogen oral therapy    Tamoxifen daily      04/2016 Miscellaneous    [facility] consent, signed not enrolled       INTERVAL HISTORY:  Emma [name] presents to the [facility] today for our initial meeting to review her survivorship care plan detailing her treatment course for breast cancer, as well as monitoring long-term side effects of that treatment, education regarding health maintenance, screening, and overall wellness and health promotion.     Overall, Emma [name] reports feeling quite well.  She is taking Tamoxifen daily and so far is only  experiencing mild, infrequent hot flashes.  She is not consistently exercising.  But does f/u with her PCP and GYN regularly.      REVIEW OF SYSTEMS:  Review of Systems  Constitutional: Negative for appetite change, chills, diaphoresis, fatigue and unexpected weight change.  HENT:   Negative for hearing loss and lump/mass.   Eyes: Negative for eye problems and icterus.  Respiratory: Negative for chest tightness, cough and shortness of breath.   Cardiovascular: Negative for chest pain, leg swelling and palpitations.  Gastrointestinal: Negative for abdominal distention, abdominal pain, constipation, diarrhea, nausea and vomiting.  Endocrine: Positive for hot flashes.  Genitourinary: Negative for difficulty urinating.   Musculoskeletal: Negative for arthralgias.  Skin: Negative for itching and rash.  Neurological: Negative for dizziness, extremity weakness, headaches and numbness.  Hematological: Negative for adenopathy. Does not bruise/bleed easily.  Psychiatric/Behavioral: Negative for depression. The patient is not nervous/anxious.   Breast: Denies any new nodularity, masses, tenderness, nipple changes, or nipple discharge.      ONCOLOGY TREATMENT TEAM:  1. Surgeon:  Dr. Lucia Gaskins at [facility] 2. Medical Oncologist: Dr. Jana Hakim  3. Radiation Oncologist: Dr. Sondra Come    PAST MEDICAL/SURGICAL HISTORY:  Past Medical History:  Diagnosis Date  . Breast cancer of upper-outer quadrant of left female breast ([place]) 07/13/2015  . Heart murmur    "nothing to worry about"  . Hypertension   . Personal history of chemotherapy 2017  . Personal history of radiation therapy 2017   Past Surgical History:  Procedure Laterality Date  . BREAST BIOPSY Left 07/10/2015  . BREAST LUMPECTOMY Left 12/01/2015   LEFT BREAST SEED GUIDED LUMPECTOMY WITH LEFT AXILLARY NODE DISSECTION (Left)  . CESAREAN SECTION  1998  . COLONOSCOPY W/ POLYPECTOMY    . DILATION AND EVACUATION  05/01/2011    Procedure: DILATATION AND EVACUATION;  Surgeon: Olga Millers;  Location: [facility]  ORS;  Service: Gynecology;  Laterality: N/A;  . FOOT SURGERY Bilateral 1993   bunionectomy  . PORT-A-CATH REMOVAL  12/01/2015  . PORT-A-CATH REMOVAL Right 12/01/2015   Procedure: REMOVAL PORT-A-CATH;  Surgeon: Alphonsa Overall, MD;  Location: [facility];  Service: General;  Laterality: Right;  . PORTACATH PLACEMENT Right 07/31/2015   Procedure: INSERTION PORT-A-CATH;  Surgeon: Alphonsa Overall, MD;  Location: [facility];  Service: General;  Laterality: Right;  . RADIOACTIVE SEED GUIDED PARTIAL MASTECTOMY/AXILLARY SENTINEL NODE BIOPSY/AXILLARY NODE DISSECTION Left 12/01/2015   Procedure: LEFT BREAST SEED GUIDED LUMPECTOMY WITH LEFT AXILLARY NODE DISSECTION;  Surgeon: Alphonsa Overall, MD;  Location: [place];  Service: General;  Laterality: Left;     ALLERGIES:  No Known Allergies   CURRENT MEDICATIONS:  Outpatient Encounter Prescriptions as of 11/22/2016  Medication Sig  . amLODipine (NORVASC) 10 MG tablet Take 10 mg by mouth daily.  . tamoxifen (NOLVADEX) 20 MG tablet Take 20 mg by mouth daily.  . [DISCONTINUED] ferrous sulfate 325 (65 FE) MG EC tablet Take 325 mg by mouth daily with breakfast.   No facility-administered encounter medications on file as of 11/22/2016.      ONCOLOGIC FAMILY HISTORY:  Family History  Problem Relation Age of Onset  . Colon cancer Mother      GENETIC COUNSELING/TESTING: Not at this time  SOCIAL HISTORY:  Emma [name] is married and lives with her husband and two children, a son and daughter in [place], [place].  She has 3 children, 2 live with her, and one son is in the [employer], currently stationed in [place].  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  She denies any current or history of tobacco, alcohol, or illicit drug use.     PHYSICAL EXAMINATION:  Vital Signs:   Vitals:   11/22/16 1129  BP:  (!) 147/76  Pulse: 80  Resp: 18  Temp: 97.7 F (36.5 C)   Filed Weights   11/22/16 1129  Weight: 223 lb 12.8 oz (101.5 kg)   General: Well-nourished, well-appearing female in no acute distress.  She is unaccompanied today.   HEENT: Head is normocephalic.  Pupils equal and reactive to light. Conjunctivae clear without exudate.  Sclerae anicteric. Oral mucosa is pink, moist.  Oropharynx is pink without lesions or erythema.  Lymph: No cervical, supraclavicular, or infraclavicular lymphadenopathy noted on palpation.  Cardiovascular: Regular rate and rhythm.Marland Kitchen Respiratory: Clear to auscultation bilaterally. Chest expansion symmetric; breathing non-labored.  Breasts: left breast is slightly thicker, scar tissue well healed, no nodules, masses, or skin changes, right breasts with no nodules masses, skin/nipple changes GI: Abdomen soft and round; non-tender, non-distended. Bowel sounds normoactive.  GU: Deferred.  Neuro: No focal deficits. Steady gait.  Psych: Mood and affect normal and appropriate for situation.  Extremities: No edema. MSK: No focal spinal tenderness to palpation.  Full range of motion in bilateral upper extremities Skin: Warm and dry.  LABORATORY DATA:  None for this visit.  DIAGNOSTIC IMAGING:  None for this visit.      ASSESSMENT AND PLAN:  Ms.. [name] is a pleasant 51 y.o. 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  She presents to the Survivorship Clinic for our initial meeting and routine follow-up post-completion of treatment for breast cancer.    1. Stage IIB left breast cancer:  Emma [name] is  continuing to recover from definitive treatment for breast cancer. She will follow-up with her medical oncologist, Dr. Jana Hakim in 09/2017 with history and physical exam per surveillance protocol.   She will continue her anti-estrogen therapy with Tamoxifen daily. Thus far, she is tolerating the Tamoxifen well, with minimal side effects. She was instructed to make Dr. Jana Hakim or myself aware if she begins to experience any worsening side effects of the medication and I could see her back in clinic to help manage those side effects, as needed. Today, a comprehensive survivorship care plan and treatment summary was reviewed with the patient today detailing her breast cancer diagnosis, treatment course, potential late/long-term effects of treatment, appropriate follow-up care with recommendations for the future, and patient education resources.  A copy of this summary, along with a letter will be sent to the patient's primary care provider via mail/fax/In Basket message after today's visit.    2. Bone health:  Given Emma [name]'s history of breast cancer she is at slight risk for bone demineralization.  She was given education on specific activities to promote bone health.  3. Cancer screening:  Due to Emma [name]'s history and her age, she should receive screening for skin cancers, colon cancer, and gynecologic cancers.  The information and recommendations are listed on the patient's comprehensive care plan/treatment summary and were reviewed in detail with the patient.    4. Health maintenance and wellness promotion: Emma [name] was encouraged to consume 5-7 servings of fruits and vegetables per day. We reviewed the "Nutrition Rainbow" handout, as well as the handout "Take Control of Your Health and Reduce Your Cancer Risk" from the [facility].  She was also encouraged to engage in moderate to vigorous exercise for 30 minutes per day most days of the week. We discussed the [employer] fitness program, which is designed for cancer survivors to help them become more physically fit after cancer treatments.  She was instructed to limit her alcohol consumption and continue to abstain  from tobacco use.     5. Support services/counseling: It is not uncommon for this period of the patient's cancer care trajectory to be one of many emotions and stressors.  We discussed an opportunity for her to participate in the next session of [facility] ("Finding Your New Normal") support group series designed for patients after they have completed treatment.   Emma [name] was encouraged to take advantage of our many other support services programs, support groups, and/or counseling in coping with her new life as a cancer survivor after completing anti-cancer treatment.  She was offered support today through active listening and expressive supportive counseling.  She was given information regarding our available services and encouraged to contact me with any questions or for help enrolling in any of our support group/programs.    Dispo:   -Return to cancer center in May, 2019 for follow up with Dr. Lindi Adie  -Mammogram due in 09/2017 -Follow up with Dr. Lucia Gaskins at [facility] in November, 2018 -She is welcome to return back to the Survivorship Clinic at any time; no additional follow-up needed at this time.  -Consider referral back to survivorship as a long-term survivor for continued surveillance  A total of (30) minutes of face-to-face time was spent with this patient with greater than 50% of that time in counseling and care-coordination.   Gardenia Phlegm, [place] [phone]   Note: PRIMARY CARE PROVIDER Leamon Arnt, [place] [phone]

## 2017-03-21 ENCOUNTER — Other Ambulatory Visit: Payer: Self-pay | Admitting: Oncology

## 2017-06-30 ENCOUNTER — Other Ambulatory Visit: Payer: Self-pay | Admitting: Oncology

## 2017-07-01 ENCOUNTER — Other Ambulatory Visit: Payer: Self-pay | Admitting: Family Medicine

## 2017-07-01 ENCOUNTER — Other Ambulatory Visit: Payer: Self-pay | Admitting: Surgery

## 2017-07-01 DIAGNOSIS — Z853 Personal history of malignant neoplasm of breast: Secondary | ICD-10-CM

## 2017-09-18 ENCOUNTER — Ambulatory Visit
Admission: RE | Admit: 2017-09-18 | Discharge: 2017-09-18 | Disposition: A | Discharge status: Home / Self Care | Payer: BC Managed Care – PPO | Source: Ambulatory Visit | Attending: Surgery | Admitting: Surgery

## 2017-09-18 DIAGNOSIS — Z853 Personal history of malignant neoplasm of breast: Secondary | ICD-10-CM

## 2017-09-23 ENCOUNTER — Other Ambulatory Visit: Payer: Self-pay | Admitting: *Deleted

## 2017-09-23 DIAGNOSIS — Z170 Estrogen receptor positive status [ER+]: Principal | ICD-10-CM

## 2017-09-23 DIAGNOSIS — C50412 Malignant neoplasm of upper-outer quadrant of left female breast: Secondary | ICD-10-CM

## 2017-09-23 NOTE — Progress Notes (Signed)
[facility]  [phone] [phone]) [phone]   ID: Emma [name] DOB: 1965-05-28  MR#: [id number]  BMW#:[id number]  Patient Care Team: Patient, No Pcp Per as PCP - General (General Practice) [name], MD as Consulting Physician (General Surgery) Magrinat, Valentino Hue, MD as Consulting Physician (Oncology) Antony Blackbird, MD as Consulting Physician (Radiation Oncology) Levi Aland, MD as Consulting Physician (Obstetrics and Gynecology) Lyanne Co, MD as Referring Physician (Radiology) Axel Filler, Larna Daughters, NP as Nurse Practitioner (Hematology and Oncology) PCP: Patient, No Pcp Per OTHER MD:  CHIEF COMPLAINT: Estrogen receptor positive breast cancer  CURRENT TREATMENT:  Tamoxifen  BREAST CANCER HISTORY: From the original intake note:  Emma [name] had routine screening mammography at Dr. Ewell Poe office/[facility] 06/23/2015. There was an area of distortion in the left breast. The patient was then referred to the [facility] where on 07/03/2015 he underwent left diagnostic mammography with tomosynthesis and left breast ultrasonography. The breast density was category B. In the far outer left breast there was an area of distortion measuring approximately 4 cm. There appeared to be a prominent left axillary lymph node. On physical exam at the 2:00 position of the left breast 12 cm from the nipple there was an area of approximately 4 cm of firmness. There was no palpable mass in the left axilla. Ultrasonography confirmed an irregular hypoechoic mass in the upper-outer quadrant of the left breast measuring 2.6 cm. A little closer to the axilla there was a second irregular hypoechoic mass measuring 0.5 cm. The distance between these 2 masses was 3.6 cm. In addition there was a right axillary lymph node with a thickened cortex. It measured 0.9 cm.  On 5 or 20 11/30/2015 the patient underwent biopsy of the 2 areas in the breast as well as the axillary  lymph node. The larger of the 2 masses was an invasive lobular carcinoma, E-cadherin negative, estrogen receptor 100% positive, progesterone receptor 100% positive, both with strong staining intensity, with an MIB-1 of 20%. This second, smaller mass, was also an invasive lobular carcinoma, E-cadherin negative, estrogen receptor 90% positive, progesterone receptor 100% positive, both with strong staining intensity, with an MIB-1 of 5%. The right axillary lymph node was also an invasive mammary carcinoma. And also estrogen receptor positive at 70%, progesterone receptor positive at 90%, with an MIB-1 of 10%.  Her subsequent history is as detailed below  INTERVAL HISTORY: Emma [name] returns today for follow-up of her estrogen receptor positive breast cancer accompanied by her husband. She continues on tamoxifen, with good tolerance. She gets a couple of hot flashes during the night. She denies issues with increased vaginal discharge.   Since her last visit, she underwent diagnostic bilateral mammography with CAD and tomography [facility] showing: breast density category B. There was no evidence of malignancy.    REVIEW OF SYSTEMS: Emma [name] reports that she is still working. She has 2 years until she can retire. She plans to take her son to the beach and the cultural museum in [place]. For exercise, she goes to the gym 3 times per week and she takes Zumba classes. She denies unusual headaches, visual changes, nausea, vomiting, or dizziness. There has been no unusual cough, phlegm production, or pleurisy. This been no change in bowel or bladder habits. She denies unexplained fatigue or unexplained weight loss, bleeding, rash, or fever. A detailed review of systems was otherwise stable.    PAST MEDICAL HISTORY: Past Medical History:  Diagnosis Date  . Breast  cancer of upper-outer quadrant of left female breast (HCC) 07/13/2015  . Heart murmur    "nothing to worry about"  . Hypertension   .  Personal history of chemotherapy 2017  . Personal history of radiation therapy 2017    PAST SURGICAL HISTORY: Past Surgical History:  Procedure Laterality Date  . BREAST BIOPSY Left 07/10/2015  . BREAST LUMPECTOMY Left 12/01/2015   LEFT BREAST SEED GUIDED LUMPECTOMY WITH LEFT AXILLARY NODE DISSECTION (Left)  . CESAREAN SECTION  1998  . COLONOSCOPY W/ POLYPECTOMY    . DILATION AND EVACUATION  05/01/2011   Procedure: DILATATION AND EVACUATION;  Surgeon: Levi Aland;  Location: [facility];  Service: Gynecology;  Laterality: N/A;  . FOOT SURGERY Bilateral 1993   bunionectomy  . PORT-A-CATH REMOVAL  12/01/2015  . PORT-A-CATH REMOVAL Right 12/01/2015   Procedure: REMOVAL PORT-A-CATH;  Surgeon: [name], MD;  Location: [facility];  Service: General;  Laterality: Right;  . PORTACATH PLACEMENT Right 07/31/2015   Procedure: INSERTION PORT-A-CATH;  Surgeon: [name], MD;  Location: [facility];  Service: General;  Laterality: Right;  . RADIOACTIVE SEED GUIDED PARTIAL MASTECTOMY/AXILLARY SENTINEL NODE BIOPSY/AXILLARY NODE DISSECTION Left 12/01/2015   Procedure: LEFT BREAST SEED GUIDED LUMPECTOMY WITH LEFT AXILLARY NODE DISSECTION;  Surgeon: [name], MD;  Location: [facility] OR;  Service: General;  Laterality: Left;    FAMILY HISTORY Family History  Problem Relation Age of Onset  . Colon cancer Mother   The patient's father died at age 22 from "complicated causes". The patient's mother died from colon cancer at the age of 42. He cancer was diagnosed the year prior. The patient has 3 brothers, 11 sisters. There is no history of breast or ovarian cancer and no other colon cancers in the family  GYNECOLOGIC HISTORY:  Patient's last menstrual period was 09/08/2015. Menarche age 11, first live birth age 59, which the patient understands increases the risk of breast cancer. She is GX P2. She Was still having regular periods as of March 2017, but periods stopped during chemotherapy. So far  there have not resumed. She used birth control remotely with no complications. Current birth control and include condom with spermicide, with a diaphragm to be added.  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 Her husband [name] works for a Midwife is Chiropodist of environmental services. [name] has a child from a prior marriage, Mattie Ulinski, who is in the [employer]. Elpidia as a child from a prior marriage, [employer]pard [employer], who is going to school in [place]. The couple have a son, [name], 57 years old, at home. The patient has one grandchild. She goes to a local [employer].    ADVANCED DIRECTIVES: no   HEALTH MAINTENANCE: Social History   Tobacco Use  . Smoking status: Never Smoker  . Smokeless tobacco: Never Used  Substance Use Topics  . Alcohol use: No    Comment: social  . Drug use: No     Colonoscopy: January 2016/[facility]  PAP: February 2017  Bone density: Never  Lipid panel:  No Known Allergies  Current Outpatient Medications  Medication Sig Dispense Refill  . amLODipine (NORVASC) 10 MG tablet Take 10 mg by mouth daily.  0  . gabapentin (NEURONTIN) 300 MG capsule Take 1 capsule (300 mg total) by mouth at bedtime. 90 capsule 4  . tamoxifen (NOLVADEX) 20 MG tablet TAKE 1 TABLET BY MOUTH EVERY DAY 90 tablet 0  No current facility-administered medications for this visit.     OBJECTIVE: Middle-aged African-American woman in no acute distress  Vitals:   09/24/17 1259  BP: 138/84  Pulse: 67  Resp: 18  Temp: 98.6 F (37 C)  SpO2: 100%     Body mass index is 36.48 kg/m.    ECOG FS:0 - Asymptomatic  Sclerae unicteric, pupils round and equal No cervical or supraclavicular adenopathy Lungs no rales or rhonchi Heart regular rate and rhythm Abd soft, nontender, positive bowel sounds MSK no focal spinal tenderness, no upper extremity lymphedema Neuro: nonfocal,  well oriented, appropriate affect Breasts: The right breast is benign.  On the left the patient is status post lumpectomy followed by radiation.  There is no evidence of local recurrence.  Both axillae are benign.  LAB RESULTS:  CMP     Component Value Date/Time   NA 144 09/24/2016 1414   K 3.8 09/24/2016 1414   CL 109 11/24/2015 1449   CO2 26 09/24/2016 1414   GLUCOSE 95 09/24/2016 1414   BUN 15.8 09/24/2016 1414   CREATININE 1.0 09/24/2016 1414   CALCIUM 9.2 09/24/2016 1414   PROT 7.3 09/24/2016 1414   ALBUMIN 3.5 09/24/2016 1414   AST 18 09/24/2016 1414   ALT 14 09/24/2016 1414   ALKPHOS 62 09/24/2016 1414   BILITOT 0.38 09/24/2016 1414   GFRNONAA >60 11/24/2015 1449   GFRAA >60 11/24/2015 1449    INo results found for: SPEP, UPEP  Lab Results  Component Value Date   WBC 7.9 09/24/2017   NEUTROABS 4.4 09/24/2017   HGB 12.2 09/24/2017   HCT 37.4 09/24/2017   MCV 82.8 09/24/2017   PLT 242 09/24/2017      Chemistry      Component Value Date/Time   NA 144 09/24/2016 1414   K 3.8 09/24/2016 1414   CL 109 11/24/2015 1449   CO2 26 09/24/2016 1414   BUN 15.8 09/24/2016 1414   CREATININE 1.0 09/24/2016 1414      Component Value Date/Time   CALCIUM 9.2 09/24/2016 1414   ALKPHOS 62 09/24/2016 1414   AST 18 09/24/2016 1414   ALT 14 09/24/2016 1414   BILITOT 0.38 09/24/2016 1414      No results found for: LABCA2  No components found for: LABCA125  No results for input(s): INR in the last 168 hours.  Urinalysis No results found for: COLORURINE, APPEARANCEUR, LABSPEC, PHURINE, GLUCOSEU, HGBUR, BILIRUBINUR, KETONESUR, PROTEINUR, UROBILINOGEN, NITRITE, LEUKOCYTESUR   ELIGIBLE FOR AVAILABLE RESEARCH PROTOCOL: no  STUDIES: Mm Diag Breast Tomo Bilateral  Result Date: 09/18/2017 CLINICAL DATA:  Annual examination. History of left breast cancer diagnosed in 2017. The patient is asymptomatic. EXAM: DIGITAL DIAGNOSTIC BILATERAL MAMMOGRAM WITH CAD AND TOMO COMPARISON:   Previous exam(s). ACR Breast Density Category b: There are scattered areas of fibroglandular density. FINDINGS: Stable lumpectomy changes in the deep upper outer left breast. Skin thickening of the left breast is slightly decreased compared to the prior year. No mass, nonsurgical distortion, or suspicious microcalcifications identified in either breast to suggest malignancy. Mammographic images were processed with CAD. IMPRESSION: No evidence of malignancy in either breast. Lumpectomy and radiation changes on the left. RECOMMENDATION: Diagnostic mammogram is suggested in 1 year. (Code:[id number]-B-01Y) I have discussed the findings and recommendations with the patient. Results were also provided in writing at the conclusion of the visit. If applicable, a reminder letter will be sent to the patient regarding the next appointment. BI-RADS CATEGORY  2: Benign. Electronically Signed  By: Britta Mccreedy M.D.   On: 09/18/2017 10:54    ASSESSMENT: 52 y.o. [facility] woman status post left breastUpper outer quadrant biopsy 07/10/2015 of 2 separate masses as well as left axillary lymph node, all positive for invasive lobular carcinoma, grade 1, estrogen and progesterone receptor positive, HER-2 not amplified, with an MIB-1 between 5 and 20%  (1) neoadjuvan chemotherapy consisting of cyclophosphamide and docetaxel every 3 weeks 4, First dose 08/07/2015, final dose 10/12/2015  (2) status post left lumpectomy and axillary lymph node dissection 12/01/2015 for a residual pT2 pN1, stage IIA invasive lobular carcinoma, repeat prognostic panel again estrogen and progesterone receptor positive, HER-2 not amplified. Margins were close but negative   (3) adjuvant radiation at [facility], completed 03/07/2016 ----------------------------------------------------------------------------------------------------- LT breast/IMN, PWT 5000cGy 35 200cGy 9/14 -  02/28/16 ----------------------------------------------------------------------------------------------------- LT SCV/AX, RAO.LPO 5000cGy 35 200cGy 9/14 - 02/28/16 ----------------------------------------------------------------------------------------------------- LT TB boost, bouquet 1200cGy 8 200cGy 10/19 - 03/07/16  ----------------------------------------------------------------------------------------------------- Totals 6200cGy   (4) started tamoxifen 03/25/2016--plan is for 10 years on antiestrogen  (5) consider the PALLAS trial but never enrolled  PLAN: Lindsey is now just about 2 years out from definitive surgery for her breast cancer with no evidence of disease recurrence.  This is favorable.  She is tolerating tamoxifen well.  Currently her understanding of lobular breast cancer includes the fact that they can recur late.  Accordingly a longer treatment with antiestrogens is warranted.  We will continue tamoxifen for 10 years in this case.  Note that she is having some symptoms of menopause and has not had a period in over a year.  I am adding gabapentin to her medications for her to take at bedtime to see if that helps with the nighttime hot flashes.  Otherwise she will return to see me in a year.  She knows to call for any problems that may develop before that visit.   Magrinat, Valentino Hue, MD  09/24/17 1:29 PM Medical Oncology and Hematology [facility] [address] [place], [place] [address] Tel. [phone]    Fax. [phone]  This document serves as a record of services personally performed by Ruthann Cancer, MD. It was created on his behalf by Merideth Abbey, a trained medical scribe. The creation of this record is based on the scribe's personal observations and the provider's statements to them.   I have reviewed the above documentation for accuracy and completeness, and I agree with the above.

## 2017-09-24 ENCOUNTER — Telehealth: Payer: Self-pay | Admitting: Oncology

## 2017-09-24 ENCOUNTER — Inpatient Hospital Stay: Payer: BC Managed Care – PPO | Attending: Oncology | Admitting: Oncology

## 2017-09-24 ENCOUNTER — Inpatient Hospital Stay: Payer: BC Managed Care – PPO

## 2017-09-24 VITALS — BP 138/84 | HR 67 | Temp 98.6°F | Resp 18 | Ht 66.0 in | Wt 226.0 lb

## 2017-09-24 DIAGNOSIS — C50412 Malignant neoplasm of upper-outer quadrant of left female breast: Secondary | ICD-10-CM

## 2017-09-24 DIAGNOSIS — Z79811 Long term (current) use of aromatase inhibitors: Secondary | ICD-10-CM | POA: Diagnosis not present

## 2017-09-24 DIAGNOSIS — Z923 Personal history of irradiation: Secondary | ICD-10-CM

## 2017-09-24 DIAGNOSIS — Z9221 Personal history of antineoplastic chemotherapy: Secondary | ICD-10-CM

## 2017-09-24 DIAGNOSIS — Z800 Family history of malignant neoplasm of digestive organs: Secondary | ICD-10-CM

## 2017-09-24 DIAGNOSIS — Z79899 Other long term (current) drug therapy: Secondary | ICD-10-CM | POA: Diagnosis not present

## 2017-09-24 DIAGNOSIS — N951 Menopausal and female climacteric states: Secondary | ICD-10-CM

## 2017-09-24 DIAGNOSIS — Z170 Estrogen receptor positive status [ER+]: Secondary | ICD-10-CM | POA: Diagnosis not present

## 2017-09-24 LAB — CBC WITH DIFFERENTIAL (CANCER CENTER ONLY)
Basophils Absolute: 0 10*3/uL (ref 0.0–0.1)
Basophils Relative: 0 %
EOS ABS: 0.1 10*3/uL (ref 0.0–0.5)
EOS PCT: 1 %
HCT: 37.4 % (ref 34.8–46.6)
Hemoglobin: 12.2 g/dL (ref 11.6–15.9)
LYMPHS PCT: 37 %
Lymphs Abs: 2.9 10*3/uL (ref 0.9–3.3)
MCH: 27 pg (ref 25.1–34.0)
MCHC: 32.6 g/dL (ref 31.5–36.0)
MCV: 82.8 fL (ref 79.5–101.0)
MONO ABS: 0.5 10*3/uL (ref 0.1–0.9)
MONOS PCT: 6 %
Neutro Abs: 4.4 10*3/uL (ref 1.5–6.5)
Neutrophils Relative %: 56 %
PLATELETS: 242 10*3/uL (ref 145–400)
RBC: 4.52 MIL/uL (ref 3.70–5.45)
RDW: 14.9 % — ABNORMAL HIGH (ref 11.2–14.5)
WBC Count: 7.9 10*3/uL (ref 3.9–10.3)

## 2017-09-24 LAB — CMP (CANCER CENTER ONLY)
ALBUMIN: 3.6 g/dL (ref 3.5–5.0)
ALT: 13 U/L (ref 0–55)
AST: 16 U/L (ref 5–34)
Alkaline Phosphatase: 65 U/L (ref 40–150)
Anion gap: 4 (ref 3–11)
BUN: 17 mg/dL (ref 7–26)
CO2: 28 mmol/L (ref 22–29)
CREATININE: 0.99 mg/dL (ref 0.60–1.10)
Calcium: 9.1 mg/dL (ref 8.4–10.4)
Chloride: 111 mmol/L — ABNORMAL HIGH (ref 98–109)
GFR, Est AFR Am: >60 mL/min (ref >60)
GLUCOSE: 86 mg/dL (ref 70–140)
POTASSIUM: 4 mmol/L (ref 3.5–5.1)
SODIUM: 143 mmol/L (ref 136–145)
Total Bilirubin: 0.3 mg/dL (ref 0.2–1.2)
Total Protein: 7.2 g/dL (ref 6.4–8.3)

## 2017-09-24 MED ORDER — GABAPENTIN 300 MG PO CAPS: 300.0000 mg | ORAL_CAPSULE | Freq: Every day | ORAL | 4 refills | Status: AC

## 2017-09-24 NOTE — Telephone Encounter (Signed)
Gave patient AVs and calendar of upcoming May 2020 appointments.  °

## 2017-10-03 IMAGING — US US BREAST*L* LIMITED INC AXILLA
1 series · 12 of 14 positions shown · non-contrast
Comparison: 06/19/2015

ADDENDUM:
This addendum is created to correct a left/right error in the
original report. The final paragraph of the FINDINGS section should
read as follows:

In the LEFT axilla, a lymph node with a thickened cortex and
partially effaced fatty hilum is imaged. This lymph node measures 9
mm short axis, and the cortex measures 4 mm. There is some vascular
flow within a portion of the cortex.
CLINICAL DATA: Possible distortion left breast identified on recent
screening mammogram.
EXAM:
DIGITAL DIAGNOSTIC LEFT MAMMOGRAM WITH 3D TOMOSYNTHESIS WITH CAD
ULTRASOUND LEFT BREAST

[Series 1: us breast*left* limited inc axilla · 0.07mm/px · 12 of 14 slices shown]
[im 1/14]
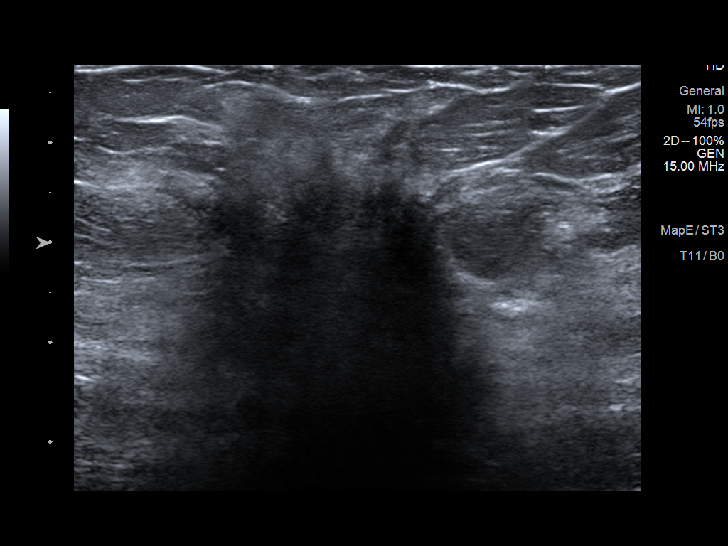
[im 2/14]
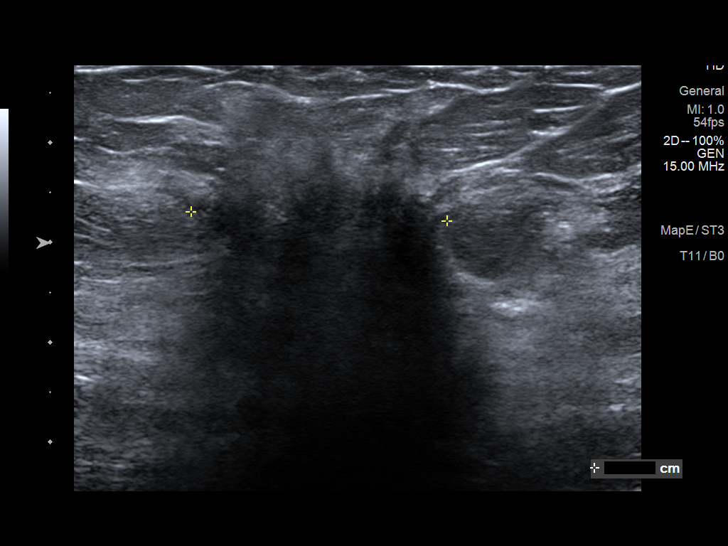
[im 3/14]
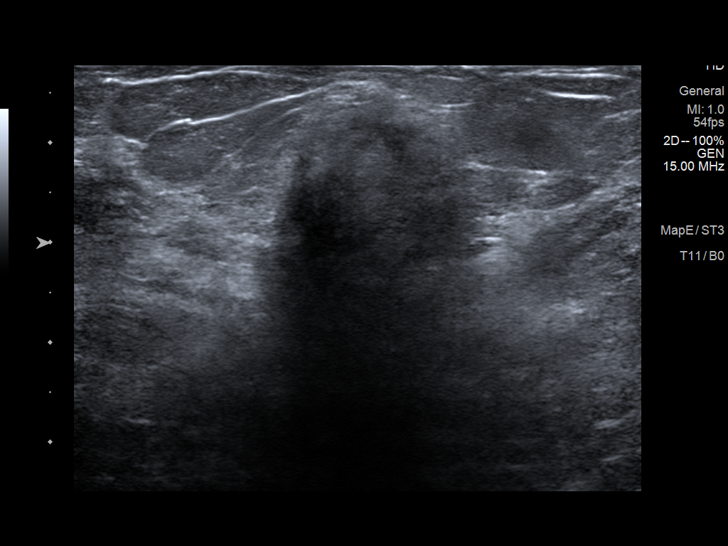
[im 5/14]
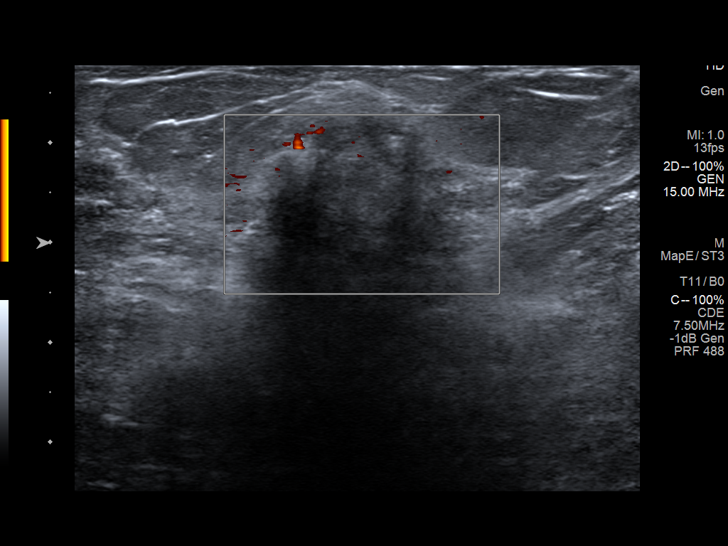
[im 6/14]
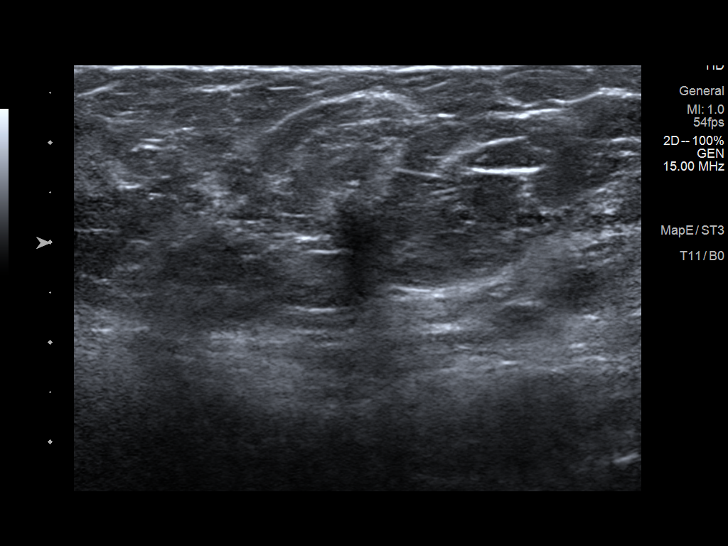
[im 7/14]
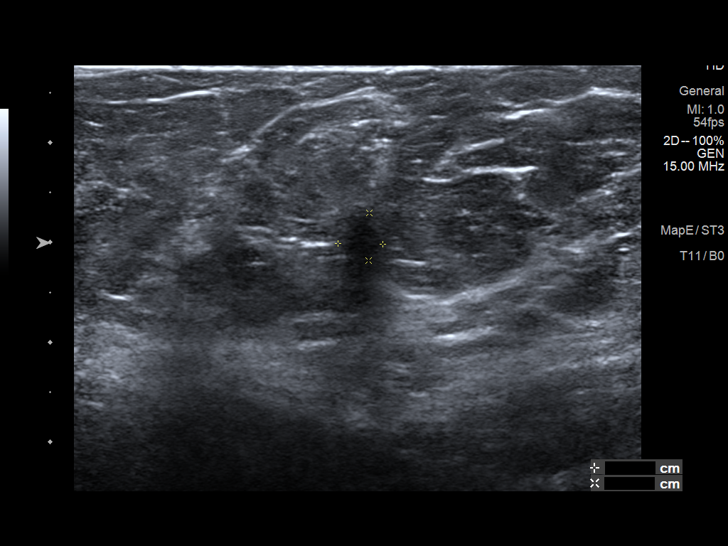
[im 8/14]
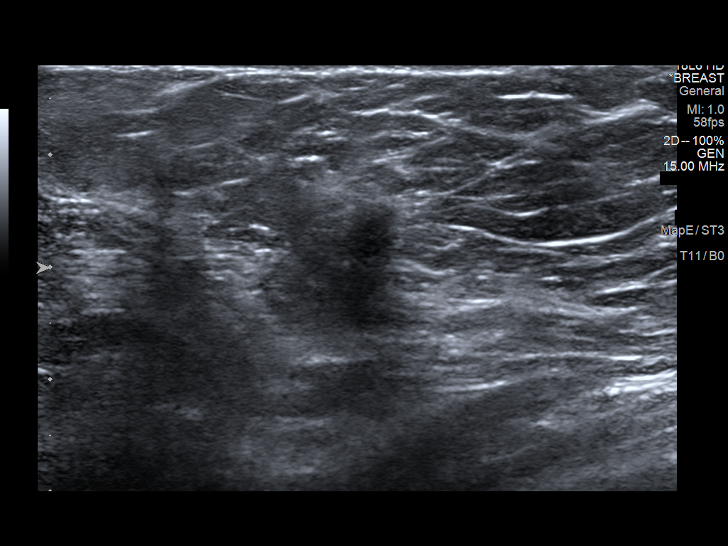
[im 9/14]
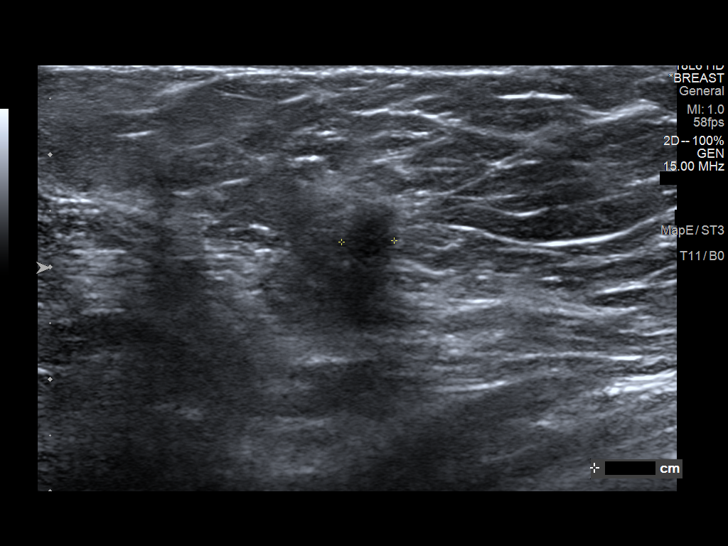
[im 10/14]
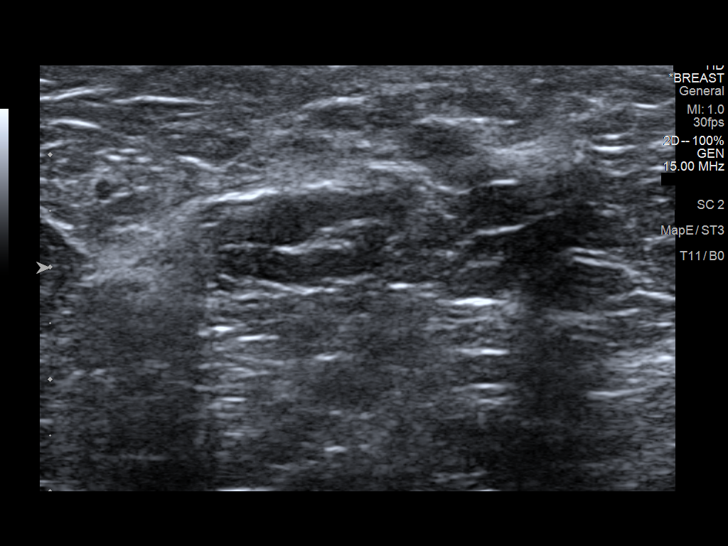
[im 12/14]
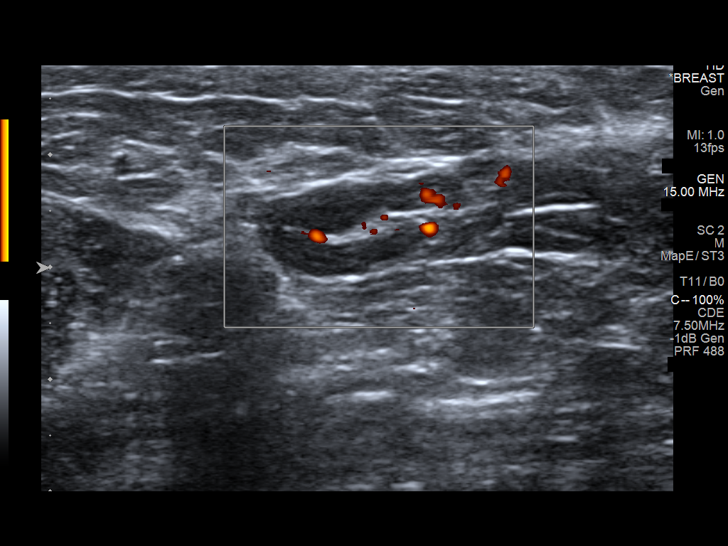
[im 13/14]
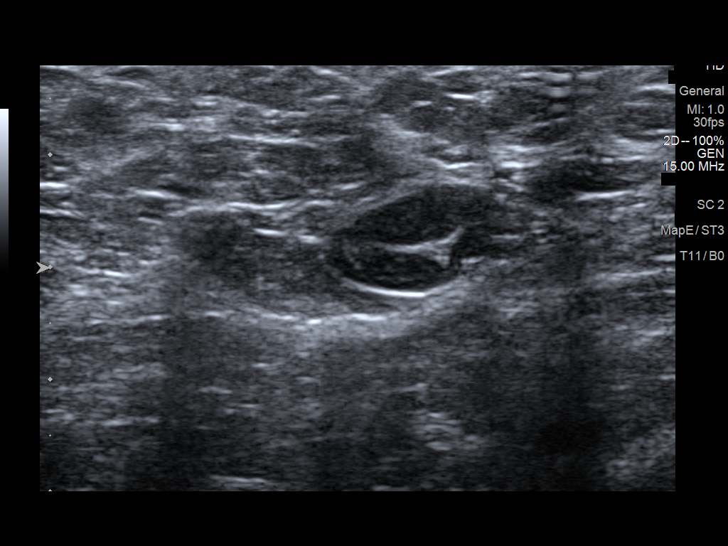
[im 14/14]
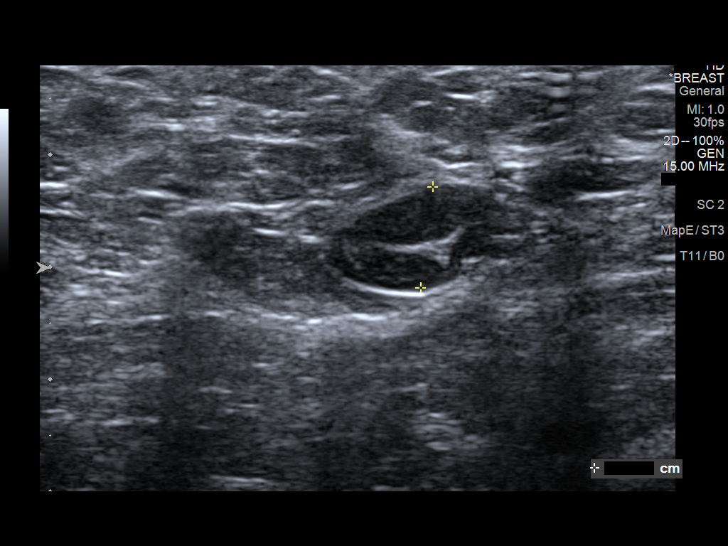

[12 of 14 positions shown; findings below may reference images not displayed]

ACR Breast Density Category b: There are scattered areas of
fibroglandular density.
FINDINGS: 3D tomographic images confirm an area of distortion in the far outer
left breast. On mammography, the area of distortion measures
approximately 4 cm greatest diameter. Possible prominent left
axillary lymph node noted.

Mammographic images were processed with CAD.

On physical exam, there is an approximately 4 cm area of palpable
firmness in the 2 o'clock position of the left breast approximately
12 cm from the nipple. No mass is palpated in the left axilla.

Targeted ultrasound is performed, showing an irregular hypoechoic
mass with indistinct margins and posterior acoustic shadowing at 2
o'clock position 12 cm from the nipple. On ultrasound, this area
measures approximately 2.3 x 2.1 x 2.6 cm.

Further lateral and closer to the axilla, at [DATE] position
approximately 12-14 cm from the nipple, is an unsuspected irregular
hypoechoic mass measuring 5 x 5 x 5 mm. It is difficult to image
this small mass simultaneously on the ultrasound screen as the
larger palpable mass.

In the right axilla, a lymph node with a thickened cortex and
partially effaced fatty hilum is imaged. This lymph node measures 9
mm short axis, and the cortex measures 4 mm. There is some vascular
flow within a portion of the cortex.
IMPRESSION: 1. Persistent area of architectural distortion and mass in the [DATE]
position left breast approximately 12 cm from the nipple. On
mammography the abnormality measures up to 4 cm, and on ultrasound
it measures 2.6 cm. Findings are suspicious for malignancy.
2. Separate 5 mm suspicious hypoechoic mass at [DATE] position
approximately 12-14 cm from the nipple.
3. Cortical thickening of the left axillary lymph node. This could
be reactive, or could reflect metastatic involvement.

:
A total of 3 ultrasound-guided biopsies are recommended, including 2
masses in the upper-outer quadrant of the left breast and a single
left axillary lymph node. Biopsies have been scheduled 08/13/2015 at
3 o'clock p.m..

I have discussed the findings and recommendations with the patient.
Results were also provided in writing at the conclusion of the
visit. If applicable, a reminder letter will be sent to the patient
regarding the next appointment.

BI-RADS CATEGORY  5: Highly suggestive of malignancy.

## 2017-10-17 ENCOUNTER — Other Ambulatory Visit: Payer: Self-pay | Admitting: Oncology

## 2017-10-27 IMAGING — CT CT CHEST W/ CM
2 of 5 series · 16 of 46 positions shown, 18 images · IV contrast (OMNIPAQUE)
Comparison: Obstetric ultrasound of 04/30/2011. Breast MR
07/21/2015.

CLINICAL DATA: New breast cancer diagnosis. Upper outer quadrant
left breast. Positive lymph node. Diagnosed [DATE].

EXAM:
CT CHEST, ABDOMEN, AND PELVIS WITH CONTRAST
TECHNIQUE: Multidetector CT imaging of the chest, abdomen and pelvis was
performed following the standard protocol during bolus
administration of intravenous contrast.
CONTRAST:  100mL OMNIPAQUE IOHEXOL 300 MG/ML  SOLN

[Series 2: cap with st · axial · 0.69mm/px · z∈[-600,-54]mm · 13 of 125 slices shown, 15 images]
[im 8/125  soft-tissue]
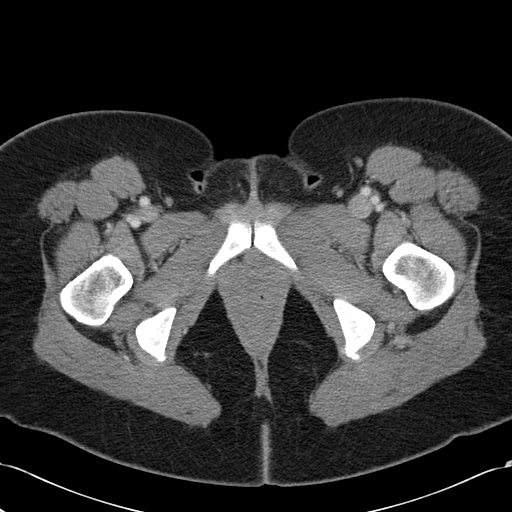
[im 8/125  bone]
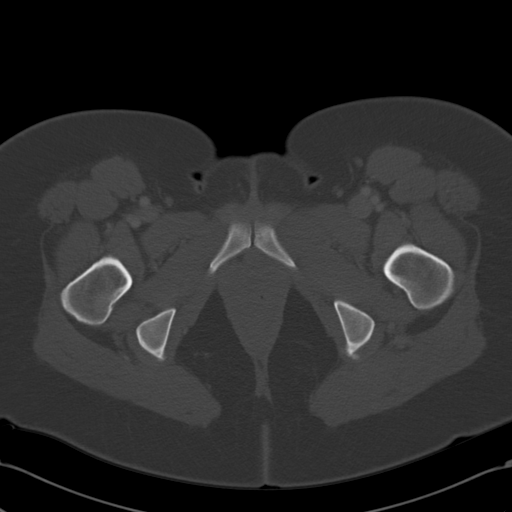
[im 15/125  soft-tissue]
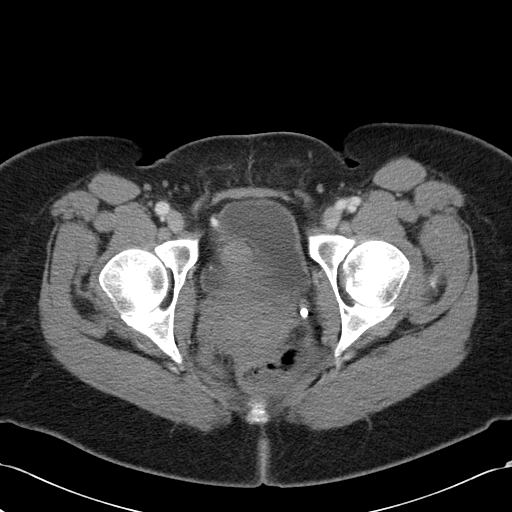
[im 30/125  soft-tissue]
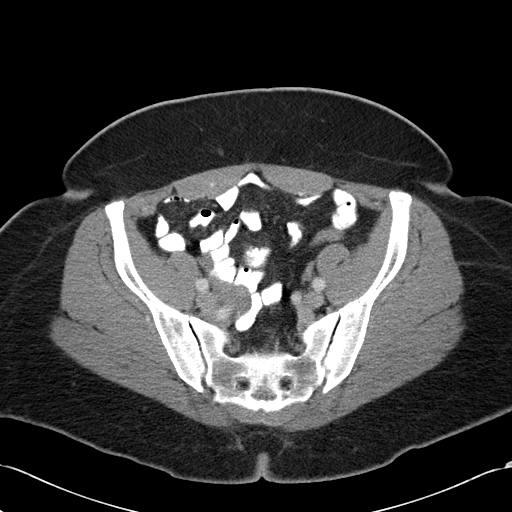
[im 37/125  soft-tissue]
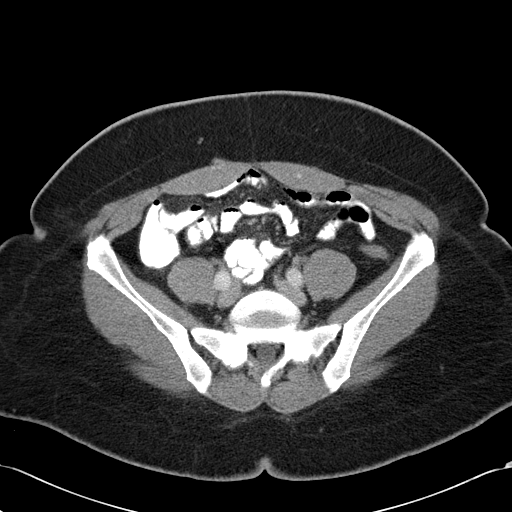
[im 44/125  soft-tissue]
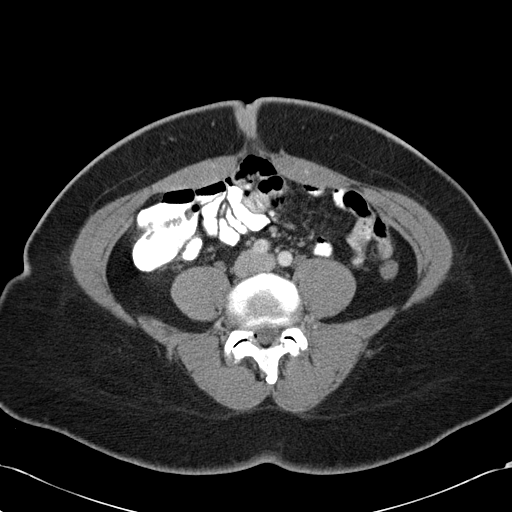
[im 52/125  soft-tissue]
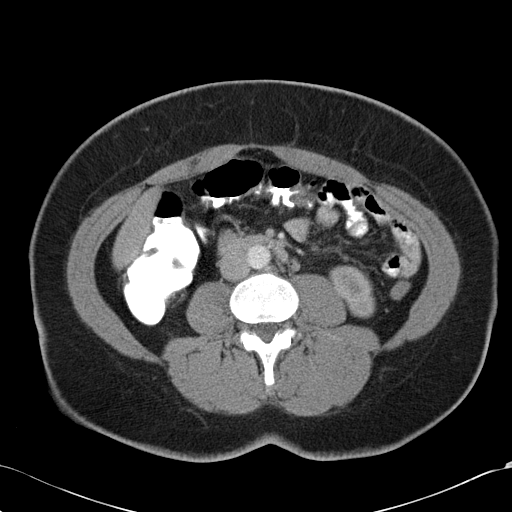
[im 66/125  soft-tissue]
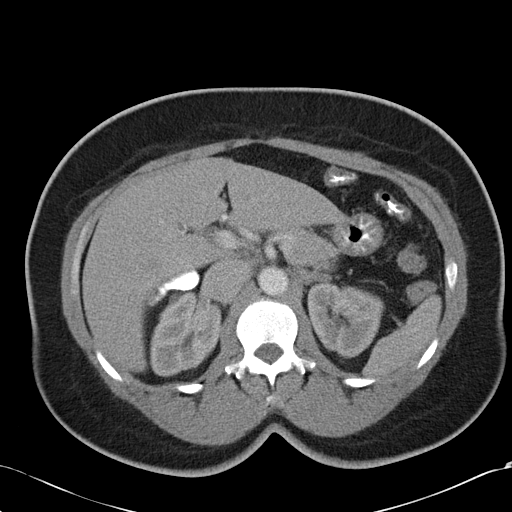
[im 73/125  soft-tissue]
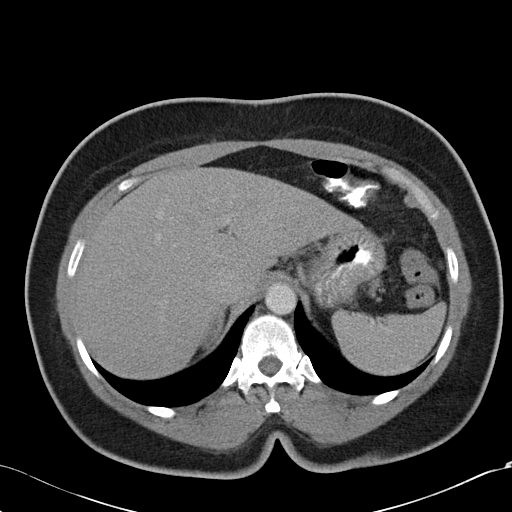
[im 81/125  soft-tissue]
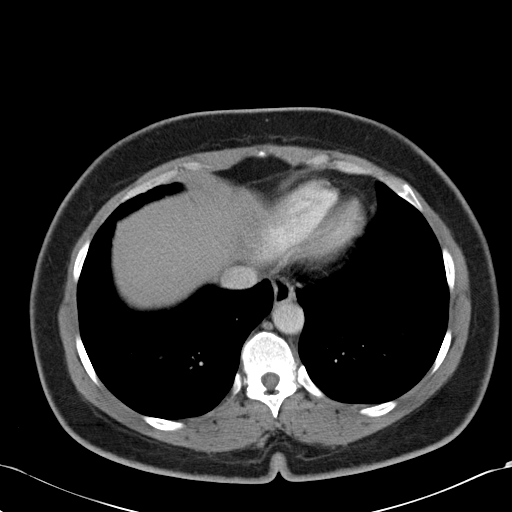
[im 81/125  bone]
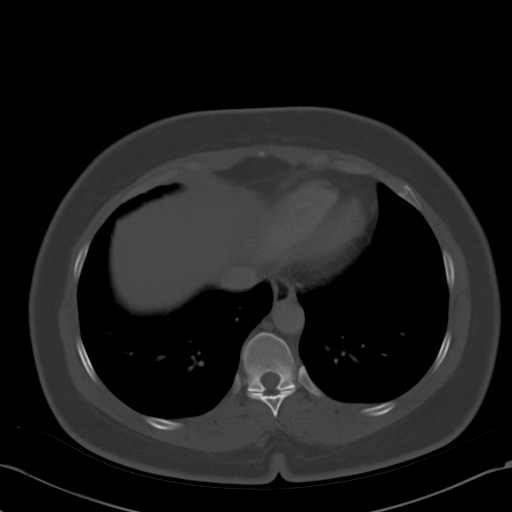
[im 88/125  soft-tissue]
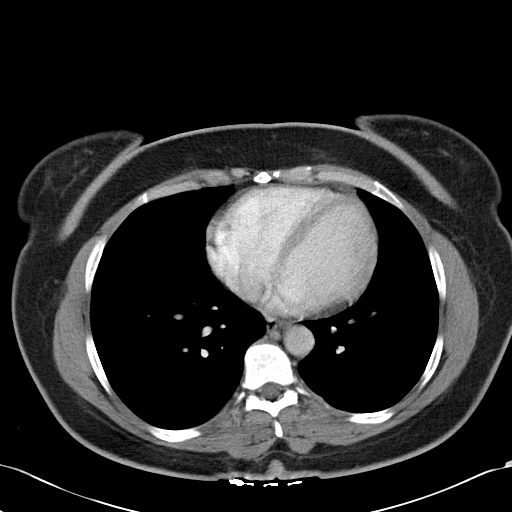
[im 95/125  soft-tissue]
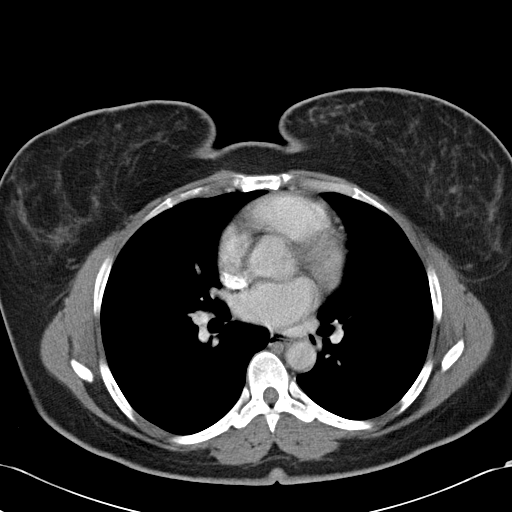
[im 110/125  soft-tissue]
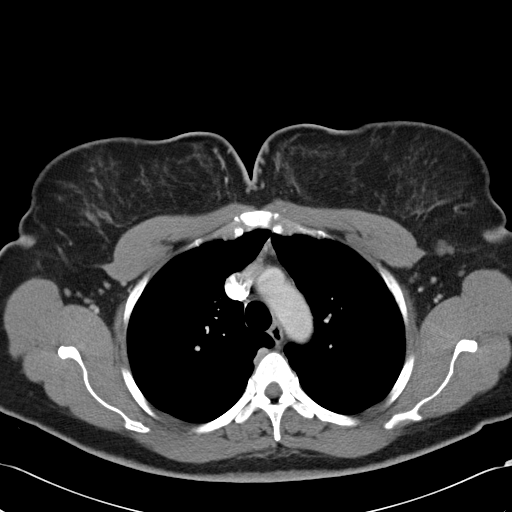
[im 117/125  soft-tissue]
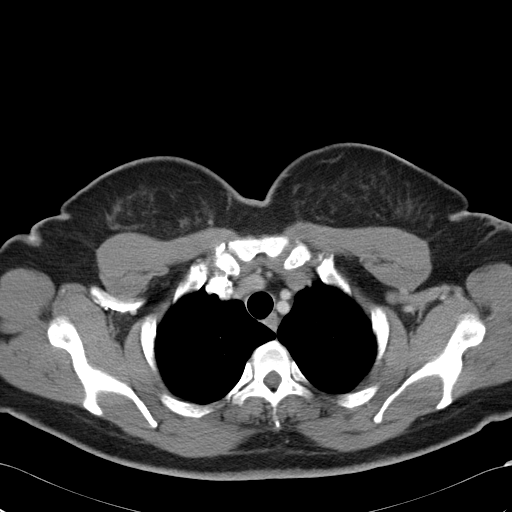

[Series 602: cor · coronal · 1.22mm/px · 3 of 87 slices shown]
[im 29/87  soft-tissue]
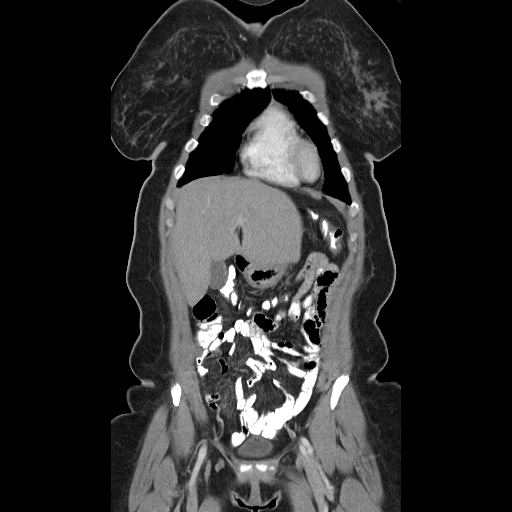
[im 39/87  soft-tissue]
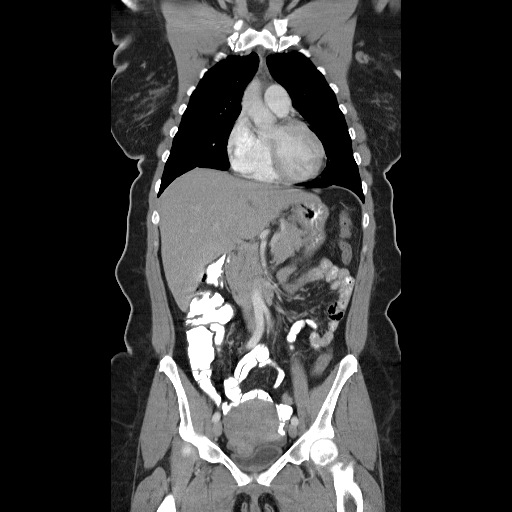
[im 48/87  soft-tissue]
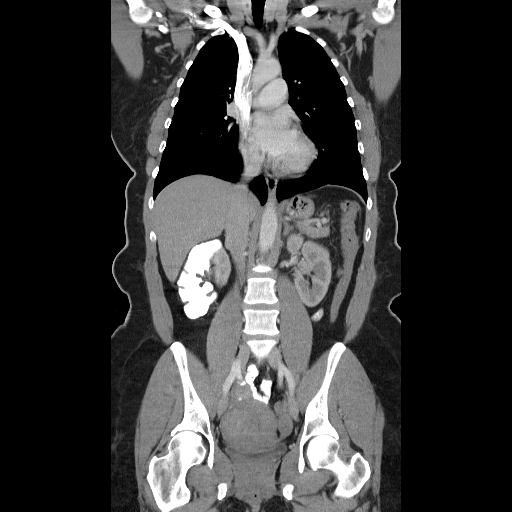

[16 of 46 positions shown; findings below may reference images not displayed]

FINDINGS: CT CHEST FINDINGS

Mediastinum/Lymph Nodes: No supraclavicular adenopathy.

Normal heart size, without pericardial effusion. No central
pulmonary embolism, on this non-dedicated study. No mediastinal or
hilar adenopathy. No internal mammary adenopathy.

Lungs/Pleura: No pleural fluid.  Clear lungs.

Musculoskeletal: Left breast primary, including on image 24/series 2
laterally.

Suspicious left axillary node measures 1.1 x 1.6 cm on image
15/series 2. No subpectoral adenopathy. No acute osseous
abnormality.

CT ABDOMEN PELVIS FINDINGS

Hepatobiliary: Vague focal steatosis adjacent the falciform
ligament. Normal gallbladder, without biliary ductal dilatation.

Pancreas: Normal, without mass or ductal dilatation.

Spleen: Normal in size, without focal abnormality.

Adrenals/Urinary Tract: Normal adrenal glands. Too small to
characterize lower pole right renal lesion. Normal left kidney,
without hydronephrosis. Normal urinary bladder.

Stomach/Bowel: Normal stomach, without wall thickening. Normal colon
and terminal ileum. Normal small bowel.

Vascular/Lymphatic: Normal caliber of the aorta and branch vessels.
No abdominopelvic adenopathy.

Reproductive: Lobular, heterogeneous uterus, likely related to
multiple fibroids. Suboptimally evaluated. Suspect a right sided
fundal lesion with possible submucosal extension. Example 5.6 x
cm on image 103/series 2.

No adnexal mass.

Other: No significant free fluid. No evidence of omental or
peritoneal disease.

Musculoskeletal: No acute osseous abnormality.
IMPRESSION: 1. Left breast primary with suspicious left axillary node, as on
prior MRI.
2. No evidence of distant metastatic disease.

## 2017-10-31 IMAGING — CR DG CHEST 1V PORT
1 series · 1 of 1 positions shown · non-contrast
Comparison: Chest CT 07/27/2015

CLINICAL DATA: 49-year-old female status post right chest porta
cath placement. Left breast cancer. Subsequent encounter.

EXAM:
PORTABLE CHEST 1 VIEW

[AP]
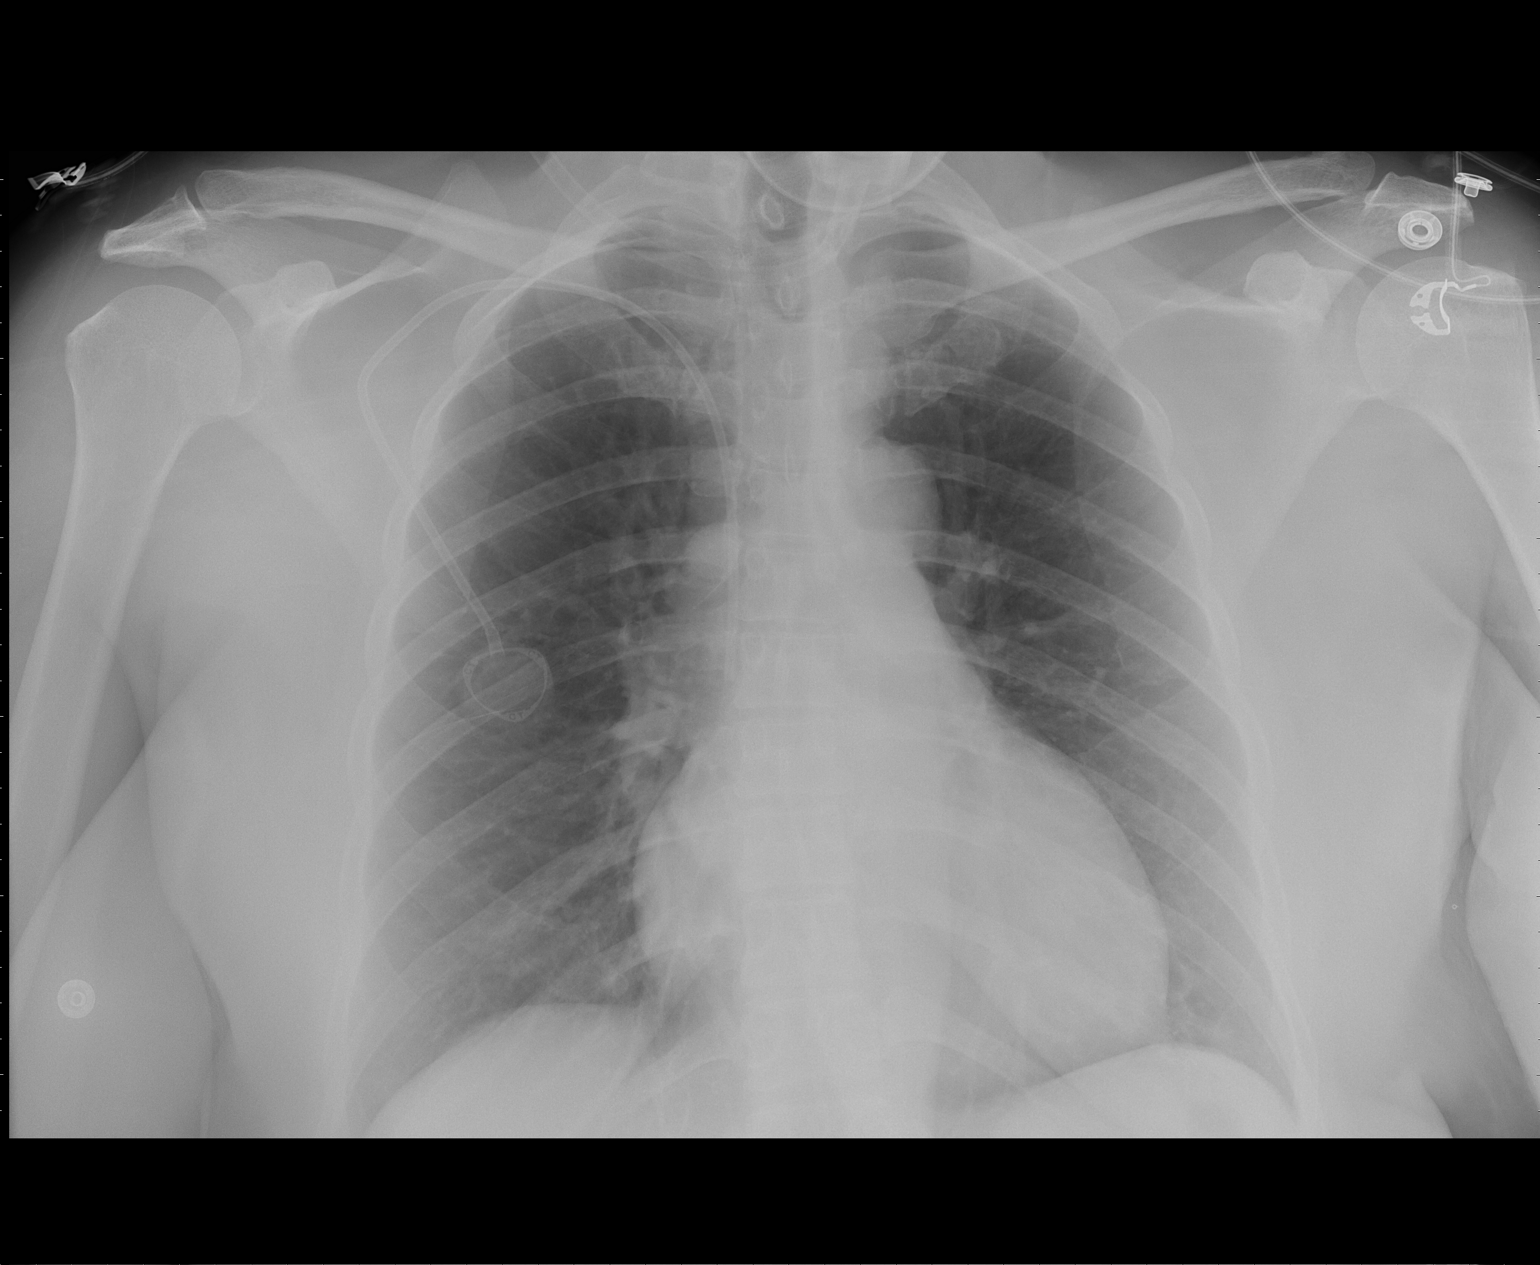

[1 of 1 positions shown; findings below may reference images not displayed]

FINDINGS: Seated upright AP portable view at 6375 hours. Right chest porta
cath now in place, subclavian approach catheter tip projects at the
lower SVC level. No pneumothorax. Allowing for portable technique,
the lungs are clear. Mediastinal contours are stable and within
normal limits. Visualized tracheal air column is within normal
limits. No osseous abnormality identified.
IMPRESSION: Right chest subclavian approach porta cath placed with no adverse
features.

## 2018-07-26 ENCOUNTER — Other Ambulatory Visit: Payer: Self-pay | Admitting: Oncology

## 2018-09-28 ENCOUNTER — Other Ambulatory Visit: Payer: Self-pay | Admitting: Surgery

## 2018-09-28 DIAGNOSIS — Z853 Personal history of malignant neoplasm of breast: Secondary | ICD-10-CM

## 2018-09-29 ENCOUNTER — Telehealth: Payer: Self-pay | Admitting: Oncology

## 2018-09-29 NOTE — Telephone Encounter (Signed)
Patient returned phone call regarding voicemail that was left, patient is aware of Webex appointment and e-mail has been sent.

## 2018-09-29 NOTE — Telephone Encounter (Signed)
Left vm for pt to call back re 09/30/18 appt to try to convert office visit into Webex mtg.

## 2018-09-29 NOTE — Progress Notes (Signed)
[facility]  Telephone:([phone] [phone]   ID: Emma [name] DOB: [birth year]  MR#: [id number]  HYQ#:[id number]  Patient Care Team: Patient, No Pcp Per as PCP - General (General Practice) [name], MD as Consulting Physician (General Surgery) Elyn Krogh, Valentino Hue, MD as Consulting Physician (Oncology) Antony Blackbird, MD as Consulting Physician (Radiation Oncology) Levi Aland, MD as Consulting Physician (Obstetrics and Gynecology) Lyanne Co, MD as Referring Physician (Radiology) Axel Filler Larna Daughters, NP as Nurse Practitioner (Hematology and Oncology) PCP: Patient, No Pcp Per OTHER MD:  I connected with [facility] on 09/30/18 at  1:00 PM EDT by video enabled telemedicine visit and verified that I am speaking with the correct person using two identifiers.   I discussed the limitations, risks, security and privacy concerns of performing an evaluation and management service by telemedicine and the availability of in-person appointments. I also discussed with the patient that there may be a patient responsible charge related to this service. The patient expressed understanding and agreed to proceed.   Other persons participating in the visit and their role in the encounter: Mickie Bail, scribe   Patient's location: home  Provider's location: [facility]    CHIEF COMPLAINT: Estrogen receptor positive breast cancer  CURRENT TREATMENT:  Tamoxifen   INTERVAL HISTORY: Emma [name] is seen today for follow-up of her estrogen receptor positive breast cancer.   She continues on tamoxifen, with good tolerance. She gets a couple of hot flashes during the night. She takes gabapentin at night for relief.   Since her last visit, she has not undergone any additional studies. She is scheduled for diagnostic bilateral mammogram on 10/29/2018.   REVIEW OF SYSTEMS: Emma [name] reports doing well overall. Her son, age 38, is doing remote  learning. Her husband works at the hospital, but unfortunately his hours have been cut. She reports she and her son will go biking, sometimes she just walks. She states she saw Dr. Ezzard Standing earlier in the year.  The patient denies unusual headaches, visual changes, nausea, vomiting, stiff neck, dizziness, or gait imbalance. There has been no cough, phlegm production, or pleurisy, no chest pain or pressure, and no change in bowel or bladder habits. The patient denies fever, rash, bleeding, unexplained fatigue or unexplained weight loss. A detailed review of systems was otherwise entirely negative.   BREAST CANCER HISTORY: From the original intake note:  Emma [name] had routine screening mammography at Dr. Ewell Poe office/[facility] 06/23/2015. There was an area of distortion in the left breast. The patient [facility] where on 07/03/2015 he underwent left diagnostic mammography with tomosynthesis and left breast ultrasonography. The breast density was category B. In the far outer left breast there was an area of distortion measuring approximately 4 cm. There appeared to be a prominent left axillary lymph node. On physical exam at the 2:00 position of the left breast 12 cm from the nipple there was an area of approximately 4 cm of firmness. There was no palpable mass in the left axilla. Ultrasonography confirmed an irregular hypoechoic mass in the upper-outer quadrant of the left breast measuring 2.6 cm. A little closer to the axilla there was a second irregular hypoechoic mass measuring 0.5 cm. The distance between these 2 masses was 3.6 cm. In addition there was a right axillary lymph node with a thickened cortex. It measured 0.9 cm.  On 5 or 20 11/30/2015 the patient underwent biopsy of the 2 areas in the breast as well  [place]  [phone] [phone]   ID: Emma [name] DOB: Oct 31, 1965  MR#: [id number]  WER#:[id number]  Patient Care Team: Patient, No Pcp Per as PCP - General (General Practice) Alphonsa Overall, MD as Consulting Physician (General Surgery) Dianelly Ferran, Virgie Dad, MD as Consulting Physician (Oncology) Gery Pray, MD as Consulting Physician (Radiation Oncology) Olga Millers, MD as Consulting Physician (Obstetrics and Gynecology) Basilio Cairo, MD as Referring Physician (Radiology) Delice Bison Charlestine Massed, NP as Nurse Practitioner (Hematology and Oncology) PCP: Patient, No Pcp Per OTHER MD:  I connected with [facility] on 09/30/18 at  1:00 PM EDT by video enabled telemedicine visit and verified that I am speaking with the correct person using two identifiers.   I discussed the limitations, risks, security and privacy concerns of performing an evaluation and management service by telemedicine and the availability of in-person appointments. I also discussed with the patient that there may be a patient responsible charge related to this service. The patient expressed understanding and agreed to proceed.   Other persons participating in the visit and their role in the encounter: Wilburn Mylar, scribe   Patient's location: home  Provider's location: [place]    CHIEF COMPLAINT: Estrogen receptor positive breast cancer  CURRENT TREATMENT:  Tamoxifen   INTERVAL HISTORY: Emma [name] is seen today for follow-up of her estrogen receptor positive breast cancer.   She continues on tamoxifen, with good tolerance. She gets a couple of hot flashes during the night. She takes gabapentin at night for relief.   Since her last visit, she has not undergone any additional studies. She is scheduled for diagnostic bilateral mammogram on 10/29/2018.   REVIEW OF SYSTEMS: Emma [name] reports doing well overall. Her son, age 10, is doing remote  learning. Her husband works at the hospital, but unfortunately his hours have been cut. She reports she and her son will go biking, sometimes she just walks. She states she saw Dr. Lucia Gaskins earlier in the year.  The patient denies unusual headaches, visual changes, nausea, vomiting, stiff neck, dizziness, or gait imbalance. There has been no cough, phlegm production, or pleurisy, no chest pain or pressure, and no change in bowel or bladder habits. The patient denies fever, rash, bleeding, unexplained fatigue or unexplained weight loss. A detailed review of systems was otherwise entirely negative.   BREAST CANCER HISTORY: From the original intake note:  Maris had routine screening mammography at Dr. Tonette Bihari office/[facility] 06/23/2015. There was an area of distortion in the left breast. The patient was then referred to the [facility] where on 07/03/2015 he underwent left diagnostic mammography with tomosynthesis and left breast ultrasonography. The breast density was category B. In the far outer left breast there was an area of distortion measuring approximately 4 cm. There appeared to be a prominent left axillary lymph node. On physical exam at the 2:00 position of the left breast 12 cm from the nipple there was an area of approximately 4 cm of firmness. There was no palpable mass in the left axilla. Ultrasonography confirmed an irregular hypoechoic mass in the upper-outer quadrant of the left breast measuring 2.6 cm. A little closer to the axilla there was a second irregular hypoechoic mass measuring 0.5 cm. The distance between these 2 masses was 3.6 cm. In addition there was a right axillary lymph node with a thickened cortex. It measured 0.9 cm.  On 5 or 20 11/30/2015 the patient underwent biopsy of the 2 areas in the breast as well  [place]  Telephone:([phone]) [phone] Fax:(336) [phone]   ID: Emma [name] DOB: Oct 31, 1965  MR#: [id number]  WER#:[id number]  Patient Care Team: Patient, No Pcp Per as PCP - General (General Practice) Alphonsa Overall, MD as Consulting Physician (General Surgery) Dianelly Ferran, Virgie Dad, MD as Consulting Physician (Oncology) Gery Pray, MD as Consulting Physician (Radiation Oncology) Olga Millers, MD as Consulting Physician (Obstetrics and Gynecology) Basilio Cairo, MD as Referring Physician (Radiology) Delice Bison Charlestine Massed, NP as Nurse Practitioner (Hematology and Oncology) PCP: Patient, No Pcp Per OTHER MD:  I connected with [facility] on 09/30/18 at  1:00 PM EDT by video enabled telemedicine visit and verified that I am speaking with the correct person using two identifiers.   I discussed the limitations, risks, security and privacy concerns of performing an evaluation and management service by telemedicine and the availability of in-person appointments. I also discussed with the patient that there may be a patient responsible charge related to this service. The patient expressed understanding and agreed to proceed.   Other persons participating in the visit and their role in the encounter: Wilburn Mylar, scribe   Patient's location: home  Provider's location: [place]    CHIEF COMPLAINT: Estrogen receptor positive breast cancer  CURRENT TREATMENT:  Tamoxifen   INTERVAL HISTORY: Emma [name] is seen today for follow-up of her estrogen receptor positive breast cancer.   She continues on tamoxifen, with good tolerance. She gets a couple of hot flashes during the night. She takes gabapentin at night for relief.   Since her last visit, she has not undergone any additional studies. She is scheduled for diagnostic bilateral mammogram on 10/29/2018.   REVIEW OF SYSTEMS: Emma [name] reports doing well overall. Her son, age 10, is doing remote  learning. Her husband works at the hospital, but unfortunately his hours have been cut. She reports she and her son will go biking, sometimes she just walks. She states she saw Dr. Lucia Gaskins earlier in the year.  The patient denies unusual headaches, visual changes, nausea, vomiting, stiff neck, dizziness, or gait imbalance. There has been no cough, phlegm production, or pleurisy, no chest pain or pressure, and no change in bowel or bladder habits. The patient denies fever, rash, bleeding, unexplained fatigue or unexplained weight loss. A detailed review of systems was otherwise entirely negative.   BREAST CANCER HISTORY: From the original intake note:  Maris had routine screening mammography at Dr. Tonette Bihari office/[facility] 06/23/2015. There was an area of distortion in the left breast. The patient was then referred to the [facility] where on 07/03/2015 he underwent left diagnostic mammography with tomosynthesis and left breast ultrasonography. The breast density was category B. In the far outer left breast there was an area of distortion measuring approximately 4 cm. There appeared to be a prominent left axillary lymph node. On physical exam at the 2:00 position of the left breast 12 cm from the nipple there was an area of approximately 4 cm of firmness. There was no palpable mass in the left axilla. Ultrasonography confirmed an irregular hypoechoic mass in the upper-outer quadrant of the left breast measuring 2.6 cm. A little closer to the axilla there was a second irregular hypoechoic mass measuring 0.5 cm. The distance between these 2 masses was 3.6 cm. In addition there was a right axillary lymph node with a thickened cortex. It measured 0.9 cm.  On 5 or 20 11/30/2015 the patient underwent biopsy of the 2 areas in the breast as well  No results found for: COLORURINE, APPEARANCEUR, LABSPEC, PHURINE, GLUCOSEU, HGBUR, BILIRUBINUR, KETONESUR, PROTEINUR, UROBILINOGEN, NITRITE, LEUKOCYTESUR   ELIGIBLE FOR AVAILABLE RESEARCH PROTOCOL: no  STUDIES: No results found.  ASSESSMENT: 53 y.o. [facility] woman status post left breastUpper outer quadrant biopsy 07/10/2015 of 2 separate masses as well as left axillary lymph node, all positive for invasive lobular carcinoma, grade 1, estrogen and progesterone receptor positive, HER-2 not amplified, with an MIB-1 between 5 and 20%  (1) neoadjuvan chemotherapy consisting of cyclophosphamide and docetaxel every 3 weeks 4, First dose 08/07/2015, final dose 10/12/2015  (2) status post left lumpectomy and axillary lymph node dissection 12/01/2015 for a residual pT2 pN1, stage IIA invasive lobular carcinoma, repeat prognostic panel again estrogen and progesterone receptor positive, HER-2 not amplified. Margins were close but negative   (3) adjuvant radiation at [facility], completed 03/07/2016 ----------------------------------------------------------------------------------------------------- LT breast/IMN, PWT 5000cGy 35 200cGy 9/14 - 02/28/16 ----------------------------------------------------------------------------------------------------- LT SCV/AX, RAO.LPO 5000cGy 35 200cGy 9/14 -  02/28/16 ----------------------------------------------------------------------------------------------------- LT TB boost, bouquet 1200cGy 8 200cGy 10/19 - 03/07/16  ----------------------------------------------------------------------------------------------------- Totals 6200cGy   (4) started tamoxifen 03/25/2016--plan is for 10 years on antiestrogen  (5) consider the PALLAS trial but never enrolled  PLAN: Jeniah is now nearly 3 years out from definitive surgery for breast cancer with no evidence of disease recurrence.  This is very favorable.  She is tolerating tamoxifen well and the plan will be to continue that a minimum of 5 years.  She will have her mammography in June.  I will see her again in September.  She will follow-up with her surgeon Dr. Ezzard Standing again in January  She is managing to exercise and to stay safe during the pandemic.  She knows to call for any other issue that may develop before the next visit.  Angeles Paolucci, Valentino Hue, MD  09/30/18 12:44 PM Medical Oncology and Hematology [facility] [address] [place], [place] [address] Tel. [phone]    Fax. [phone]   I, Mickie Bail, am acting as scribe for Dr. Valentino Hue. Alicya Bena.  I, Ruthann Cancer MD, have reviewed the above documentation for accuracy and completeness, and I agree with the above.

## 2018-09-30 ENCOUNTER — Other Ambulatory Visit: Payer: BC Managed Care – PPO

## 2018-09-30 ENCOUNTER — Inpatient Hospital Stay: Payer: Self-pay | Attending: Oncology | Admitting: Oncology

## 2018-09-30 DIAGNOSIS — Z79899 Other long term (current) drug therapy: Secondary | ICD-10-CM

## 2018-09-30 DIAGNOSIS — Z923 Personal history of irradiation: Secondary | ICD-10-CM

## 2018-09-30 DIAGNOSIS — Z170 Estrogen receptor positive status [ER+]: Secondary | ICD-10-CM

## 2018-09-30 DIAGNOSIS — I10 Essential (primary) hypertension: Secondary | ICD-10-CM

## 2018-09-30 DIAGNOSIS — C50412 Malignant neoplasm of upper-outer quadrant of left female breast: Secondary | ICD-10-CM

## 2018-09-30 DIAGNOSIS — Z79810 Long term (current) use of selective estrogen receptor modulators (SERMs): Secondary | ICD-10-CM

## 2018-09-30 DIAGNOSIS — Z9221 Personal history of antineoplastic chemotherapy: Secondary | ICD-10-CM

## 2018-10-01 ENCOUNTER — Telehealth: Payer: Self-pay | Admitting: Oncology

## 2018-10-01 NOTE — Telephone Encounter (Signed)
Tried to reach regarding schedule °

## 2018-10-29 ENCOUNTER — Ambulatory Visit
Admission: RE | Admit: 2018-10-29 | Discharge: 2018-10-29 | Disposition: A | Discharge status: Home / Self Care | Payer: BC Managed Care – PPO | Source: Ambulatory Visit | Attending: Surgery | Admitting: Surgery

## 2018-10-29 ENCOUNTER — Other Ambulatory Visit: Payer: Self-pay

## 2018-10-29 DIAGNOSIS — Z853 Personal history of malignant neoplasm of breast: Secondary | ICD-10-CM

## 2018-11-20 ENCOUNTER — Other Ambulatory Visit: Payer: Self-pay | Admitting: Oncology

## 2019-01-31 NOTE — Progress Notes (Signed)
Patient called today to cancel appointment because she is very concerned about being exposed to the coronavirus and then exposing family members.  We will reschedule her again for July of next year.

## 2019-02-01 ENCOUNTER — Inpatient Hospital Stay (HOSPITAL_BASED_OUTPATIENT_CLINIC_OR_DEPARTMENT_OTHER): Payer: BC Managed Care – PPO | Admitting: Oncology

## 2019-02-01 ENCOUNTER — Inpatient Hospital Stay: Payer: BC Managed Care – PPO

## 2019-02-01 DIAGNOSIS — C50412 Malignant neoplasm of upper-outer quadrant of left female breast: Secondary | ICD-10-CM

## 2019-02-01 DIAGNOSIS — Z170 Estrogen receptor positive status [ER+]: Secondary | ICD-10-CM

## 2019-02-04 ENCOUNTER — Telehealth: Payer: Self-pay | Admitting: Oncology

## 2019-02-04 NOTE — Telephone Encounter (Signed)
Returned patient's phone call regarding rescheduling 09/21 cancelled appointment, per patient's request appointment has been moved to 11/12.

## 2019-03-03 ENCOUNTER — Other Ambulatory Visit: Payer: Self-pay

## 2019-03-03 ENCOUNTER — Ambulatory Visit: Payer: Self-pay | Admitting: Oncology

## 2019-03-16 ENCOUNTER — Other Ambulatory Visit: Payer: Self-pay | Admitting: Oncology

## 2019-03-24 NOTE — Progress Notes (Signed)
[facility]  [phone] [phone]   ID: Emma [name] DOB: 1965/05/27  MR#: [id number]  VFI#:[id number]  Patient Care Team: Patient, No Pcp Per as PCP - General (General Practice) [name], MD as Consulting Physician (General Surgery) Andrian Urbach, Valentino Hue, MD as Consulting Physician (Oncology) Antony Blackbird, MD as Consulting Physician (Radiation Oncology) Levi Aland, MD as Consulting Physician (Obstetrics and Gynecology) Lyanne Co, MD as Referring Physician (Radiology) Axel Filler, Larna Daughters, NP as Nurse Practitioner (Hematology and Oncology) OTHER MD:   CHIEF COMPLAINT: Estrogen receptor positive breast cancer  CURRENT TREATMENT:  Tamoxifen   INTERVAL HISTORY: Emma [name] is seen today for follow-up of her estrogen receptor positive breast cancer.   She continues on tamoxifen.  She tolerates this remarkably well.  She is not having significant problems with hot flashes or vaginal wetness.  She obtains the drug under good price.  Since her last visit, she underwent bilateral diagnostic mammography with [facility] on [facility]2018 showing: breast density category B; no evidence of malignancy in either breast.   REVIEW OF SYSTEMS: Emma [name] is working mostly from home.  She does not like it.  She is exercising regularly by walking, she thinks perhaps 20 to 25 minutes a day.  She is taking appropriate pandemic precautions.  A detailed review of systems today was otherwise noncontributory   BREAST CANCER HISTORY: From the original intake note:  Emma [name] had routine screening mammography at [facility] 06/23/2015. There was an area of distortion in the left breast. [facility]2015 he underwent left diagnostic mammography with tomosynthesis and left breast ultrasonography. The breast density was category B. In the far outer left breast there  was an area of distortion measuring approximately 4 cm. There appeared to be a prominent left axillary lymph node. On physical exam at the 2:00 position of the left breast 12 cm from the nipple there was an area of approximately 4 cm of firmness. There was no palpable mass in the left axilla. Ultrasonography confirmed an irregular hypoechoic mass in the upper-outer quadrant of the left breast measuring 2.6 cm. A little closer to the axilla there was a second irregular hypoechoic mass measuring 0.5 cm. The distance between these 2 masses was 3.6 cm. In addition there was a right axillary lymph node with a thickened cortex. It measured 0.9 cm.  On 5 or 20 11/30/2015 the patient underwent biopsy of the 2 areas in the breast as well as the axillary lymph node. The larger of the 2 masses was an invasive lobular carcinoma, E-cadherin negative, estrogen receptor 100% positive, progesterone receptor 100% positive, both with strong staining intensity, with an MIB-1 of 20%. This second, smaller mass, was also an invasive lobular carcinoma, E-cadherin negative, estrogen receptor 90% positive, progesterone receptor 100% positive, both with strong staining intensity, with an MIB-1 of 5%. The right axillary lymph node was also an invasive mammary carcinoma. And also estrogen receptor positive at 70%, progesterone receptor positive at 90%, with an MIB-1 of 10%.  Her subsequent history is as detailed below   PAST MEDICAL HISTORY: Past Medical History:  Diagnosis Date  . Breast cancer of upper-outer quadrant of left female breast (HCC) 07/13/2015  . Heart murmur    "nothing to worry about"  . Hypertension   . Personal history of chemotherapy 2017  . Personal history of radiation therapy 2017    PAST SURGICAL HISTORY: Past Surgical History:  Procedure Laterality Date  . BREAST BIOPSY Left 07/10/2015  . BREAST LUMPECTOMY Left 12/01/2015   LEFT BREAST SEED GUIDED LUMPECTOMY WITH LEFT AXILLARY NODE DISSECTION  (Left)  . CESAREAN SECTION  1998  . COLONOSCOPY W/ POLYPECTOMY    . DILATION AND EVACUATION  05/01/2011   Procedure: DILATATION AND EVACUATION;  Surgeon: Levi Aland;  Location: [facility];  Service: Gynecology;  Laterality: N/A;  . FOOT SURGERY Bilateral 1993   bunionectomy  . PORT-A-CATH REMOVAL  12/01/2015  . PORT-A-CATH REMOVAL Right 12/01/2015   Procedure: REMOVAL PORT-A-CATH;  Surgeon: [name], MD;  Location: [facility] OR;  Service: General;  Laterality: Right;  . PORTACATH PLACEMENT Right 07/31/2015   Procedure: INSERTION PORT-A-CATH;  Surgeon: [name], MD;  Location: [facility];  Service: General;  Laterality: Right;  . RADIOACTIVE SEED GUIDED PARTIAL MASTECTOMY/AXILLARY SENTINEL NODE BIOPSY/AXILLARY NODE DISSECTION Left 12/01/2015   Procedure: LEFT BREAST SEED GUIDED LUMPECTOMY WITH LEFT AXILLARY NODE DISSECTION;  Surgeon: [name], MD;  Location: [facility] OR;  Service: General;  Laterality: Left;    FAMILY HISTORY Family History  Problem Relation Age of Onset  . Colon cancer Mother   The patient's father died at age 44 from "complicated causes". The patient's mother died from colon cancer at the age of 66. He cancer was diagnosed the year prior. The patient has 3 brothers, 11 sisters. There is no history of breast or ovarian cancer and no other colon cancers in the family   GYNECOLOGIC HISTORY:  Patient's last menstrual period was 09/08/2015. Menarche age 29, first live birth age 76, which the patient understands increases the risk of breast cancer. She is GX P2. She Was still having regular periods as of March 2017, but periods stopped during chemotherapy. So far there have not resumed. She used birth control remotely with no complications. Current birth control and include condom with spermicide, with a diaphragm to be added.   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 Her husband [name] works  for a Midwife is Chiropodist of environmental services. [name] has a child from a prior marriage, Pixie Rease, who is in the [employer]. Alizah as a child from a prior marriage, Shepard [employer], who is going to school in [place]. The couple have a son, [name], 58 years old as of May 2020, at home. The patient has one grandchild. She goes to a local [employer].    ADVANCED DIRECTIVES: no   HEALTH MAINTENANCE: Social History   Tobacco Use  . Smoking status: Never Smoker  . Smokeless tobacco: Never Used  Substance Use Topics  . Alcohol use: No    Comment: social  . Drug use: No     Colonoscopy: January 2016/[facility]  PAP: February 2017  Bone density: Never  Lipid panel:  No Known Allergies  Current Outpatient Medications  Medication Sig Dispense Refill  . amLODipine (NORVASC) 10 MG tablet Take 10 mg by mouth daily.  0  . gabapentin (NEURONTIN) 300 MG capsule Take 1 capsule (300 mg total) by mouth at bedtime. 90 capsule 4  . tamoxifen (NOLVADEX) 20 MG tablet TAKE 1 TABLET BY MOUTH EVERY DAY 90 tablet 0  . tamoxifen (NOLVADEX) 20 MG tablet TAKE 1 TABLET BY MOUTH EVERY DAY 90 tablet 0   No current facility-administered medications for this visit.     OBJECTIVE: Middle-aged African-American woman in no acute distress  Vitals:   03/25/19 1158  BP: 122/70  Pulse: 73  Resp: 18  Temp: 98.5 F (36.9 C)  SpO2: 97%     Body mass index is 37.96 kg/m.    ECOG FS:0 - Asymptomatic Filed Weights   03/25/19 1158  Weight: 235 lb 3.2 oz (106.7 kg)   Sclerae unicteric, EOMs intact Wearing a mask No cervical or supraclavicular adenopathy Lungs no rales or rhonchi Heart regular rate and rhythm Abd soft, nontender, positive bowel sounds MSK no focal spinal tenderness, no upper extremity lymphedema Neuro: nonfocal, well oriented, appropriate affect Breasts: The right breast is benign per the left breast is status post lumpectomy and radiation.  There  is no evidence of local recurrence.  Both axillae are benign.   LAB RESULTS:  CMP     Component Value Date/Time   NA 143 03/25/2019 1118   NA 144 09/24/2016 1414   K 4.0 03/25/2019 1118   K 3.8 09/24/2016 1414   CL 108 03/25/2019 1118   CO2 27 03/25/2019 1118   CO2 26 09/24/2016 1414   GLUCOSE 92 03/25/2019 1118   GLUCOSE 95 09/24/2016 1414   BUN 16 03/25/2019 1118   BUN 15.8 09/24/2016 1414   CREATININE 0.99 03/25/2019 1118   CREATININE 0.99 09/24/2017 1237   CREATININE 1.0 09/24/2016 1414   CALCIUM 9.0 03/25/2019 1118   CALCIUM 9.2 09/24/2016 1414   PROT 7.5 03/25/2019 1118   PROT 7.3 09/24/2016 1414   ALBUMIN 3.6 03/25/2019 1118   ALBUMIN 3.5 09/24/2016 1414   AST 19 03/25/2019 1118   AST 16 09/24/2017 1237   AST 18 09/24/2016 1414   ALT 13 03/25/2019 1118   ALT 13 09/24/2017 1237   ALT 14 09/24/2016 1414   ALKPHOS 63 03/25/2019 1118   ALKPHOS 62 09/24/2016 1414   BILITOT 0.3 03/25/2019 1118   BILITOT 0.3 09/24/2017 1237   BILITOT 0.38 09/24/2016 1414   GFRNONAA >60 03/25/2019 1118   GFRNONAA >60 09/24/2017 1237   GFRAA >60 03/25/2019 1118   GFRAA >60 09/24/2017 1237    INo results found for: SPEP, UPEP  Lab Results  Component Value Date   WBC 8.4 03/25/2019   NEUTROABS 4.2 03/25/2019   HGB 12.3 03/25/2019   HCT 39.4 03/25/2019   MCV 84.9 03/25/2019   PLT 253 03/25/2019      Chemistry      Component Value Date/Time   NA 143 03/25/2019 1118   NA 144 09/24/2016 1414   K 4.0 03/25/2019 1118   K 3.8 09/24/2016 1414   CL 108 03/25/2019 1118   CO2 27 03/25/2019 1118   CO2 26 09/24/2016 1414   BUN 16 03/25/2019 1118   BUN 15.8 09/24/2016 1414   CREATININE 0.99 03/25/2019 1118   CREATININE 0.99 09/24/2017 1237   CREATININE 1.0 09/24/2016 1414      Component Value Date/Time   CALCIUM 9.0 03/25/2019 1118   CALCIUM 9.2 09/24/2016 1414   ALKPHOS 63 03/25/2019 1118   ALKPHOS 62 09/24/2016 1414   AST 19 03/25/2019 1118   AST 16 09/24/2017 1237    AST 18 09/24/2016 1414   ALT 13 03/25/2019 1118   ALT 13 09/24/2017 1237   ALT 14 09/24/2016 1414   BILITOT 0.3 03/25/2019 1118   BILITOT 0.3 09/24/2017 1237   BILITOT 0.38 09/24/2016 1414      No results found for: LABCA2  No components found for: LABCA125  No results for input(s): INR in the last 168 hours.  Urinalysis No results found for: COLORURINE, APPEARANCEUR,  LABSPEC, PHURINE, GLUCOSEU, HGBUR, BILIRUBINUR, KETONESUR, PROTEINUR, UROBILINOGEN, NITRITE, LEUKOCYTESUR   ELIGIBLE FOR AVAILABLE RESEARCH PROTOCOL: no  STUDIES: No results found.  ASSESSMENT: 53 y.o. Emma [name] woman status post left breastUpper outer quadrant biopsy 07/10/2015 of 2 separate masses as well as left axillary lymph node, all positive for invasive lobular carcinoma, grade 1, estrogen and progesterone receptor positive, HER-2 not amplified, with an MIB-1 between 5 and 20%  (1) neoadjuvan chemotherapy consisting of cyclophosphamide and docetaxel every 3 weeks 4, First dose 08/07/2015, final dose 10/12/2015  (2) status post left lumpectomy and axillary lymph node dissection 12/01/2015 for a residual pT2 pN1, stage IIA invasive lobular carcinoma, repeat prognostic panel again estrogen and progesterone receptor positive, HER-2 not amplified. Margins were close but negative   (3) adjuvant [facility], completed 03/07/2016 ----------------------------------------------------------------------------------------------------- LT breast/IMN, PWT 5000cGy 35 200cGy 9/14 - 02/28/16 ----------------------------------------------------------------------------------------------------- LT SCV/AX, RAO.LPO 5000cGy 35 200cGy 9/14 - 02/28/16 ----------------------------------------------------------------------------------------------------- LT TB boost, bouquet 1200cGy 8 200cGy 10/19 - 03/07/16   ----------------------------------------------------------------------------------------------------- Totals 6200cGy   (4) started tamoxifen 03/25/2016--plan is for 10 years on antiestrogens  (5) consider the PALLAS trial but never enrolled  PLAN: Emma [name] is now 3-1/2 years out from definitive surgery for breast cancer with no evidence of disease recurrence.  This is very favorable.  She is tolerating tamoxifen well.  The plan is to continue that for a total of 10 years.  She will return to see me in 1 year.  She knows to call for any other issue that may develop before that visit.   Emma [name], Valentino Hue, MD  03/25/19 5:36 PM Medical Oncology and Hematology [facility] [address] [place], [place] [address] Tel. [phone]    Fax. [phone]   I, Mickie Bail, am acting as scribe for Dr. Valentino Hue. Emma [name].  I, Ruthann Cancer MD, have reviewed the above documentation for accuracy and completeness, and I agree with the above.

## 2019-03-25 ENCOUNTER — Inpatient Hospital Stay: Payer: 59 | Attending: Oncology

## 2019-03-25 ENCOUNTER — Other Ambulatory Visit: Payer: Self-pay

## 2019-03-25 ENCOUNTER — Inpatient Hospital Stay (HOSPITAL_BASED_OUTPATIENT_CLINIC_OR_DEPARTMENT_OTHER): Payer: 59 | Admitting: Oncology

## 2019-03-25 VITALS — BP 122/70 | HR 73 | Temp 98.5°F | Resp 18 | Ht 66.0 in | Wt 235.2 lb

## 2019-03-25 DIAGNOSIS — Z79810 Long term (current) use of selective estrogen receptor modulators (SERMs): Secondary | ICD-10-CM | POA: Diagnosis not present

## 2019-03-25 DIAGNOSIS — C50411 Malignant neoplasm of upper-outer quadrant of right female breast: Secondary | ICD-10-CM | POA: Insufficient documentation

## 2019-03-25 DIAGNOSIS — C50412 Malignant neoplasm of upper-outer quadrant of left female breast: Secondary | ICD-10-CM | POA: Diagnosis not present

## 2019-03-25 DIAGNOSIS — Z800 Family history of malignant neoplasm of digestive organs: Secondary | ICD-10-CM | POA: Diagnosis not present

## 2019-03-25 DIAGNOSIS — Z923 Personal history of irradiation: Secondary | ICD-10-CM | POA: Insufficient documentation

## 2019-03-25 DIAGNOSIS — Z9012 Acquired absence of left breast and nipple: Secondary | ICD-10-CM | POA: Diagnosis not present

## 2019-03-25 DIAGNOSIS — C773 Secondary and unspecified malignant neoplasm of axilla and upper limb lymph nodes: Secondary | ICD-10-CM | POA: Diagnosis not present

## 2019-03-25 DIAGNOSIS — Z9221 Personal history of antineoplastic chemotherapy: Secondary | ICD-10-CM | POA: Diagnosis not present

## 2019-03-25 DIAGNOSIS — Z170 Estrogen receptor positive status [ER+]: Secondary | ICD-10-CM | POA: Diagnosis not present

## 2019-03-25 DIAGNOSIS — Z79899 Other long term (current) drug therapy: Secondary | ICD-10-CM | POA: Insufficient documentation

## 2019-03-25 LAB — CBC WITH DIFFERENTIAL/PLATELET
Abs Immature Granulocytes: 0.04 10*3/uL (ref 0.00–0.07)
Basophils Absolute: 0 10*3/uL (ref 0.0–0.1)
Basophils Relative: 0 %
Eosinophils Absolute: 0.1 10*3/uL (ref 0.0–0.5)
Eosinophils Relative: 1 %
HCT: 39.4 % (ref 36.0–46.0)
Hemoglobin: 12.3 g/dL (ref 12.0–15.0)
Immature Granulocytes: 1 %
Lymphocytes Relative: 42 %
Lymphs Abs: 3.5 10*3/uL (ref 0.7–4.0)
MCH: 26.5 pg (ref 26.0–34.0)
MCHC: 31.2 g/dL (ref 30.0–36.0)
MCV: 84.9 fL (ref 80.0–100.0)
Monocytes Absolute: 0.5 10*3/uL (ref 0.1–1.0)
Monocytes Relative: 6 %
Neutro Abs: 4.2 10*3/uL (ref 1.7–7.7)
Neutrophils Relative %: 50 %
Platelets: 253 10*3/uL (ref 150–400)
RBC: 4.64 MIL/uL (ref 3.87–5.11)
RDW: 14.9 % (ref 11.5–15.5)
WBC: 8.4 10*3/uL (ref 4.0–10.5)
nRBC: 0 % (ref 0.0–0.2)

## 2019-03-25 LAB — COMPREHENSIVE METABOLIC PANEL
ALT: 13 U/L (ref 0–44)
AST: 19 U/L (ref 15–41)
Albumin: 3.6 g/dL (ref 3.5–5.0)
Alkaline Phosphatase: 63 U/L (ref 38–126)
Anion gap: 8 (ref 5–15)
BUN: 16 mg/dL (ref 6–20)
CO2: 27 mmol/L (ref 22–32)
Calcium: 9 mg/dL (ref 8.9–10.3)
Chloride: 108 mmol/L (ref 98–111)
Creatinine, Ser: 0.99 mg/dL (ref 0.44–1.00)
GFR calc Af Amer: >60 mL/min (ref >60)
GFR calc non Af Amer: >60 mL/min (ref >60)
Glucose, Bld: 92 mg/dL (ref 70–99)
Potassium: 4 mmol/L (ref 3.5–5.1)
Sodium: 143 mmol/L (ref 135–145)
Total Bilirubin: 0.3 mg/dL (ref 0.3–1.2)
Total Protein: 7.5 g/dL (ref 6.5–8.1)

## 2019-07-20 ENCOUNTER — Other Ambulatory Visit: Payer: Self-pay | Admitting: Oncology

## 2019-11-29 ENCOUNTER — Other Ambulatory Visit: Payer: Self-pay | Admitting: Oncology

## 2019-12-10 ENCOUNTER — Other Ambulatory Visit: Payer: Self-pay | Admitting: Surgery

## 2019-12-10 DIAGNOSIS — Z9889 Other specified postprocedural states: Secondary | ICD-10-CM

## 2020-01-07 ENCOUNTER — Ambulatory Visit
Admission: RE | Admit: 2020-01-07 | Discharge: 2020-01-07 | Disposition: A | Discharge status: Home / Self Care | Payer: 59 | Source: Ambulatory Visit | Attending: Surgery | Admitting: Surgery

## 2020-01-07 ENCOUNTER — Other Ambulatory Visit: Payer: Self-pay

## 2020-01-07 DIAGNOSIS — Z9889 Other specified postprocedural states: Secondary | ICD-10-CM

## 2020-02-28 NOTE — Progress Notes (Signed)
[facility]  Telephone:([phone] Fax:([phone]) [phone]   ID: Nicole Cella DOB: 1966/04/23  MR#: [id number]  VHQ#:[id number]  Patient Care Team: [facility], [facility] as PCP - General (Family Medicine) [name], MD as Consulting Physician (General Surgery) Baelyn Doring, Valentino Hue, MD as Consulting Physician (Oncology) Antony Blackbird, MD as Consulting Physician (Radiation Oncology) Levi Aland, MD as Consulting Physician (Obstetrics and Gynecology) Lyanne Co, MD as Referring Physician (Radiology) Axel Filler, Larna Daughters, NP as Nurse Practitioner (Hematology and Oncology) OTHER MD:   CHIEF COMPLAINT: Estrogen receptor positive breast cancer  CURRENT TREATMENT:  Tamoxifen   INTERVAL HISTORY: Charlie was scheduled today for follow-up of her estrogen receptor positive breast cancer, however she did not show.     Since her last visit, she underwent bilateral diagnostic mammography [facility]2020 showing: breast density category B; no evidence of malignancy in either breast.   REVIEW OF SYSTEMS: Charlcie    BREAST CANCER HISTORY: From the original intake note:  Xia had routine screening mammography at Dr. Ewell Poe office/[facility] 06/23/2015. There was an area of distortion in the left breast. The [facility] on 07[facility]/2015 he underwent left diagnostic mammography with tomosynthesis and left breast ultrasonography. The breast density was category B. In the far outer left breast there was an area of distortion measuring approximately 4 cm. There appeared to be a prominent left axillary lymph node. On physical exam at the 2:00 position of the left breast 12 cm from the nipple there was an area of approximately 4 cm of firmness. There was no palpable mass in the left axilla. Ultrasonography confirmed an irregular hypoechoic mass in the upper-outer  quadrant of the left breast measuring 2.6 cm. A little closer to the axilla there was a second irregular hypoechoic mass measuring 0.5 cm. The distance between these 2 masses was 3.6 cm. In addition there was a right axillary lymph node with a thickened cortex. It measured 0.9 cm.  On 5 or 20 11/30/2015 the patient underwent biopsy of the 2 areas in the breast as well as the axillary lymph node. The larger of the 2 masses was an invasive lobular carcinoma, E-cadherin negative, estrogen receptor 100% positive, progesterone receptor 100% positive, both with strong staining intensity, with an MIB-1 of 20%. This second, smaller mass, was also an invasive lobular carcinoma, E-cadherin negative, estrogen receptor 90% positive, progesterone receptor 100% positive, both with strong staining intensity, with an MIB-1 of 5%. The right axillary lymph node was also an invasive mammary carcinoma. And also estrogen receptor positive at 70%, progesterone receptor positive at 90%, with an MIB-1 of 10%.  Her subsequent history is as detailed below   PAST MEDICAL HISTORY: Past Medical History:  Diagnosis Date  . Breast cancer of upper-outer quadrant of left female breast (HCC) 07/13/2015  . Heart murmur    "nothing to worry about"  . Hypertension   . Personal history of chemotherapy 2017  . Personal history of radiation therapy 2017    PAST SURGICAL HISTORY: Past Surgical History:  Procedure Laterality Date  . BREAST BIOPSY Left 07/10/2015  . BREAST LUMPECTOMY Left 12/01/2015   LEFT BREAST SEED GUIDED LUMPECTOMY WITH LEFT AXILLARY NODE DISSECTION (Left)  . CESAREAN SECTION  1998  . COLONOSCOPY W/ POLYPECTOMY    . DILATION AND EVACUATION  05/01/2011   Procedure: DILATATION AND EVACUATION;  Surgeon: Levi Aland;  Location: [facility];  Service: Gynecology;  Laterality: N/A;  . FOOT SURGERY Bilateral 1993   bunionectomy  . PORT-A-CATH REMOVAL  12/01/2015  . PORT-A-CATH REMOVAL Right 12/01/2015   Procedure:  REMOVAL PORT-A-CATH;  Surgeon: [name], MD;  Location: [facility] OR;  Service: General;  Laterality: Right;  . PORTACATH PLACEMENT Right 07/31/2015   Procedure: INSERTION PORT-A-CATH;  Surgeon: [name], MD;  Location: [facility];  Service: General;  Laterality: Right;  . RADIOACTIVE SEED GUIDED PARTIAL MASTECTOMY/AXILLARY SENTINEL NODE BIOPSY/AXILLARY NODE DISSECTION Left 12/01/2015   Procedure: LEFT BREAST SEED GUIDED LUMPECTOMY WITH LEFT AXILLARY NODE DISSECTION;  Surgeon: [name], MD;  Location: [facility] OR;  Service: General;  Laterality: Left;    FAMILY HISTORY Family History  Problem Relation Age of Onset  . Colon cancer Mother   The patient's father died at age 50 from "complicated causes". The patient's mother died from colon cancer at the age of 41. He cancer was diagnosed the year prior. The patient has 3 brothers, 11 sisters. There is no history of breast or ovarian cancer and no other colon cancers in the family   GYNECOLOGIC HISTORY:  Patient's last menstrual period was 09/08/2015. Menarche age 62, first live birth age 61, which the patient understands increases the risk of breast cancer. She is GX P2. She Was still having regular periods as of March 2017, but periods stopped during chemotherapy. So far there have not resumed. She used birth control remotely with no complications. Current birth control and include condom with spermicide, with a diaphragm to be added.   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 Her husband [name] works for a Midwife is Chiropodist of environmental services. [name] has a child from a prior marriage, Maryana Gignac, who is in the [employer]. Jemila as a child from a prior marriage, [employer], who is going to school in [place]. The couple have a son, [name], 61 years old as of May 2020, at home. The patient has one grandchild. She goes to a local  [employer].    ADVANCED DIRECTIVES: no   HEALTH MAINTENANCE: Social History   Tobacco Use  . Smoking status: Never Smoker  . Smokeless tobacco: Never Used  Substance Use Topics  . Alcohol use: No    Comment: social  . Drug use: No     Colonoscopy: January 2016/[facility]  PAP: February 2017  Bone density: Never  Lipid panel:  No Known Allergies  Current Outpatient Medications  Medication Sig Dispense Refill  . amLODipine (NORVASC) 10 MG tablet Take 10 mg by mouth daily.  0  . gabapentin (NEURONTIN) 300 MG capsule Take 1 capsule (300 mg total) by mouth at bedtime. 90 capsule 4  . tamoxifen (NOLVADEX) 20 MG tablet TAKE 1 TABLET BY MOUTH EVERY DAY 90 tablet 0  . tamoxifen (NOLVADEX) 20 MG tablet TAKE 1 TABLET BY MOUTH EVERY DAY 90 tablet 0   No current facility-administered medications for this visit.    OBJECTIVE:   There were no vitals filed for this visit.   There is no height or weight on file to calculate BMI.    ECOG FS: There were no vitals filed for this visit.   LAB RESULTS:  CMP     Component Value Date/Time   NA 143 03/25/2019 1118   NA 144 09/24/2016 1414   K 4.0 03/25/2019 1118   K 3.8 09/24/2016 1414   CL 108 03/25/2019 1118  CO2 27 03/25/2019 1118   CO2 26 09/24/2016 1414   GLUCOSE 92 03/25/2019 1118   GLUCOSE 95 09/24/2016 1414   BUN 16 03/25/2019 1118   BUN 15.8 09/24/2016 1414   CREATININE 0.99 03/25/2019 1118   CREATININE 0.99 09/24/2017 1237   CREATININE 1.0 09/24/2016 1414   CALCIUM 9.0 03/25/2019 1118   CALCIUM 9.2 09/24/2016 1414   PROT 7.5 03/25/2019 1118   PROT 7.3 09/24/2016 1414   ALBUMIN 3.6 03/25/2019 1118   ALBUMIN 3.5 09/24/2016 1414   AST 19 03/25/2019 1118   AST 16 09/24/2017 1237   AST 18 09/24/2016 1414   ALT 13 03/25/2019 1118   ALT 13 09/24/2017 1237   ALT 14 09/24/2016 1414   ALKPHOS 63 03/25/2019 1118   ALKPHOS 62 09/24/2016 1414   BILITOT 0.3 03/25/2019 1118   BILITOT 0.3 09/24/2017 1237    BILITOT 0.38 09/24/2016 1414   GFRNONAA >60 03/25/2019 1118   GFRNONAA >60 09/24/2017 1237   GFRAA >60 03/25/2019 1118   GFRAA >60 09/24/2017 1237    INo results found for: SPEP, UPEP  Lab Results  Component Value Date   WBC 8.4 03/25/2019   NEUTROABS 4.2 03/25/2019   HGB 12.3 03/25/2019   HCT 39.4 03/25/2019   MCV 84.9 03/25/2019   PLT 253 03/25/2019      Chemistry      Component Value Date/Time   NA 143 03/25/2019 1118   NA 144 09/24/2016 1414   K 4.0 03/25/2019 1118   K 3.8 09/24/2016 1414   CL 108 03/25/2019 1118   CO2 27 03/25/2019 1118   CO2 26 09/24/2016 1414   BUN 16 03/25/2019 1118   BUN 15.8 09/24/2016 1414   CREATININE 0.99 03/25/2019 1118   CREATININE 0.99 09/24/2017 1237   CREATININE 1.0 09/24/2016 1414      Component Value Date/Time   CALCIUM 9.0 03/25/2019 1118   CALCIUM 9.2 09/24/2016 1414   ALKPHOS 63 03/25/2019 1118   ALKPHOS 62 09/24/2016 1414   AST 19 03/25/2019 1118   AST 16 09/24/2017 1237   AST 18 09/24/2016 1414   ALT 13 03/25/2019 1118   ALT 13 09/24/2017 1237   ALT 14 09/24/2016 1414   BILITOT 0.3 03/25/2019 1118   BILITOT 0.3 09/24/2017 1237   BILITOT 0.38 09/24/2016 1414      No results found for: LABCA2  No components found for: LABCA125  No results for input(s): INR in the last 168 hours.  Urinalysis No results found for: COLORURINE, APPEARANCEUR, LABSPEC, PHURINE, GLUCOSEU, HGBUR, BILIRUBINUR, KETONESUR, PROTEINUR, UROBILINOGEN, NITRITE, LEUKOCYTESUR   ELIGIBLE FOR AVAILABLE RESEARCH PROTOCOL: no  STUDIES: No results found.    ASSESSMENT: 54 y.o. [facility] woman status post left breastUpper outer quadrant biopsy 07/10/2015 of 2 separate masses as well as left axillary lymph node, all positive for invasive lobular carcinoma, grade 1, estrogen and progesterone receptor positive, HER-2 not amplified, with an MIB-1 between 5 and 20%  (1) neoadjuvan chemotherapy consisting of cyclophosphamide and  docetaxel every 3 weeks 4, First dose 08/07/2015, final dose 10/12/2015  (2) status post left lumpectomy and axillary lymph node dissection 12/01/2015 for a residual pT2 pN1, stage IIA invasive lobular carcinoma, repeat prognostic panel again estrogen and progesterone receptor positive, HER-2 not amplified. Margins were close but negative   (3) adjuvant radiation at [facility], completed 03/07/2016 ----------------------------------------------------------------------------------------------------- LT breast/IMN, PWT 5000cGy 35 200cGy 9/14 - 02/28/16 ----------------------------------------------------------------------------------------------------- LT SCV/AX, RAO.LPO 5000cGy 35 200cGy 9/14 - 02/28/16 ----------------------------------------------------------------------------------------------------- LT TB boost, bouquet  1200cGy 8 200cGy 10/19 - 03/07/16  ----------------------------------------------------------------------------------------------------- Totals 6200cGy   (4) started tamoxifen 03/25/2016--plan is for 10 years on antiestrogens   PLAN: Jesusita did not show for her 02/29/2020 appointment.  A follow-up letter has been sent.  Nechuma Boven, Valentino Hue, MD  02/29/20 11:56 AM Medical Oncology and Hematology [facility] [address] [place], [place] [address] Tel. [phone]    Fax. [phone]   I, Mickie Bail, am acting as scribe for Dr. Valentino Hue. Loda Bialas.  I, Ruthann Cancer MD, have reviewed the above documentation for accuracy and completeness, and I agree with the above.   *Total Encounter Time as defined by the Centers for Medicare and Medicaid Services includes, in addition to the face-to-face time of a patient visit (documented in the note above) non-face-to-face time: obtaining and reviewing outside history, ordering and reviewing medications, tests or procedures, care coordination (communications with other health care professionals or caregivers)  and documentation in the medical record.

## 2020-02-29 ENCOUNTER — Inpatient Hospital Stay: Payer: BC Managed Care – PPO | Attending: Oncology | Admitting: Oncology

## 2020-02-29 ENCOUNTER — Encounter: Payer: Self-pay | Admitting: Oncology

## 2020-02-29 ENCOUNTER — Inpatient Hospital Stay: Payer: BC Managed Care – PPO

## 2020-02-29 DIAGNOSIS — C50412 Malignant neoplasm of upper-outer quadrant of left female breast: Secondary | ICD-10-CM

## 2020-02-29 DIAGNOSIS — Z170 Estrogen receptor positive status [ER+]: Secondary | ICD-10-CM

## 2020-03-31 ENCOUNTER — Inpatient Hospital Stay: Payer: BC Managed Care – PPO | Attending: Oncology

## 2020-03-31 ENCOUNTER — Other Ambulatory Visit: Payer: Self-pay

## 2020-03-31 ENCOUNTER — Inpatient Hospital Stay (HOSPITAL_BASED_OUTPATIENT_CLINIC_OR_DEPARTMENT_OTHER): Payer: BC Managed Care – PPO | Admitting: Oncology

## 2020-03-31 VITALS — BP 140/77 | HR 70 | Temp 98.8°F | Resp 18 | Ht 66.0 in | Wt 233.2 lb

## 2020-03-31 DIAGNOSIS — Z79810 Long term (current) use of selective estrogen receptor modulators (SERMs): Secondary | ICD-10-CM | POA: Insufficient documentation

## 2020-03-31 DIAGNOSIS — Z9221 Personal history of antineoplastic chemotherapy: Secondary | ICD-10-CM | POA: Diagnosis not present

## 2020-03-31 DIAGNOSIS — Z923 Personal history of irradiation: Secondary | ICD-10-CM | POA: Diagnosis not present

## 2020-03-31 DIAGNOSIS — C50412 Malignant neoplasm of upper-outer quadrant of left female breast: Secondary | ICD-10-CM

## 2020-03-31 DIAGNOSIS — Z170 Estrogen receptor positive status [ER+]: Secondary | ICD-10-CM

## 2020-03-31 DIAGNOSIS — E669 Obesity, unspecified: Secondary | ICD-10-CM | POA: Insufficient documentation

## 2020-03-31 DIAGNOSIS — D72820 Lymphocytosis (symptomatic): Secondary | ICD-10-CM

## 2020-03-31 DIAGNOSIS — Z79899 Other long term (current) drug therapy: Secondary | ICD-10-CM | POA: Insufficient documentation

## 2020-03-31 DIAGNOSIS — C773 Secondary and unspecified malignant neoplasm of axilla and upper limb lymph nodes: Secondary | ICD-10-CM | POA: Diagnosis not present

## 2020-03-31 DIAGNOSIS — Z800 Family history of malignant neoplasm of digestive organs: Secondary | ICD-10-CM | POA: Diagnosis not present

## 2020-03-31 LAB — COMPREHENSIVE METABOLIC PANEL
ALT: 7 U/L (ref 0–44)
AST: 16 U/L (ref 15–41)
Albumin: 3.4 g/dL — ABNORMAL LOW (ref 3.5–5.0)
Alkaline Phosphatase: 60 U/L (ref 38–126)
Anion gap: 7 (ref 5–15)
BUN: 10 mg/dL (ref 6–20)
CO2: 26 mmol/L (ref 22–32)
Calcium: 8.8 mg/dL — ABNORMAL LOW (ref 8.9–10.3)
Chloride: 110 mmol/L (ref 98–111)
Creatinine, Ser: 0.99 mg/dL (ref 0.44–1.00)
GFR, Estimated: >60 mL/min (ref >60)
Glucose, Bld: 91 mg/dL (ref 70–99)
Potassium: 3.8 mmol/L (ref 3.5–5.1)
Sodium: 143 mmol/L (ref 135–145)
Total Bilirubin: 0.3 mg/dL (ref 0.3–1.2)
Total Protein: 7.3 g/dL (ref 6.5–8.1)

## 2020-03-31 LAB — CBC WITH DIFFERENTIAL/PLATELET
Abs Immature Granulocytes: 0.02 10*3/uL (ref 0.00–0.07)
Basophils Absolute: 0 10*3/uL (ref 0.0–0.1)
Basophils Relative: 0 %
Eosinophils Absolute: 0.1 10*3/uL (ref 0.0–0.5)
Eosinophils Relative: 1 %
HCT: 39 % (ref 36.0–46.0)
Hemoglobin: 12.4 g/dL (ref 12.0–15.0)
Immature Granulocytes: 0 %
Lymphocytes Relative: 55 %
Lymphs Abs: 4.8 10*3/uL — ABNORMAL HIGH (ref 0.7–4.0)
MCH: 26.4 pg (ref 26.0–34.0)
MCHC: 31.8 g/dL (ref 30.0–36.0)
MCV: 83 fL (ref 80.0–100.0)
Monocytes Absolute: 0.4 10*3/uL (ref 0.1–1.0)
Monocytes Relative: 5 %
Neutro Abs: 3.4 10*3/uL (ref 1.7–7.7)
Neutrophils Relative %: 39 %
Platelets: 248 10*3/uL (ref 150–400)
RBC: 4.7 MIL/uL (ref 3.87–5.11)
RDW: 14.4 % (ref 11.5–15.5)
WBC: 8.8 10*3/uL (ref 4.0–10.5)
nRBC: 0 % (ref 0.0–0.2)

## 2020-03-31 MED ORDER — TAMOXIFEN CITRATE 20 MG PO TABS: 20.0000 mg | ORAL_TABLET | Freq: Every day | ORAL | 4 refills | Status: DC

## 2020-03-31 NOTE — Progress Notes (Signed)
[facility]  [phone] Fax:([phone]) [phone]   ID: [name] DOB: 06-27-1965  MR#: [id number]  RAX#:[id number]  Patient Care Team: [facility] as PCP - General (Family Medicine) Alphonsa Overall, MD as Consulting Physician (General Surgery) , Virgie Dad, MD as Consulting Physician (Oncology) Gery Pray, MD as Consulting Physician (Radiation Oncology) Olga Millers, MD as Consulting Physician (Obstetrics and Gynecology) Basilio Cairo, MD as Referring Physician (Radiology) Delice Bison, Charlestine Massed, NP as Nurse Practitioner (Hematology and Oncology) OTHER MD:   CHIEF COMPLAINT: Estrogen receptor positive breast cancer  CURRENT TREATMENT:  Tamoxifen   INTERVAL HISTORY: Stormie returns today for follow-up of her estrogen receptor positive breast cancer.  She continues on tamoxifen.    Hot flashes are not a problem.  She has minimal vaginal wetness.  Since her last visit, she underwent bilateral diagnostic mammography with tomography at [facility] on 01/07/2020 showing: breast density category B; no evidence of malignancy in either breast.   REVIEW OF SYSTEMS: Jaelene is working full-time.  She exercises by doing line dancing once a week.  She is planning to walk a little more she says.  She occasionally has funny feeling in her left axilla, would like an itchy or slight discomfort.  This comes and goes.  A detailed review of systems today was otherwise noncontributory   COVID 19 VACCINATION STATUS: Status pos tModerna x2, most recently in February 54   BREAST CANCER HISTORY: From the original intake note:  Anaira had routine screening mammography at [facility] 06/23/2015. There was an area of distortion in the left breast. The patient was then referred to the [facility] where on 07/03/2015 he underwent left diagnostic mammography with tomosynthesis and left breast  ultrasonography. The breast density was category B. In the far outer left breast there was an area of distortion measuring approximately 4 cm. There appeared to be a prominent left axillary lymph node. On physical exam at the 2:00 position of the left breast 12 cm from the nipple there was an area of approximately 4 cm of firmness. There was no palpable mass in the left axilla. Ultrasonography confirmed an irregular hypoechoic mass in the upper-outer quadrant of the left breast measuring 2.6 cm. A little closer to the axilla there was a second irregular hypoechoic mass measuring 0.5 cm. The distance between these 2 masses was 3.6 cm. In addition there was a right axillary lymph node with a thickened cortex. It measured 0.9 cm.  On 5 or 20 11/30/2015 the patient underwent biopsy of the 2 areas in the breast as well as the axillary lymph node. The larger of the 2 masses was an invasive lobular carcinoma, E-cadherin negative, estrogen receptor 100% positive, progesterone receptor 100% positive, both with strong staining intensity, with an MIB-1 of 20%. This second, smaller mass, was also an invasive lobular carcinoma, E-cadherin negative, estrogen receptor 90% positive, progesterone receptor 100% positive, both with strong staining intensity, with an MIB-1 of 5%. The right axillary lymph node was also an invasive mammary carcinoma. And also estrogen receptor positive at 70%, progesterone receptor positive at 90%, with an MIB-1 of 10%.  Her subsequent history is as detailed below   PAST MEDICAL HISTORY: Past Medical History:  Diagnosis Date  . Breast cancer of upper-outer quadrant of left female breast ([facility]) 07/13/2015  . Heart murmur    "nothing to worry about"  . Hypertension   . Personal history of chemotherapy 2017  . Personal  history of radiation therapy 2017    PAST SURGICAL HISTORY: Past Surgical History:  Procedure Laterality Date  . BREAST BIOPSY Left 07/10/2015  . BREAST LUMPECTOMY Left  12/01/2015   LEFT BREAST SEED GUIDED LUMPECTOMY WITH LEFT AXILLARY NODE DISSECTION (Left)  . CESAREAN SECTION  1998  . COLONOSCOPY W/ POLYPECTOMY    . DILATION AND EVACUATION  05/01/2011   Procedure: DILATATION AND EVACUATION;  Surgeon: Levi Aland;  Location: [facility];  Service: Gynecology;  Laterality: N/A;  . FOOT SURGERY Bilateral 1993   bunionectomy  . PORT-A-CATH REMOVAL  12/01/2015  . PORT-A-CATH REMOVAL Right 12/01/2015   Procedure: REMOVAL PORT-A-CATH;  Surgeon: [name], MD;  Location: [facility] OR;  Service: General;  Laterality: Right;  . PORTACATH PLACEMENT Right 07/31/2015   Procedure: INSERTION PORT-A-CATH;  Surgeon: [name], MD;  Location: [facility];  Service: General;  Laterality: Right;  . RADIOACTIVE SEED GUIDED PARTIAL MASTECTOMY/AXILLARY SENTINEL NODE BIOPSY/AXILLARY NODE DISSECTION Left 12/01/2015   Procedure: LEFT BREAST SEED GUIDED LUMPECTOMY WITH LEFT AXILLARY NODE DISSECTION;  Surgeon: [name], MD;  Location: [facility] OR;  Service: General;  Laterality: Left;    FAMILY HISTORY Family History  Problem Relation Age of Onset  . Colon cancer Mother   The patient's father died at age 45 from "complicated causes". The patient's mother died from colon cancer at the age of 78. He cancer was diagnosed the year prior. The patient has 3 brothers, 11 sisters. There is no history of breast or ovarian cancer and no other colon cancers in the family   GYNECOLOGIC HISTORY:  Patient's last menstrual period was 09/08/2015. Menarche age 54, first live birth age 54, which the patient understands increases the risk of breast cancer. She is GX P2. She Was still having regular periods as of March 2017, but periods stopped during chemotherapy. So far there have not resumed. She used birth control remotely with no complications. Current birth control and include condom with spermicide, with a diaphragm to be added.   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 Her husband [name] works for a Midwife is Chiropodist of environmental services. [name] has a child from a prior marriage, Herman Hemric, who is in the [employer]. Malieka as a child from a prior marriage, Shepard [employer], who is going to school in [place]. The couple have a son, [name], 35 years old as of May 2020, at home. She goes to a local [employer].    ADVANCED DIRECTIVES: no   HEALTH MAINTENANCE: Social History   Tobacco Use  . Smoking status: Never Smoker  . Smokeless tobacco: Never Used  Substance Use Topics  . Alcohol use: No    Comment: social  . Drug use: No     Colonoscopy: January 2016/[facility]  PAP: February 2017  Bone density: Never  Lipid panel:  No Known Allergies  Current Outpatient Medications  Medication Sig Dispense Refill  . amLODipine (NORVASC) 10 MG tablet Take 10 mg by mouth daily.  0  . gabapentin (NEURONTIN) 300 MG capsule Take 1 capsule (300 mg total) by mouth at bedtime. 90 capsule 4  . tamoxifen (NOLVADEX) 20 MG tablet TAKE 1 TABLET BY MOUTH EVERY DAY 90 tablet 0  . tamoxifen (NOLVADEX) 20 MG tablet TAKE 1 TABLET BY MOUTH EVERY DAY 90 tablet 0   No current facility-administered medications for this visit.    OBJECTIVE: African-American woman  [facility]  Telephone:([phone]) [phone] Fax:(336) [phone]   ID: [name] DOB: 06-27-1965  MR#: [id number]  RAX#:[id number]  Patient Care Team: [facility] as PCP - General (Family Medicine) Alphonsa Overall, MD as Consulting Physician (General Surgery) , Virgie Dad, MD as Consulting Physician (Oncology) Gery Pray, MD as Consulting Physician (Radiation Oncology) Olga Millers, MD as Consulting Physician (Obstetrics and Gynecology) Basilio Cairo, MD as Referring Physician (Radiology) Delice Bison, Charlestine Massed, NP as Nurse Practitioner (Hematology and Oncology) OTHER MD:   CHIEF COMPLAINT: Estrogen receptor positive breast cancer  CURRENT TREATMENT:  Tamoxifen   INTERVAL HISTORY: Stormie returns today for follow-up of her estrogen receptor positive breast cancer.  She continues on tamoxifen.    Hot flashes are not a problem.  She has minimal vaginal wetness.  Since her last visit, she underwent bilateral diagnostic mammography with tomography at [facility] on 01/07/2020 showing: breast density category B; no evidence of malignancy in either breast.   REVIEW OF SYSTEMS: Jaelene is working full-time.  She exercises by doing line dancing once a week.  She is planning to walk a little more she says.  She occasionally has funny feeling in her left axilla, would like an itchy or slight discomfort.  This comes and goes.  A detailed review of systems today was otherwise noncontributory   COVID 19 VACCINATION STATUS: Status pos tModerna x2, most recently in February 54   BREAST CANCER HISTORY: From the original intake note:  Anaira had routine screening mammography at [facility] 06/23/2015. There was an area of distortion in the left breast. The patient was then referred to the [facility] where on 07/03/2015 he underwent left diagnostic mammography with tomosynthesis and left breast  ultrasonography. The breast density was category B. In the far outer left breast there was an area of distortion measuring approximately 4 cm. There appeared to be a prominent left axillary lymph node. On physical exam at the 2:00 position of the left breast 12 cm from the nipple there was an area of approximately 4 cm of firmness. There was no palpable mass in the left axilla. Ultrasonography confirmed an irregular hypoechoic mass in the upper-outer quadrant of the left breast measuring 2.6 cm. A little closer to the axilla there was a second irregular hypoechoic mass measuring 0.5 cm. The distance between these 2 masses was 3.6 cm. In addition there was a right axillary lymph node with a thickened cortex. It measured 0.9 cm.  On 5 or 20 11/30/2015 the patient underwent biopsy of the 2 areas in the breast as well as the axillary lymph node. The larger of the 2 masses was an invasive lobular carcinoma, E-cadherin negative, estrogen receptor 100% positive, progesterone receptor 100% positive, both with strong staining intensity, with an MIB-1 of 20%. This second, smaller mass, was also an invasive lobular carcinoma, E-cadherin negative, estrogen receptor 90% positive, progesterone receptor 100% positive, both with strong staining intensity, with an MIB-1 of 5%. The right axillary lymph node was also an invasive mammary carcinoma. And also estrogen receptor positive at 70%, progesterone receptor positive at 90%, with an MIB-1 of 10%.  Her subsequent history is as detailed below   PAST MEDICAL HISTORY: Past Medical History:  Diagnosis Date  . Breast cancer of upper-outer quadrant of left female breast ([facility]) 07/13/2015  . Heart murmur    "nothing to worry about"  . Hypertension   . Personal history of chemotherapy 2017  . Personal  168 hours.  Urinalysis No results found for: COLORURINE, APPEARANCEUR, LABSPEC, PHURINE, GLUCOSEU, HGBUR, BILIRUBINUR, KETONESUR, PROTEINUR, UROBILINOGEN, NITRITE, LEUKOCYTESUR   ELIGIBLE FOR AVAILABLE RESEARCH PROTOCOL: no  STUDIES: No results found.    ASSESSMENT: 54 y.o. [facility] woman status post left breast Upper outer quadrant biopsy 07/10/2015 of 2 separate masses as well as left axillary lymph node, all positive for invasive lobular carcinoma, grade 1, estrogen and progesterone receptor positive, HER-2 not amplified, with an MIB-1 between 5 and 20%  (1) neoadjuvan chemotherapy consisting of cyclophosphamide and docetaxel every 3 weeks 4, First dose 08/07/2015, final dose 10/12/2015  (2) status post left lumpectomy and axillary lymph node dissection 12/01/2015 for a residual pT2 pN1, stage IIA invasive lobular carcinoma, repeat prognostic panel again estrogen and progesterone receptor positive, HER-2 not amplified. Margins were close but negative   (3) adjuvant radiation at [facility], completed 03/07/2016 ----------------------------------------------------------------------------------------------------- LT breast/IMN, PWT 5000cGy 35 200cGy 9/14 - 02/28/16 ----------------------------------------------------------------------------------------------------- LT SCV/AX, RAO.LPO 5000cGy 35 200cGy 9/14 - 02/28/16 ----------------------------------------------------------------------------------------------------- LT TB boost, bouquet 1200cGy 8 200cGy 10/19 - 03/07/16   ----------------------------------------------------------------------------------------------------- Totals 6200cGy   (4) started tamoxifen 03/25/2016--plan is for 10 years on antiestrogens   PLAN: Chelcie is a little over 4 years out from definitive surgery for her breast cancer with no evidence of disease recurrence.  This is very favorable.  She is tolerating tamoxifen well and the plan is to continue that for a total of 10 years.  She will have mammography again in August and see me again in 1 year.  I have encouraged her to increase her walking, which I think will improve her health all around.  She will be getting her Moderna booster later this month  She knows to call for any other issue that may develop before the next visit  Total encounter time 25 minutes.*   Torrie Namba, Valentino Hue, MD  03/31/20 1:13 PM Medical Oncology and Hematology [facility] [address] [place], [place] [address] Tel. [phone]    Fax. [phone]   I, Mickie Bail, am acting as scribe for Dr. Valentino Hue. Jerilyn Gillaspie.  I, Ruthann Cancer MD, have reviewed the above documentation for accuracy and completeness, and I agree with the above.   *Total Encounter Time as defined by the Centers for Medicare and Medicaid Services includes, in addition to the face-to-face time of a patient visit (documented in the note above) non-face-to-face time: obtaining and reviewing outside history, ordering and reviewing medications, tests or procedures, care coordination (communications with other health care professionals or caregivers) and documentation in the medical record.

## 2020-04-04 ENCOUNTER — Other Ambulatory Visit: Payer: Self-pay | Admitting: Oncology

## 2020-04-07 ENCOUNTER — Other Ambulatory Visit: Payer: Self-pay | Admitting: *Deleted

## 2020-04-07 MED ORDER — TAMOXIFEN CITRATE 20 MG PO TABS
20.0000 mg | ORAL_TABLET | Freq: Every day | ORAL | 4 refills | Status: DC
Start: 2020-04-07 — End: 2021-04-20

## 2020-04-07 MED ORDER — TAMOXIFEN CITRATE 20 MG PO TABS
20.0000 mg | ORAL_TABLET | Freq: Every day | ORAL | 4 refills | Status: DC
Start: 2020-04-07 — End: 2020-04-07

## 2020-11-23 ENCOUNTER — Other Ambulatory Visit: Payer: Self-pay | Admitting: Oncology

## 2020-11-23 DIAGNOSIS — Z9889 Other specified postprocedural states: Secondary | ICD-10-CM

## 2021-01-25 ENCOUNTER — Ambulatory Visit
Admission: RE | Admit: 2021-01-25 | Discharge: 2021-01-25 | Disposition: A | Discharge status: Home / Self Care | Payer: BC Managed Care – PPO | Source: Ambulatory Visit | Attending: Oncology | Admitting: Oncology

## 2021-01-25 ENCOUNTER — Other Ambulatory Visit: Payer: Self-pay | Admitting: Oncology

## 2021-01-25 ENCOUNTER — Other Ambulatory Visit: Payer: Self-pay

## 2021-01-25 DIAGNOSIS — Z9889 Other specified postprocedural states: Secondary | ICD-10-CM

## 2021-02-27 ENCOUNTER — Other Ambulatory Visit: Payer: Self-pay | Admitting: *Deleted

## 2021-02-27 DIAGNOSIS — Z170 Estrogen receptor positive status [ER+]: Secondary | ICD-10-CM

## 2021-02-27 DIAGNOSIS — C50412 Malignant neoplasm of upper-outer quadrant of left female breast: Secondary | ICD-10-CM

## 2021-02-28 ENCOUNTER — Inpatient Hospital Stay: Payer: BC Managed Care – PPO | Attending: Oncology | Admitting: Oncology

## 2021-02-28 ENCOUNTER — Encounter: Payer: Self-pay | Admitting: Oncology

## 2021-02-28 ENCOUNTER — Inpatient Hospital Stay: Payer: BC Managed Care – PPO

## 2021-02-28 ENCOUNTER — Other Ambulatory Visit: Payer: Self-pay

## 2021-02-28 VITALS — BP 159/91 | HR 72 | Temp 97.7°F | Resp 18 | Ht 66.0 in | Wt 234.1 lb

## 2021-02-28 DIAGNOSIS — Z170 Estrogen receptor positive status [ER+]: Secondary | ICD-10-CM | POA: Diagnosis not present

## 2021-02-28 DIAGNOSIS — I10 Essential (primary) hypertension: Secondary | ICD-10-CM | POA: Insufficient documentation

## 2021-02-28 DIAGNOSIS — Z79899 Other long term (current) drug therapy: Secondary | ICD-10-CM | POA: Diagnosis not present

## 2021-02-28 DIAGNOSIS — Z800 Family history of malignant neoplasm of digestive organs: Secondary | ICD-10-CM | POA: Insufficient documentation

## 2021-02-28 DIAGNOSIS — Z79810 Long term (current) use of selective estrogen receptor modulators (SERMs): Secondary | ICD-10-CM | POA: Insufficient documentation

## 2021-02-28 DIAGNOSIS — C50412 Malignant neoplasm of upper-outer quadrant of left female breast: Secondary | ICD-10-CM | POA: Insufficient documentation

## 2021-02-28 DIAGNOSIS — Z23 Encounter for immunization: Secondary | ICD-10-CM | POA: Diagnosis not present

## 2021-02-28 DIAGNOSIS — Z9221 Personal history of antineoplastic chemotherapy: Secondary | ICD-10-CM | POA: Diagnosis not present

## 2021-02-28 DIAGNOSIS — Z923 Personal history of irradiation: Secondary | ICD-10-CM | POA: Diagnosis not present

## 2021-02-28 LAB — CMP (CANCER CENTER ONLY)
ALT: 12 U/L (ref 0–44)
AST: 32 U/L (ref 15–41)
Albumin: 3.8 g/dL (ref 3.5–5.0)
Alkaline Phosphatase: 61 U/L (ref 38–126)
Anion gap: 13 (ref 5–15)
BUN: 14 mg/dL (ref 6–20)
CO2: 23 mmol/L (ref 22–32)
Calcium: 9.3 mg/dL (ref 8.9–10.3)
Chloride: 106 mmol/L (ref 98–111)
Creatinine: 1.06 mg/dL — ABNORMAL HIGH (ref 0.44–1.00)
GFR, Estimated: >60 mL/min (ref >60)
Glucose, Bld: 83 mg/dL (ref 70–99)
Potassium: 3.5 mmol/L (ref 3.5–5.1)
Sodium: 142 mmol/L (ref 135–145)
Total Bilirubin: 0.6 mg/dL (ref 0.3–1.2)
Total Protein: 7.8 g/dL (ref 6.5–8.1)

## 2021-02-28 LAB — CBC WITH DIFFERENTIAL (CANCER CENTER ONLY)
Abs Immature Granulocytes: 0.02 10*3/uL (ref 0.00–0.07)
Basophils Absolute: 0 10*3/uL (ref 0.0–0.1)
Basophils Relative: 0 %
Eosinophils Absolute: 0.1 10*3/uL (ref 0.0–0.5)
Eosinophils Relative: 1 %
HCT: 35.9 % — ABNORMAL LOW (ref 36.0–46.0)
Hemoglobin: 11.8 g/dL — ABNORMAL LOW (ref 12.0–15.0)
Immature Granulocytes: 0 %
Lymphocytes Relative: 46 %
Lymphs Abs: 4.5 10*3/uL — ABNORMAL HIGH (ref 0.7–4.0)
MCH: 26.9 pg (ref 26.0–34.0)
MCHC: 32.9 g/dL (ref 30.0–36.0)
MCV: 81.8 fL (ref 80.0–100.0)
Monocytes Absolute: 0.5 10*3/uL (ref 0.1–1.0)
Monocytes Relative: 5 %
Neutro Abs: 4.7 10*3/uL (ref 1.7–7.7)
Neutrophils Relative %: 48 %
Platelet Count: 250 10*3/uL (ref 150–400)
RBC: 4.39 MIL/uL (ref 3.87–5.11)
RDW: 15.1 % (ref 11.5–15.5)
WBC Count: 9.8 10*3/uL (ref 4.0–10.5)
nRBC: 0 % (ref 0.0–0.2)

## 2021-02-28 MED ORDER — PNEUMOCOCCAL 20-VAL CONJ VACC 0.5 ML IM SUSY
0.5000 mL | PREFILLED_SYRINGE | Freq: Once | INTRAMUSCULAR | Status: AC
  Administered 2021-02-28: 0.5 mL via INTRAMUSCULAR
  Filled 2021-02-28: qty 0.5

## 2021-02-28 MED ORDER — PNEUMOCOCCAL 20-VAL CONJ VACC 0.5 ML IM SUSY
0.5000 mL | PREFILLED_SYRINGE | INTRAMUSCULAR | Status: DC
  Filled 2021-02-28: qty 0.5

## 2021-02-28 NOTE — Progress Notes (Signed)
[facility]  Telephone:([phone]) [phone] [phone]   ID: Emma [name] DOB: November 06, 1965  MR#: [id number]  HQI#:[id number]  Patient Care Team: [facility], [facility] as PCP - General (Family Medicine) [name], MD as Consulting Physician (General Surgery) Ahliya Glatt, Valentino Hue, MD as Consulting Physician (Oncology) Antony Blackbird, MD as Consulting Physician (Radiation Oncology) Levi Aland, MD as Consulting Physician (Obstetrics and Gynecology) Axel Filler, Larna Daughters, NP as Nurse Practitioner (Hematology and Oncology) Kerin Salen, MD as Consulting Physician (Gastroenterology) OTHER MD:   CHIEF COMPLAINT: Estrogen receptor positive breast cancer  CURRENT TREATMENT:  Tamoxifen   INTERVAL HISTORY: Emma [name] returns today for follow-up of her estrogen receptor positive breast cancer.  She continues on tamoxifen. She denies any issues with tolerating the tamoxifen.  She is without hot flashes or arthralgias.  She has mild vaginal discharge that is not causing any issues.    Since her last visit, she underwent bilateral screening mammography [facility] on [facility]2021 showing: breast density category B; no evidence of malignancy in either breast.  She says that she is not as active as she could be with exercise and plans on getting back into exercise.  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     REVIEW OF SYSTEMS: Review of Systems  Constitutional:  Negative for appetite change, chills, fatigue, fever and unexpected weight change.  HENT:   Negative for hearing loss, lump/mass and trouble swallowing.   Eyes:  Negative for eye problems and icterus.  Respiratory:  Negative for chest tightness, cough and shortness of breath.   Cardiovascular:  Negative for chest pain, leg swelling and palpitations.  Gastrointestinal:  Negative for abdominal distention, abdominal pain,  constipation, diarrhea, nausea and vomiting.  Endocrine: Negative for hot flashes.  Genitourinary:  Negative for difficulty urinating.   Musculoskeletal:  Negative for arthralgias.  Skin:  Negative for itching and rash.  Neurological:  Negative for dizziness, extremity weakness, headaches and numbness.  Hematological:  Negative for adenopathy. Does not bruise/bleed easily.  Psychiatric/Behavioral:  Negative for depression. The patient is not nervous/anxious.       COVID 19 VACCINATION STATUS: Status post Moderna x2, most recently in February 2021   BREAST CANCER HISTORY: From the original intake note:  Emma [name] had routine screening mammography at [facility] 06/23/2015. There was an area of distortion in the left breast. [facility]2015 he underwent left diagnostic mammography with tomosynthesis and left breast ultrasonography. The breast density was category B. In the far outer left breast there was an area of distortion measuring approximately 4 cm. There appeared to be a prominent left axillary lymph node. On physical exam at the 2:00 position of the left breast 12 cm from the nipple there was an area of approximately 4 cm of firmness. There was no palpable mass in the left axilla. Ultrasonography confirmed an irregular hypoechoic mass in the upper-outer quadrant of the left breast measuring 2.6 cm. A little closer to the axilla there was a second irregular hypoechoic mass measuring 0.5 cm. The distance between these 2 masses was 3.6 cm. In addition there was a right axillary lymph node with a thickened cortex. It measured 0.9 cm.  On 5 or 20 11/30/2015 the patient underwent biopsy of the 2 areas in the breast as well as the axillary lymph node. The larger of the 2 masses was  an invasive lobular carcinoma, E-cadherin negative, estrogen receptor 100% positive, progesterone receptor 100% positive, both with strong  staining intensity, with an MIB-1 of 20%. This second, smaller mass, was also an invasive lobular carcinoma, E-cadherin negative, estrogen receptor 90% positive, progesterone receptor 100% positive, both with strong staining intensity, with an MIB-1 of 5%. The right axillary lymph node was also an invasive mammary carcinoma. And also estrogen receptor positive at 70%, progesterone receptor positive at 90%, with an MIB-1 of 10%.  Her subsequent history is as detailed below   PAST MEDICAL HISTORY: Past Medical History:  Diagnosis Date   Breast cancer of upper-outer quadrant of left female breast (HCC) 07/13/2015   Heart murmur    "nothing to worry about"   Hypertension    Personal history of chemotherapy 2017   Personal history of radiation therapy 2017    PAST SURGICAL HISTORY: Past Surgical History:  Procedure Laterality Date   BREAST BIOPSY Left 07/10/2015   BREAST LUMPECTOMY Left 12/01/2015   LEFT BREAST SEED GUIDED LUMPECTOMY WITH LEFT AXILLARY NODE DISSECTION  (Left)   CESAREAN SECTION  1998   COLONOSCOPY W/ POLYPECTOMY     DILATION AND EVACUATION  05/01/2011   Procedure: DILATATION AND EVACUATION;  Surgeon: Levi Aland;  Location: [facility];  Service: Gynecology;  Laterality: N/A;   FOOT SURGERY Bilateral 1993   bunionectomy   PORT-A-CATH REMOVAL  12/01/2015   PORT-A-CATH REMOVAL Right 12/01/2015   Procedure: REMOVAL PORT-A-CATH;  Surgeon: [name], MD;  Location: [facility] OR;  Service: General;  Laterality: Right;   PORTACATH PLACEMENT Right 07/31/2015   Procedure: INSERTION PORT-A-CATH;  Surgeon: [name], MD;  Location: [facility];  Service: General;  Laterality: Right;   RADIOACTIVE SEED GUIDED PARTIAL MASTECTOMY/AXILLARY SENTINEL NODE BIOPSY/AXILLARY NODE DISSECTION Left 12/01/2015   Procedure: LEFT BREAST SEED GUIDED LUMPECTOMY WITH LEFT AXILLARY NODE DISSECTION;  Surgeon: [name], MD;  Location: [facility] OR;  Service: General;  Laterality: Left;    FAMILY  HISTORY Family History  Problem Relation Age of Onset   Colon cancer Mother   The patient's father died at age 51 from "complicated causes". The patient's mother died from colon cancer at the age of 92. He cancer was diagnosed the year prior. The patient has 3 brothers, 11 sisters. There is no history of breast or ovarian cancer and no other colon cancers in the family   GYNECOLOGIC HISTORY:  Patient's last menstrual period was 09/08/2015. Menarche age 51, first live birth age 22, which the patient understands increases the risk of breast cancer. She is GX P2. She Was still having regular periods as of March 2017, but periods stopped during chemotherapy. So far there have not resumed. She used birth control remotely with no complications. Current birth control and include condom with spermicide, with a diaphragm to be added.   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 Her husband [name] works for a Midwife is Chiropodist of environmental services. [name] has a child from a prior marriage, Bleu Francesconi, who is in the [employer]. Kayliah as a child from a prior marriage, [employer], who is going to school in [place]. The couple have a son, [name], 20 years old as of May 2020, at home. She goes to a local [employer].    ADVANCED DIRECTIVES: no   HEALTH MAINTENANCE: Social History   Tobacco Use   Smoking status: Never  Smokeless tobacco: Never  Substance Use Topics   Alcohol use: No    Comment: social   Drug use: No     Colonoscopy: January 2016/[facility]  PAP: February 2017  Bone density: Never  Lipid panel:  No Known Allergies  Current Outpatient Medications  Medication Sig Dispense Refill   amLODipine (NORVASC) 10 MG tablet Take 10 mg by mouth daily.  0   gabapentin (NEURONTIN) 300 MG capsule Take 1 capsule (300 mg total) by mouth at bedtime. 90  capsule 4   tamoxifen (NOLVADEX) 20 MG tablet Take 1 tablet (20 mg total) by mouth daily. 90 tablet 4   No current facility-administered medications for this visit.    OBJECTIVE: African-American woman in no acute distress  Vitals:   02/28/21 1351  BP: (!) 159/91  Pulse: 72  Resp: 18  Temp: 97.7 F (36.5 C)  SpO2: 100%     Body mass index is 37.78 kg/m.    ECOG FS: Filed Weights   02/28/21 1351  Weight: 234 lb 1.6 oz (106.2 kg)  GENERAL: Patient is a well appearing female in no acute distress HEENT:  Sclerae anicteric.  Oropharynx clear and moist. No ulcerations or evidence of oropharyngeal candidiasis. Neck is supple.  NODES:  No cervical, supraclavicular, or axillary lymphadenopathy palpated.  BREAST EXAM:  Deferred. LUNGS:  Clear to auscultation bilaterally.  No wheezes or rhonchi. HEART:  Regular rate and rhythm. No murmur appreciated. ABDOMEN:  Soft, nontender.  Positive, normoactive bowel sounds. No organomegaly palpated. MSK:  No focal spinal tenderness to palpation. Full range of motion bilaterally in the upper extremities. EXTREMITIES:  No peripheral edema.   SKIN:  Clear with no obvious rashes or skin changes. No nail dyscrasia. NEURO:  Nonfocal. Well oriented.  Appropriate affect.   LAB RESULTS:  CMP     Component Value Date/Time   NA 142 02/28/2021 1335   NA 144 09/24/2016 1414   K 3.5 02/28/2021 1335   K 3.8 09/24/2016 1414   CL 106 02/28/2021 1335   CO2 23 02/28/2021 1335   CO2 26 09/24/2016 1414   GLUCOSE 83 02/28/2021 1335   GLUCOSE 95 09/24/2016 1414   BUN 14 02/28/2021 1335   BUN 15.8 09/24/2016 1414   CREATININE 1.06 (H) 02/28/2021 1335   CREATININE 1.0 09/24/2016 1414   CALCIUM 9.3 02/28/2021 1335   CALCIUM 9.2 09/24/2016 1414   PROT 7.8 02/28/2021 1335   PROT 7.3 09/24/2016 1414   ALBUMIN 3.8 02/28/2021 1335   ALBUMIN 3.5 09/24/2016 1414   AST 32 02/28/2021 1335   AST 18 09/24/2016 1414   ALT 12 02/28/2021 1335   ALT 14 09/24/2016 1414    ALKPHOS 61 02/28/2021 1335   ALKPHOS 62 09/24/2016 1414   BILITOT 0.6 02/28/2021 1335   BILITOT 0.38 09/24/2016 1414   GFRNONAA >60 02/28/2021 1335   GFRAA >60 03/25/2019 1118   GFRAA >60 09/24/2017 1237    INo results found for: SPEP, UPEP  Lab Results  Component Value Date   WBC 9.8 02/28/2021   NEUTROABS 4.7 02/28/2021   HGB 11.8 (L) 02/28/2021   HCT 35.9 (L) 02/28/2021   MCV 81.8 02/28/2021   PLT 250 02/28/2021      Chemistry      Component Value Date/Time   NA 142 02/28/2021 1335   NA 144 09/24/2016 1414   K 3.5 02/28/2021 1335   K 3.8 09/24/2016 1414   CL 106 02/28/2021 1335   CO2 23 02/28/2021 1335   CO2  26 09/24/2016 1414   BUN 14 02/28/2021 1335   BUN 15.8 09/24/2016 1414   CREATININE 1.06 (H) 02/28/2021 1335   CREATININE 1.0 09/24/2016 1414      Component Value Date/Time   CALCIUM 9.3 02/28/2021 1335   CALCIUM 9.2 09/24/2016 1414   ALKPHOS 61 02/28/2021 1335   ALKPHOS 62 09/24/2016 1414   AST 32 02/28/2021 1335   AST 18 09/24/2016 1414   ALT 12 02/28/2021 1335   ALT 14 09/24/2016 1414   BILITOT 0.6 02/28/2021 1335   BILITOT 0.38 09/24/2016 1414      No results found for: LABCA2  No components found for: LABCA125  No results for input(s): INR in the last 168 hours.  Urinalysis No results found for: COLORURINE, APPEARANCEUR, LABSPEC, PHURINE, GLUCOSEU, HGBUR, BILIRUBINUR, KETONESUR, PROTEINUR, UROBILINOGEN, NITRITE, LEUKOCYTESUR   ELIGIBLE FOR AVAILABLE RESEARCH PROTOCOL: no  STUDIES: No results found.    ASSESSMENT: 55 y.o. Emma [name] woman status post left breast Upper outer quadrant biopsy 07/10/2015 of 2 separate masses as well as left axillary lymph node, all positive for invasive lobular carcinoma, grade 1, estrogen and progesterone receptor positive, HER-2 not amplified, with an MIB-1 between 5 and 20%  (1) neoadjuvan chemotherapy consisting of cyclophosphamide and docetaxel every 3 weeks 4, First dose 08/07/2015,  final dose 10/12/2015  (2) status post left lumpectomy and axillary lymph node dissection 12/01/2015 for a residual pT2 pN1, stage IIA invasive lobular carcinoma, repeat prognostic panel again estrogen and progesterone receptor positive, HER-2 not amplified. Margins were close but negative   (3) adjuvant radiation at [facility], completed 03/07/2016 ----------------------------------------------------------------------------------------------------- LT breast/IMN, PWT 5000cGy 35 200cGy 9/14 - 02/28/16 ----------------------------------------------------------------------------------------------------- LT SCV/AX, RAO.LPO 5000cGy 35 200cGy 9/14 - 02/28/16 ----------------------------------------------------------------------------------------------------- LT TB boost, bouquet 1200cGy 8 200cGy 10/19 - 03/07/16  ----------------------------------------------------------------------------------------------------- Totals 6200cGy   (4) started tamoxifen 03/25/2016--plan is for 10 years on antiestrogens   PLAN: Emma [name] is a little over 5 years out from definitive surgery for her breast cancer with no evidence of disease recurrence.  This is very favorable.  She continues on tamoxifen which she tolerates well.  The plan is for total of 10 years on that drug.  She knows to call for any other issue that may develop before the next visit  Total encounter time 25 minutes.Lillard Anes, NP 02/28/21 4:04 PM Medical Oncology and Hematology [facility] [address] [place], [place] [address] Tel. [phone]    Fax. [phone]   ADDENDUM: Emma [name] continues to do well on tamoxifen.  We discussed exercise issues.  She does bike (outdoors) but I think given the vagaries of the weather she might try to get a stationary bike so she can always do some exercise.  She would also greatly benefit from water exercises and she tells me there is a pool nearby that she can join.  We will follow-up  on this at the next visit.  Otherwise I am delighted at how well she is doing.  She knows to call for any other issue that may develop before her return here.  I personally saw this patient and performed a substantive portion of this encounter with the listed APP documented above.   Lowella Dell, MD Medical Oncology and Hematology [facility] [address] [place], [place] [address] Tel. [phone]    Fax. [phone]    *Total Encounter Time as defined by the Centers for Medicare and Medicaid Services includes, in addition to the face-to-face time of a  patient visit (documented in the note above) non-face-to-face time: obtaining and reviewing outside history, ordering and reviewing medications, tests or procedures, care coordination (communications with other health care professionals or caregivers) and documentation in the medical record.

## 2021-04-09 ENCOUNTER — Other Ambulatory Visit: Payer: Self-pay | Admitting: Obstetrics and Gynecology

## 2021-04-09 DIAGNOSIS — N950 Postmenopausal bleeding: Secondary | ICD-10-CM

## 2021-04-11 ENCOUNTER — Ambulatory Visit
Admission: RE | Admit: 2021-04-11 | Discharge: 2021-04-11 | Disposition: A | Discharge status: Home / Self Care | Payer: BC Managed Care – PPO | Source: Ambulatory Visit | Attending: Obstetrics and Gynecology | Admitting: Obstetrics and Gynecology

## 2021-04-11 DIAGNOSIS — N950 Postmenopausal bleeding: Secondary | ICD-10-CM

## 2021-04-12 ENCOUNTER — Other Ambulatory Visit: Payer: Self-pay | Admitting: Oncology

## 2021-04-12 NOTE — Progress Notes (Signed)
I called Emma [name] and reviewed results of her pelvic ultrasound.  She is working with Dr.Marinone, who ordered the scan for evaluation of bloating and postmenopausal bleeding.  [name] is aware that she is going to need surgery.  She tells me Dr. Brien Mates has requested an appointment with a surgeon but she does not know the name.  Doubtless this is Dr. Berline Lopes here in town, since she is our only gynecology oncologist.  I left a note also today on Dr. Charisse March desk to see if we can expedite the consult.  [name] is aware that tamoxifen can cause cancer of the uterus and I have asked her to stop that medication at this point.

## 2021-04-13 ENCOUNTER — Inpatient Hospital Stay: Payer: BC Managed Care – PPO | Attending: Oncology

## 2021-04-13 ENCOUNTER — Other Ambulatory Visit: Payer: Self-pay | Admitting: *Deleted

## 2021-04-13 ENCOUNTER — Encounter: Payer: Self-pay | Admitting: Gynecologic Oncology

## 2021-04-13 ENCOUNTER — Other Ambulatory Visit: Payer: Self-pay

## 2021-04-13 DIAGNOSIS — R971 Elevated cancer antigen 125 [CA 125]: Secondary | ICD-10-CM | POA: Insufficient documentation

## 2021-04-13 DIAGNOSIS — C50412 Malignant neoplasm of upper-outer quadrant of left female breast: Secondary | ICD-10-CM

## 2021-04-13 DIAGNOSIS — N898 Other specified noninflammatory disorders of vagina: Secondary | ICD-10-CM | POA: Insufficient documentation

## 2021-04-13 DIAGNOSIS — R011 Cardiac murmur, unspecified: Secondary | ICD-10-CM | POA: Insufficient documentation

## 2021-04-13 DIAGNOSIS — Z79899 Other long term (current) drug therapy: Secondary | ICD-10-CM | POA: Insufficient documentation

## 2021-04-13 DIAGNOSIS — Z79810 Long term (current) use of selective estrogen receptor modulators (SERMs): Secondary | ICD-10-CM | POA: Diagnosis not present

## 2021-04-13 DIAGNOSIS — Z170 Estrogen receptor positive status [ER+]: Secondary | ICD-10-CM | POA: Diagnosis not present

## 2021-04-13 DIAGNOSIS — I10 Essential (primary) hypertension: Secondary | ICD-10-CM | POA: Diagnosis not present

## 2021-04-13 DIAGNOSIS — R1900 Intra-abdominal and pelvic swelling, mass and lump, unspecified site: Secondary | ICD-10-CM | POA: Insufficient documentation

## 2021-04-13 DIAGNOSIS — C539 Malignant neoplasm of cervix uteri, unspecified: Secondary | ICD-10-CM | POA: Insufficient documentation

## 2021-04-13 DIAGNOSIS — D390 Neoplasm of uncertain behavior of uterus: Secondary | ICD-10-CM | POA: Diagnosis present

## 2021-04-13 DIAGNOSIS — R140 Abdominal distension (gaseous): Secondary | ICD-10-CM

## 2021-04-13 LAB — CEA (IN HOUSE-CHCC): CEA (CHCC-In House): <1 ng/mL (ref 0.00–5.00)

## 2021-04-13 NOTE — Progress Notes (Signed)
T

## 2021-04-14 LAB — CA 125: Cancer Antigen (CA) 125: 238 U/mL — ABNORMAL HIGH (ref 0.0–38.1)

## 2021-04-14 LAB — CANCER ANTIGEN 27.29: CA 27.29: 5.1 U/mL (ref 0.0–38.6)

## 2021-04-14 NOTE — Progress Notes (Signed)
GYNECOLOGIC ONCOLOGY NEW PATIENT CONSULTATION   Patient Name: Emma [name]  Patient Age: 55 y.o. Date of Service: 04/16/2021 Referring Provider: Lurline Del, MD  Primary Care Provider: [facility] Provider: Jeral Pinch, MD   Assessment/Plan:  Postmenopausal patient with recent history of breast cancer on Tamoxifen now with evidence of metastatic disease, suspected to be endometrial.    I discussed with the patient my exam findings from today. Unfortunately, her entire cervix and anterior vagina seem to be replaced by tumor. I performed a biopsy, which will be rushed, and hope to have some results by tomorrow. My suspicion is that this may be metastatic uterine cancer with involvement of the cervix and anterior vagina. I'm somewhat surprised that her Pap smear came back negative, but we discussed it this is not a diagnostic tool and useful as a screening tool.   I have ordered a CT scan to help better elucidate her intraabdominal mass. If this is an endometrial cancer, I suspect that the cystic massing on ultrasound may be metastatic disease to her adnexa versus hematometra.   Pelvic ultrasound is concerning for abdominal ascites as well as possible peritoneal disease. She has evidence of parametrial invasion on her rectovaginal exam today. While her CA-125 was elevated, we discussed that this is not a specific test, and can be elevated in certain types of high risk uterine cancer.  I suspect that her pulmonary symptoms are related to her intra-abdominal and pelvic mass. Given disease burden already evident on my exam today, CT of her chest was added to evaluate for pleural effusions as well as pulmonary disease.  Patient is not a candidate for definitive surgery based on my exam findings today, regardless of the diagnosis.  A copy of this note was sent to the patient's referring provider.   85 minutes of total time was spent for this  patient encounter, including preparation, face-to-face counseling with the patient and coordination of care, and documentation of the encounter.   Jeral Pinch, MD  Division of Gynecologic Oncology  Department of Obstetrics and Gynecology  [facility]  ___________________________________________  Chief Complaint: No chief complaint on file.   History of Present Illness:  Emma [name] is a 55 y.o. y.o. female who is seen in consultation at the request of Dr. Jana Hakim for an evaluation of an adnexal mass.  Patient reports at least 1 or 2 months of multiple symptoms including fatigue, not sleeping well secondary to abdominal discomfort, and increasing abdominal girth.  The last several months, she would be very tired in the afternoon and need to take a nap.  She has had 1 episode of a gush of bleeding when she was laying down, otherwise denies any vaginal bleeding.  Since that time, she endorses vaginal discharge that is malodorous.  Given bleeding and discharge, she called to establish with a new gynecologist since her prior gynecologist had retired.  She underwent EMB on 11/23 with pathology revealing inflammatory debris and degenerated cells, no viable tissue identified.  She also underwent pelvic ultrasound on 11/30 showing a uterus measuring 8.6 x 3.7 x 5.5 cm with a large heterogeneous mass measuring 18 x 16 x 19 cm in the region of the uterine fundus but extends to the right adnexa and to the left of midline.  Endometrium poorly visualized.  Moderate ascites also noted in the pelvis with suspicion of soft tissue nodules within the ascites fluid.  Pap smear was done at her  visit on 11/23 and negative for intraepithelial lesion or malignancy.  Tumor markers have been obtained since and are as follows CEA: <1 CA-125: 238 CA 27.29: 5.1  In addition to symptoms described above, the patient notes early satiety and decreased appetite.  She has had some nausea,  denies any emesis.  She is noted constipation and is using a stool softener.  Reports urinating normally.  For the last month or 2 she is also felt short of breath and feels like she is breathing more heavily.  She frequently has to stop while moving around to catch her breath.  She has had about 15 pounds of unintentional weight loss in less than 2 months.  Treatment History: Oncology History  Malignant neoplasm of upper-outer quadrant of left breast in female, estrogen receptor positive ([place])  07/13/2015 Initial Diagnosis   Malignant neoplasm of upper-outer quadrant of left breast in female, estrogen receptor positive ([place])   08/07/2015 - 10/12/2015 Neo-Adjuvant Chemotherapy   Docetaxel, cyclophosphamide every 3 weeks times 4.     12/01/2015 Surgery   Left lumpectomy Lucia Gaskins): ILC, grade II, 4.7cm, margins negative, 2/13 lymph nodes positive, ypT2, ypN1a, ER+(70%), PR+(95%), HER-2 negative (ratio 1.11).     01/25/2016 - 03/09/2016 Radiation Therapy   At [facility]:  ----------------------------------------------------------------------------------------------------- LT breast/IMN, PWT 5000cGy 35 200cGy 9/14 - 02/28/16 ----------------------------------------------------------------------------------------------------- LT SCV/AX, RAO.LPO 5000cGy 35 200cGy 9/14 - 02/28/16 ----------------------------------------------------------------------------------------------------- LT TB boost, bouquet 1200cGy 8 200cGy 10/19 - 03/07/16  ----------------------------------------------------------------------------------------------------- Totals 6200cGy    03/2016 -  Anti-estrogen oral therapy   Tamoxifen daily   04/2016 Miscellaneous   [facility] consent, signed not enrolled    PAST MEDICAL HISTORY:  Past Medical History:  Diagnosis Date   Breast cancer of upper-outer quadrant of left female breast ([place]) 07/13/2015   Heart murmur    "nothing to worry about"   Hypertension    Personal history of chemotherapy  2017   Personal history of radiation therapy 2017     PAST SURGICAL HISTORY:  Past Surgical History:  Procedure Laterality Date   BREAST BIOPSY Left 07/10/2015   BREAST LUMPECTOMY Left 12/01/2015   LEFT BREAST SEED GUIDED LUMPECTOMY WITH LEFT AXILLARY NODE DISSECTION  (Left)   CESAREAN SECTION  1998   COLONOSCOPY W/ POLYPECTOMY     DILATION AND EVACUATION  05/01/2011   Procedure: DILATATION AND EVACUATION;  Surgeon: Olga Millers;  Location: [facility];  Service: Gynecology;  Laterality: N/A;   FOOT SURGERY Bilateral 1993   bunionectomy   PORT-A-CATH REMOVAL  12/01/2015   PORT-A-CATH REMOVAL Right 12/01/2015   Procedure: REMOVAL PORT-A-CATH;  Surgeon: Alphonsa Overall, MD;  Location: [facility];  Service: General;  Laterality: Right;   PORTACATH PLACEMENT Right 07/31/2015   Procedure: INSERTION PORT-A-CATH;  Surgeon: Alphonsa Overall, MD;  Location: [place];  Service: General;  Laterality: Right;   RADIOACTIVE SEED GUIDED PARTIAL MASTECTOMY/AXILLARY SENTINEL NODE BIOPSY/AXILLARY NODE DISSECTION Left 12/01/2015   Procedure: LEFT BREAST SEED GUIDED LUMPECTOMY WITH LEFT AXILLARY NODE DISSECTION;  Surgeon: Alphonsa Overall, MD;  Location: [place];  Service: General;  Laterality: Left;    OB/GYN HISTORY:  OB History  Gravida Para Term Preterm AB Living  2 2          SAB IAB Ectopic Multiple Live Births               # Outcome Date GA Lbr Len/2nd Weight Sex Delivery Anes PTL Lv  2 Para           1  Para             Patient's last menstrual period was 09/08/2015.  Age at menarche: 79 Age at menopause: 10 Hx of HRT: Denies, but has been on tamoxifen which was stopped on 12/1 Hx of STDs: Denies Last pap: See above History of abnormal pap smears: Denies  SCREENING STUDIES:  Last mammogram: 2022  Last colonoscopy: 2021  MEDICATIONS: Outpatient Encounter Medications as of 04/16/2021  Medication Sig   amLODipine (NORVASC) 10 MG tablet Take 10 mg by mouth daily.   gabapentin  (NEURONTIN) 300 MG capsule Take 1 capsule (300 mg total) by mouth at bedtime.   cyclobenzaprine (FLEXERIL) 5 MG tablet SMARTSIG:1 Tablet(s) By Mouth 1-3 Times Daily PRN (Patient not taking: Reported on 04/13/2021)   meloxicam (MOBIC) 15 MG tablet Take 15 mg by mouth daily. (Patient not taking: Reported on 04/13/2021)   tamoxifen (NOLVADEX) 20 MG tablet Take 1 tablet (20 mg total) by mouth daily. (Patient not taking: Reported on 04/13/2021)   No facility-administered encounter medications on file as of 04/16/2021.    ALLERGIES:  No Known Allergies   FAMILY HISTORY:  Family History  Problem Relation Age of Onset   Colon cancer Mother    Prostate cancer Brother    Ovarian cancer Other    Breast cancer Other    Prostate cancer Other    Endometrial cancer Other    Pancreatic cancer Other      SOCIAL HISTORY:  Social Connections: Not on file    REVIEW OF SYSTEMS:  Pertinent positives include appetite changes, unintentional weight loss, cough, voice changes, shortness of breath, constipation, back pain, vaginal bleeding, dry lips. Denies fevers, chills Denies hearing loss, neck lumps or masses, mouth sores, ringing in ears or voice changes. Denies cough. Denies chest pain or palpitations. Denies leg swelling. Denies blood in stools, diarrhea, vomiting. Denies pain with intercourse, dysuria, frequency, hematuria or incontinence. Denies hot flashes.   Denies joint pain or muscle pain/cramps. Denies itching, rash, or wounds. Denies dizziness, headaches, numbness or seizures. Denies swollen lymph nodes or glands, denies easy bruising or bleeding. Denies anxiety, depression, confusion, or decreased concentration.  Physical Exam:  Vital Signs for this encounter:  Blood pressure 138/72, pulse (!) 117, temperature 98.8 F (37.1 C), temperature source Tympanic, resp. rate 18, height '5\' 6"'  (1.676 m), weight 219 lb 3.2 oz (99.4 kg), last menstrual period 09/08/2015, SpO2 98 %. Body mass index  is 35.38 kg/m. General: Alert, oriented, no acute distress.  HEENT: Normocephalic, atraumatic. Sclera anicteric.  Chest: Mildly decreased breath sounds at lung bases, otherwise lungs are clear to auscultation bilaterally. No wheezes, rhonchi, or rales. Cardiovascular: Tachycardic with heart rate in the 110s, regular rhythm, no murmurs, rubs, or gallops.  Abdomen: Obese. Normoactive bowel sounds.  Mildly distended, firm and nodular mass that extends approximately 10 cm above the umbilicus in the midline that is palpable on abdominal exam.  Due to this mass and abdominal distention, difficult to appreciate fluid wave. Extremities: Grossly normal range of motion. Warm, well perfused. No edema bilaterally.  Skin: No rashes or lesions.  Lymphatics: No cervical, supraclavicular, or inguinal adenopathy.  GU: Normal external female genitalia.  On speculum exam, the cervix is very difficult to see but the anterior vagina and cervix are markedly abnormal and appear very friable and vascular.  There is almost a blue/purple hue to the tissue itself.  These changes extend along most of the anterior vagina.  On bimanual exam, the cervix is markedly enlarged  measuring approximately 8-10 cm, nodular and feels vesicular.  These changes extend down almost the entire length of the anterior vagina.  On rectovaginal exam, the cervix feels to be at least 8 cm with parametrial involvement appreciated.  Cervical biopsy procedure Preoperative diagnosis: Large pelvic mass, abnormal cervix and anterior vagina Postoperative diagnosis: Same as above Procedure: Cervical biopsy Physician: Berline Lopes MD Estimated blood loss: 25 cc Specimens: Cervical biopsy Procedure: After the procedure was discussed with the patient given physical exam findings, patient gave verbal consent.  She was already in dorsolithotomy position with a speculum in the vagina.  The cervix was cleansed with Betadine x3 and Tischler forceps were used to take a  biopsy of what appeared to be the cervix.  This was placed in formalin to be sent to pathology.  Silver nitrate and pressure were used to achieve hemostasis.  After all instruments removed from the vagina and bimanual exam was performed, there was still some ongoing bleeding noted from the biopsy site.  The vagina was packed with moistened Kerlix for approximately 15 minutes.  This was then removed and the patient had a pad in place and was asked to ambulate for 10 or 15 minutes.  Given minimal bleeding noted, the patient was then deemed appropriate for discharge from clinic.  Overall she tolerated the procedure well.  LABORATORY AND RADIOLOGIC DATA:  Outside medical records were reviewed to synthesize the above history, along with the history and physical obtained during the visit.   Lab Results  Component Value Date   WBC 9.8 02/28/2021   HGB 11.8 (L) 02/28/2021   HCT 35.9 (L) 02/28/2021   PLT 250 02/28/2021   GLUCOSE 83 02/28/2021   ALT 12 02/28/2021   AST 32 02/28/2021   NA 142 02/28/2021   K 3.5 02/28/2021   CL 106 02/28/2021   CREATININE 1.06 (H) 02/28/2021   BUN 14 02/28/2021   CO2 23 02/28/2021   TSH 0.057 (L) 05/01/2011   HGBA1C 5.7 (H) 04/15/2016   Pelvic ultrasound on 11/30: IMPRESSION: 1. Large heterogeneous pelvic mass measuring up to 19 cm concerning for neoplasm though exact origin of the mass (uterine versus adnexal) is indeterminate on this exam. Recommend gynecology consultation and further evaluation with pelvic MRI. 2. Moderate ascites in the pelvis with suspicion of soft tissue nodules within the ascites fluid.

## 2021-04-16 ENCOUNTER — Other Ambulatory Visit: Payer: Self-pay

## 2021-04-16 ENCOUNTER — Inpatient Hospital Stay (HOSPITAL_BASED_OUTPATIENT_CLINIC_OR_DEPARTMENT_OTHER): Payer: BC Managed Care – PPO | Admitting: Gynecologic Oncology

## 2021-04-16 ENCOUNTER — Other Ambulatory Visit: Payer: Self-pay | Admitting: Oncology

## 2021-04-16 ENCOUNTER — Encounter: Payer: Self-pay | Admitting: Gynecologic Oncology

## 2021-04-16 VITALS — BP 138/72 | HR 117 | Temp 98.8°F | Resp 18 | Ht 66.0 in | Wt 219.2 lb

## 2021-04-16 DIAGNOSIS — N888 Other specified noninflammatory disorders of cervix uteri: Secondary | ICD-10-CM

## 2021-04-16 DIAGNOSIS — R1900 Intra-abdominal and pelvic swelling, mass and lump, unspecified site: Secondary | ICD-10-CM | POA: Diagnosis not present

## 2021-04-16 DIAGNOSIS — C50412 Malignant neoplasm of upper-outer quadrant of left female breast: Secondary | ICD-10-CM

## 2021-04-16 DIAGNOSIS — N9489 Other specified conditions associated with female genital organs and menstrual cycle: Secondary | ICD-10-CM

## 2021-04-16 DIAGNOSIS — C800 Disseminated malignant neoplasm, unspecified: Secondary | ICD-10-CM

## 2021-04-16 NOTE — Patient Instructions (Addendum)
It was a pleasure meeting you today.  I will call you with biopsy results when back, hopefully tomorrow. I will also call you when I get CT results.  Based on what I am seeing and feeling today on your exam, I suspect that this is either an advanced uterine cancer or advanced cervical cancer. We will most likely be discussing treatment options other than surgery, but I will need to see final results from biopsy today and CT scan.  Please call the clinic with any questions at [phone].

## 2021-04-19 ENCOUNTER — Ambulatory Visit (HOSPITAL_BASED_OUTPATIENT_CLINIC_OR_DEPARTMENT_OTHER)
Admission: RE | Admit: 2021-04-19 | Discharge: 2021-04-19 | Disposition: A | Discharge status: Home / Self Care | Payer: BC Managed Care – PPO | Source: Ambulatory Visit | Attending: Oncology | Admitting: Oncology

## 2021-04-19 ENCOUNTER — Encounter (HOSPITAL_BASED_OUTPATIENT_CLINIC_OR_DEPARTMENT_OTHER): Payer: Self-pay

## 2021-04-19 ENCOUNTER — Other Ambulatory Visit: Payer: Self-pay

## 2021-04-19 DIAGNOSIS — C800 Disseminated malignant neoplasm, unspecified: Secondary | ICD-10-CM | POA: Diagnosis present

## 2021-04-19 DIAGNOSIS — N9489 Other specified conditions associated with female genital organs and menstrual cycle: Secondary | ICD-10-CM | POA: Diagnosis present

## 2021-04-19 DIAGNOSIS — N888 Other specified noninflammatory disorders of cervix uteri: Secondary | ICD-10-CM | POA: Diagnosis present

## 2021-04-19 LAB — SURGICAL PATHOLOGY

## 2021-04-19 LAB — POCT I-STAT CREATININE: Creatinine, Ser: 1.4 mg/dL — ABNORMAL HIGH (ref 0.44–1.00)

## 2021-04-19 MED ORDER — IOHEXOL 300 MG/ML  SOLN
80.0000 mL | Freq: Once | INTRAMUSCULAR | Status: AC | PRN
  Administered 2021-04-19: 80 mL via INTRAVENOUS

## 2021-04-20 ENCOUNTER — Other Ambulatory Visit: Payer: Self-pay | Admitting: Hematology and Oncology

## 2021-04-20 ENCOUNTER — Telehealth: Payer: Self-pay | Admitting: Gynecologic Oncology

## 2021-04-20 ENCOUNTER — Telehealth: Payer: Self-pay | Admitting: *Deleted

## 2021-04-20 DIAGNOSIS — C539 Malignant neoplasm of cervix uteri, unspecified: Secondary | ICD-10-CM | POA: Insufficient documentation

## 2021-04-20 NOTE — Telephone Encounter (Signed)
Patient called and stated "I wanted to know if Dr Berline Lopes has gotten my results and what the next steps are." Per Melissa APP "Dr Berline Lopes is waiting for the CT scan and will call her back with the results and next steps."

## 2021-04-20 NOTE — Telephone Encounter (Signed)
Called the patient and spoke with her about pathology report from biopsy earlier this week.  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  While metastatic breast cancer or ovarian cancer cannot be ruled out, given my exam, I think cervical cancer is more likely.  Also discussed imaging findings with her on CT scan.  Awaiting official radiology review and will release this to her once available.  We will plan to review her case at tumor board.  Discussed that this is a very rare histology.  Initial treatment will likely include chemotherapy, possibly with the addition of radiation.  We will speak with Santiago Glad about ordering port placement, patient aware.  She is scheduled to see Dr. Alvy Bimler on 12/15.  Jeral Pinch MD Gynecologic Oncology

## 2021-04-20 NOTE — Progress Notes (Signed)
START OFF PATHWAY REGIMEN - Other   [id number]:Cisplatin 75 mg/m2 Day 1 + Etoposide 100 mg/m2 Days 1-3 q21 Days:   A cycle is every 21 days:     Etoposide      Cisplatin   **Always confirm dose/schedule in your pharmacy ordering system**  Patient Characteristics: Intent of Therapy: Non-Curative / Palliative Intent, Discussed with Patient 

## 2021-04-23 ENCOUNTER — Other Ambulatory Visit: Payer: Self-pay | Admitting: Hematology and Oncology

## 2021-04-23 ENCOUNTER — Telehealth: Payer: Self-pay | Admitting: Oncology

## 2021-04-23 DIAGNOSIS — C539 Malignant neoplasm of cervix uteri, unspecified: Secondary | ICD-10-CM

## 2021-04-23 NOTE — Telephone Encounter (Signed)
Emma [name] and reviewed port appointment and instructions.  Also advised her to expect a call from the schedulers to start chemotherapy on 05/01/21.  She verbalized understanding and agreement.

## 2021-04-23 NOTE — Telephone Encounter (Signed)
Left a message with port placement appointment and instructions on 04/30/21.  Requested a return call to confirm.

## 2021-04-24 ENCOUNTER — Telehealth: Payer: Self-pay | Admitting: Hematology and Oncology

## 2021-04-24 NOTE — Telephone Encounter (Signed)
Scheduled per sch msg. Called and spoke with patient. Confirmed appt  

## 2021-04-26 ENCOUNTER — Other Ambulatory Visit: Payer: Self-pay | Admitting: Oncology

## 2021-04-26 ENCOUNTER — Inpatient Hospital Stay: Payer: BC Managed Care – PPO | Admitting: Hematology and Oncology

## 2021-04-26 ENCOUNTER — Telehealth: Payer: Self-pay | Admitting: Oncology

## 2021-04-26 NOTE — Progress Notes (Signed)
Pharmacist Chemotherapy Monitoring - Initial Assessment    Anticipated start date: 05/01/21   The following has been reviewed per standard work regarding the patient's treatment regimen: The patient's diagnosis, treatment plan and drug doses, and organ/hematologic function Lab orders and baseline tests specific to treatment regimen  The treatment plan start date, drug sequencing, and pre-medications Prior authorization status  Patient's documented medication list, including drug-drug interaction screen and prescriptions for anti-emetics and supportive care specific to the treatment regimen The drug concentrations, fluid compatibility, administration routes, and timing of the medications to be used The patient's access for treatment and lifetime cumulative dose history, if applicable  The patient's medication allergies and previous infusion related reactions, if applicable   Changes made to treatment plan:  N/A  Follow up needed:  N/A   Larene Beach, RPH, 04/26/2021  2:30 PM

## 2021-04-26 NOTE — Telephone Encounter (Signed)
Emma [name] regarding appointment today with Dr. Alvy Bimler.  She said she had tried to call and left a message to reschedule.  She is having stomach issues today.  Rescheduled appointment to tomorrow at 1:00.  Advised her to arrive at 83;30 to check in.

## 2021-04-27 ENCOUNTER — Emergency Department (HOSPITAL_COMMUNITY): Payer: BC Managed Care – PPO

## 2021-04-27 ENCOUNTER — Inpatient Hospital Stay (HOSPITAL_COMMUNITY)
Admission: EM | Admit: 2021-04-27 | Discharge: 2021-05-13 | DRG: 682 | Disposition: E | Payer: BC Managed Care – PPO | Source: Ambulatory Visit | Attending: Internal Medicine | Admitting: Internal Medicine

## 2021-04-27 ENCOUNTER — Inpatient Hospital Stay (HOSPITAL_BASED_OUTPATIENT_CLINIC_OR_DEPARTMENT_OTHER): Payer: BC Managed Care – PPO | Admitting: Hematology and Oncology

## 2021-04-27 ENCOUNTER — Encounter: Payer: Self-pay | Admitting: Hematology and Oncology

## 2021-04-27 ENCOUNTER — Inpatient Hospital Stay: Payer: BC Managed Care – PPO

## 2021-04-27 ENCOUNTER — Other Ambulatory Visit: Payer: Self-pay | Admitting: Student

## 2021-04-27 ENCOUNTER — Other Ambulatory Visit: Payer: Self-pay

## 2021-04-27 ENCOUNTER — Telehealth: Payer: Self-pay

## 2021-04-27 ENCOUNTER — Other Ambulatory Visit (HOSPITAL_COMMUNITY): Payer: Self-pay

## 2021-04-27 VITALS — BP 176/66 | HR 103 | Temp 97.1°F | Resp 18 | Ht 66.0 in | Wt 212.8 lb

## 2021-04-27 DIAGNOSIS — E875 Hyperkalemia: Secondary | ICD-10-CM | POA: Diagnosis present

## 2021-04-27 DIAGNOSIS — Z800 Family history of malignant neoplasm of digestive organs: Secondary | ICD-10-CM

## 2021-04-27 DIAGNOSIS — R579 Shock, unspecified: Secondary | ICD-10-CM | POA: Diagnosis not present

## 2021-04-27 DIAGNOSIS — Z8042 Family history of malignant neoplasm of prostate: Secondary | ICD-10-CM

## 2021-04-27 DIAGNOSIS — N179 Acute kidney failure, unspecified: Principal | ICD-10-CM | POA: Diagnosis present

## 2021-04-27 DIAGNOSIS — E874 Mixed disorder of acid-base balance: Secondary | ICD-10-CM | POA: Diagnosis not present

## 2021-04-27 DIAGNOSIS — C539 Malignant neoplasm of cervix uteri, unspecified: Secondary | ICD-10-CM | POA: Diagnosis present

## 2021-04-27 DIAGNOSIS — Z79899 Other long term (current) drug therapy: Secondary | ICD-10-CM | POA: Diagnosis not present

## 2021-04-27 DIAGNOSIS — Z170 Estrogen receptor positive status [ER+]: Secondary | ICD-10-CM

## 2021-04-27 DIAGNOSIS — E440 Moderate protein-calorie malnutrition: Secondary | ICD-10-CM | POA: Insufficient documentation

## 2021-04-27 DIAGNOSIS — Z9221 Personal history of antineoplastic chemotherapy: Secondary | ICD-10-CM | POA: Diagnosis not present

## 2021-04-27 DIAGNOSIS — Z20822 Contact with and (suspected) exposure to covid-19: Secondary | ICD-10-CM | POA: Diagnosis present

## 2021-04-27 DIAGNOSIS — K631 Perforation of intestine (nontraumatic): Secondary | ICD-10-CM | POA: Diagnosis present

## 2021-04-27 DIAGNOSIS — R188 Other ascites: Secondary | ICD-10-CM | POA: Diagnosis present

## 2021-04-27 DIAGNOSIS — Z6835 Body mass index (BMI) 35.0-35.9, adult: Secondary | ICD-10-CM | POA: Diagnosis not present

## 2021-04-27 DIAGNOSIS — T17908A Unspecified foreign body in respiratory tract, part unspecified causing other injury, initial encounter: Secondary | ICD-10-CM

## 2021-04-27 DIAGNOSIS — Z923 Personal history of irradiation: Secondary | ICD-10-CM | POA: Diagnosis not present

## 2021-04-27 DIAGNOSIS — K559 Vascular disorder of intestine, unspecified: Secondary | ICD-10-CM | POA: Diagnosis present

## 2021-04-27 DIAGNOSIS — I10 Essential (primary) hypertension: Secondary | ICD-10-CM | POA: Diagnosis present

## 2021-04-27 DIAGNOSIS — R140 Abdominal distension (gaseous): Secondary | ICD-10-CM

## 2021-04-27 DIAGNOSIS — Z803 Family history of malignant neoplasm of breast: Secondary | ICD-10-CM

## 2021-04-27 DIAGNOSIS — Z8541 Personal history of malignant neoplasm of cervix uteri: Secondary | ICD-10-CM

## 2021-04-27 DIAGNOSIS — C50412 Malignant neoplasm of upper-outer quadrant of left female breast: Secondary | ICD-10-CM

## 2021-04-27 DIAGNOSIS — G893 Neoplasm related pain (acute) (chronic): Secondary | ICD-10-CM | POA: Insufficient documentation

## 2021-04-27 DIAGNOSIS — Z66 Do not resuscitate: Secondary | ICD-10-CM | POA: Diagnosis not present

## 2021-04-27 DIAGNOSIS — E883 Tumor lysis syndrome: Secondary | ICD-10-CM

## 2021-04-27 DIAGNOSIS — N1330 Unspecified hydronephrosis: Secondary | ICD-10-CM | POA: Diagnosis present

## 2021-04-27 DIAGNOSIS — T85598A Other mechanical complication of other gastrointestinal prosthetic devices, implants and grafts, initial encounter: Secondary | ICD-10-CM

## 2021-04-27 DIAGNOSIS — Z452 Encounter for adjustment and management of vascular access device: Secondary | ICD-10-CM

## 2021-04-27 DIAGNOSIS — Z791 Long term (current) use of non-steroidal anti-inflammatories (NSAID): Secondary | ICD-10-CM

## 2021-04-27 DIAGNOSIS — Z781 Physical restraint status: Secondary | ICD-10-CM

## 2021-04-27 DIAGNOSIS — R109 Unspecified abdominal pain: Secondary | ICD-10-CM

## 2021-04-27 LAB — CBC WITH DIFFERENTIAL (CANCER CENTER ONLY)
Abs Immature Granulocytes: 1.34 10*3/uL — ABNORMAL HIGH (ref 0.00–0.07)
Basophils Absolute: 0 10*3/uL (ref 0.0–0.1)
Basophils Relative: 0 %
Eosinophils Absolute: 0 10*3/uL (ref 0.0–0.5)
Eosinophils Relative: 0 %
HCT: 29.1 % — ABNORMAL LOW (ref 36.0–46.0)
Hemoglobin: 9.5 g/dL — ABNORMAL LOW (ref 12.0–15.0)
Immature Granulocytes: 8 %
Lymphocytes Relative: 17 %
Lymphs Abs: 2.9 10*3/uL (ref 0.7–4.0)
MCH: 25.8 pg — ABNORMAL LOW (ref 26.0–34.0)
MCHC: 32.6 g/dL (ref 30.0–36.0)
MCV: 79.1 fL — ABNORMAL LOW (ref 80.0–100.0)
Monocytes Absolute: 0.7 10*3/uL (ref 0.1–1.0)
Monocytes Relative: 4 %
Neutro Abs: 12.5 10*3/uL — ABNORMAL HIGH (ref 1.7–7.7)
Neutrophils Relative %: 71 %
Platelet Count: 229 10*3/uL (ref 150–400)
RBC: 3.68 MIL/uL — ABNORMAL LOW (ref 3.87–5.11)
RDW: 16.2 % — ABNORMAL HIGH (ref 11.5–15.5)
WBC Count: 17.5 10*3/uL — ABNORMAL HIGH (ref 4.0–10.5)
nRBC: 2.9 % — ABNORMAL HIGH (ref 0.0–0.2)

## 2021-04-27 LAB — CMP (CANCER CENTER ONLY)
ALT: 24 U/L (ref 0–44)
AST: 121 U/L — ABNORMAL HIGH (ref 15–41)
Albumin: 2.4 g/dL — ABNORMAL LOW (ref 3.5–5.0)
Alkaline Phosphatase: 132 U/L — ABNORMAL HIGH (ref 38–126)
Anion gap: 26 — ABNORMAL HIGH (ref 5–15)
BUN: 82 mg/dL — ABNORMAL HIGH (ref 6–20)
CO2: 13 mmol/L — ABNORMAL LOW (ref 22–32)
Calcium: 9.9 mg/dL (ref 8.9–10.3)
Chloride: 97 mmol/L — ABNORMAL LOW (ref 98–111)
Creatinine: 3.52 mg/dL (ref 0.44–1.00)
GFR, Estimated: 15 mL/min — ABNORMAL LOW (ref >60)
Glucose, Bld: 117 mg/dL — ABNORMAL HIGH (ref 70–99)
Potassium: 6.9 mmol/L (ref 3.5–5.1)
Sodium: 136 mmol/L (ref 135–145)
Total Bilirubin: 0.7 mg/dL (ref 0.3–1.2)
Total Protein: 7.7 g/dL (ref 6.5–8.1)

## 2021-04-27 LAB — RESP PANEL BY RT-PCR (FLU A&B, COVID) ARPGX2
Influenza A by PCR: NEGATIVE
Influenza B by PCR: NEGATIVE
SARS Coronavirus 2 by RT PCR: NEGATIVE

## 2021-04-27 LAB — HIV ANTIBODY (ROUTINE TESTING W REFLEX): HIV Screen 4th Generation wRfx: NONREACTIVE

## 2021-04-27 LAB — CK: Total CK: 204 U/L (ref 38–234)

## 2021-04-27 LAB — MRSA NEXT GEN BY PCR, NASAL: MRSA by PCR Next Gen: NOT DETECTED

## 2021-04-27 LAB — MAGNESIUM: Magnesium: 2.6 mg/dL — ABNORMAL HIGH (ref 1.7–2.4)

## 2021-04-27 LAB — LACTIC ACID, PLASMA
Lactic Acid, Venous: >9 mmol/L (ref 0.5–1.9)
Lactic Acid, Venous: >9 mmol/L (ref 0.5–1.9)

## 2021-04-27 LAB — BLOOD GAS, VENOUS
Acid-base deficit: 12.7 mmol/L — ABNORMAL HIGH (ref 0.0–2.0)
Bicarbonate: 11.9 mmol/L — ABNORMAL LOW (ref 20.0–28.0)
O2 Saturation: 82.4 %
Patient temperature: 37
pCO2, Ven: 24 mmHg — ABNORMAL LOW (ref 44.0–60.0)
pH, Ven: 7.316 (ref 7.250–7.430)
pO2, Ven: 58 mmHg — ABNORMAL HIGH (ref 32.0–45.0)

## 2021-04-27 LAB — SAMPLE TO BLOOD BANK

## 2021-04-27 LAB — PHOSPHORUS: Phosphorus: 6.9 mg/dL — ABNORMAL HIGH (ref 2.5–4.6)

## 2021-04-27 LAB — CBG MONITORING, ED
Glucose-Capillary: 114 mg/dL — ABNORMAL HIGH (ref 70–99)
Glucose-Capillary: 122 mg/dL — ABNORMAL HIGH (ref 70–99)

## 2021-04-27 LAB — ABO/RH: ABO/RH(D): O POS

## 2021-04-27 LAB — URIC ACID: Uric Acid, Serum: >21 mg/dL — ABNORMAL HIGH (ref 2.5–7.1)

## 2021-04-27 LAB — POTASSIUM: Potassium: 5.9 mmol/L — ABNORMAL HIGH (ref 3.5–5.1)

## 2021-04-27 MED ORDER — HEPARIN SODIUM (PORCINE) 5000 UNIT/ML IJ SOLN
5000.0000 [IU] | Freq: Three times a day (TID) | INTRAMUSCULAR | Status: DC
  Administered 2021-04-27 – 2021-04-28 (×4): 5000 [IU] via SUBCUTANEOUS
  Filled 2021-04-27 (×4): qty 1

## 2021-04-27 MED ORDER — SODIUM ZIRCONIUM CYCLOSILICATE 10 G PO PACK: 10.0000 g | PACK | Freq: Once | ORAL | Status: DC

## 2021-04-27 MED ORDER — SODIUM CHLORIDE 0.9 % IV SOLN: INTRAVENOUS | Status: DC

## 2021-04-27 MED ORDER — SODIUM CHLORIDE 0.9 % IV BOLUS
1000.0000 mL | Freq: Once | INTRAVENOUS | Status: AC
  Administered 2021-04-27: 1000 mL via INTRAVENOUS

## 2021-04-27 MED ORDER — CALCIUM GLUCONATE 10 % IV SOLN
1.0000 g | Freq: Once | INTRAVENOUS | Status: AC
  Administered 2021-04-27: 1 g via INTRAVENOUS
  Filled 2021-04-27: qty 10

## 2021-04-27 MED ORDER — LIDOCAINE-PRILOCAINE 2.5-2.5 % EX CREA
TOPICAL_CREAM | CUTANEOUS | 3 refills | Status: AC
  Filled 2021-04-27: qty 30, 30d supply, fill #0

## 2021-04-27 MED ORDER — OXYCODONE HCL 5 MG PO TABS
10.0000 mg | ORAL_TABLET | ORAL | Status: DC | PRN
  Administered 2021-04-28 (×2): 10 mg via ORAL
  Filled 2021-04-27 (×2): qty 2

## 2021-04-27 MED ORDER — PROCHLORPERAZINE EDISYLATE 10 MG/2ML IJ SOLN
10.0000 mg | INTRAMUSCULAR | Status: DC | PRN
  Administered 2021-04-28: 10 mg via INTRAVENOUS
  Filled 2021-04-27: qty 2

## 2021-04-27 MED ORDER — ACETAMINOPHEN 650 MG RE SUPP: 650.0000 mg | Freq: Four times a day (QID) | RECTAL | Status: DC | PRN

## 2021-04-27 MED ORDER — INSULIN ASPART 100 UNIT/ML IV SOLN
5.0000 [IU] | Freq: Once | INTRAVENOUS | Status: AC
  Administered 2021-04-27: 5 [IU] via INTRAVENOUS
  Filled 2021-04-27: qty 0.05

## 2021-04-27 MED ORDER — OXYCODONE HCL 10 MG PO TABS
10.0000 mg | ORAL_TABLET | ORAL | 0 refills | Status: AC | PRN
  Filled 2021-04-27: qty 30, 5d supply, fill #0

## 2021-04-27 MED ORDER — CHLORHEXIDINE GLUCONATE CLOTH 2 % EX PADS
6.0000 | MEDICATED_PAD | Freq: Every day | CUTANEOUS | Status: DC
  Administered 2021-04-27 – 2021-04-28 (×2): 6 via TOPICAL

## 2021-04-27 MED ORDER — ORAL CARE MOUTH RINSE
15.0000 mL | Freq: Two times a day (BID) | OROMUCOSAL | Status: DC
  Administered 2021-04-27: 15 mL via OROMUCOSAL

## 2021-04-27 MED ORDER — SODIUM CHLORIDE 0.9% FLUSH
3.0000 mL | Freq: Two times a day (BID) | INTRAVENOUS | Status: DC
  Administered 2021-04-27 – 2021-04-28 (×2): 3 mL via INTRAVENOUS

## 2021-04-27 MED ORDER — ACETAMINOPHEN 325 MG PO TABS
650.0000 mg | ORAL_TABLET | Freq: Four times a day (QID) | ORAL | Status: DC | PRN
  Administered 2021-04-28: 650 mg via ORAL
  Filled 2021-04-27: qty 2

## 2021-04-27 MED ORDER — PROCHLORPERAZINE MALEATE 10 MG PO TABS
10.0000 mg | ORAL_TABLET | Freq: Four times a day (QID) | ORAL | 1 refills | Status: AC | PRN
  Filled 2021-04-27: qty 30, 8d supply, fill #0

## 2021-04-27 MED ORDER — ALBUTEROL SULFATE (2.5 MG/3ML) 0.083% IN NEBU
10.0000 mg | INHALATION_SOLUTION | Freq: Once | RESPIRATORY_TRACT | Status: AC
  Administered 2021-04-27: 10 mg via RESPIRATORY_TRACT
  Filled 2021-04-27: qty 12

## 2021-04-27 MED ORDER — DEXTROSE 50 % IV SOLN
1.0000 | Freq: Once | INTRAVENOUS | Status: AC
  Administered 2021-04-27: 50 mL via INTRAVENOUS
  Filled 2021-04-27: qty 50

## 2021-04-27 MED ORDER — POLYETHYLENE GLYCOL 3350 17 G PO PACK: 17.0000 g | PACK | Freq: Every day | ORAL | Status: DC | PRN

## 2021-04-27 MED ORDER — SODIUM ZIRCONIUM CYCLOSILICATE 10 G PO PACK
10.0000 g | PACK | Freq: Three times a day (TID) | ORAL | Status: DC
  Administered 2021-04-27 – 2021-04-28 (×4): 10 g via ORAL
  Filled 2021-04-27 (×4): qty 1

## 2021-04-27 MED ORDER — SODIUM BICARBONATE 8.4 % IV SOLN
Freq: Once | INTRAVENOUS | Status: AC
  Filled 2021-04-27: qty 150

## 2021-04-27 MED ORDER — ONDANSETRON HCL 8 MG PO TABS
8.0000 mg | ORAL_TABLET | Freq: Three times a day (TID) | ORAL | 1 refills | Status: AC | PRN
  Filled 2021-04-27: qty 18, 21d supply, fill #0

## 2021-04-27 NOTE — H&P (Signed)
History and Physical    Emma [name] WUJ:[id number] DOB: Nov 04, 1965 DOA: 04/26/2021  PCP: [facility]  Patient coming from: Home/cancer center  Chief Complaint: Abnormal labs with elevated potassium and creatinine, sent from cancer center.  Complaints of abdominal distention.  HPI: Emma [name] is a 55 y.o. female with medical history significant of remote history of breast cancer, cervical cancer under current treatment, hypertension who presented to the cancer center earlier today.  She complains of feeling poorly overall, with abdominal fullness and distention, has not been able to sleep well, has not been able to eat much or drink fluids at home.  She denies any recent fevers, chest pain or shortness of breath, denies any cough, nausea, vomiting, dysuria.  At the cancer center, they completed lab work and immediately sent her to the emergency department after finding lab work listed below.  ED Course: Labs revealed creatinine 3.52, potassium 6.9 and patient was sent to the emergency department.  Case was discussed with nephrology who gave recommendations via phone.  Patient was given albuterol, insulin/dextrose, IV calcium gluconate, oral Lokelma, IV fluid with bicarb.  Review of Systems: As per HPI. Otherwise, all other review of systems reviewed and are negative.   Past Medical History:  Diagnosis Date   Breast cancer of upper-outer quadrant of left female breast (HCC) 07/13/2015   Heart murmur    "nothing to worry about"   Hypertension    Personal history of chemotherapy 2017   Personal history of radiation therapy 2017    Past Surgical History:  Procedure Laterality Date   BREAST BIOPSY Left 07/10/2015   BREAST LUMPECTOMY Left 12/01/2015   LEFT BREAST SEED GUIDED LUMPECTOMY WITH LEFT AXILLARY NODE DISSECTION  (Left)   CESAREAN SECTION  1998   COLONOSCOPY W/ POLYPECTOMY     DILATION AND EVACUATION  05/01/2011   Procedure:  DILATATION AND EVACUATION;  Surgeon: Levi Aland;  Location: [facility];  Service: Gynecology;  Laterality: N/A;   FOOT SURGERY Bilateral 1993   bunionectomy   PORT-A-CATH REMOVAL  12/01/2015   PORT-A-CATH REMOVAL Right 12/01/2015   Procedure: REMOVAL PORT-A-CATH;  Surgeon: [name], MD;  Location: [facility] OR;  Service: General;  Laterality: Right;   PORTACATH PLACEMENT Right 07/31/2015   Procedure: INSERTION PORT-A-CATH;  Surgeon: [name], MD;  Location: [facility];  Service: General;  Laterality: Right;   RADIOACTIVE SEED GUIDED PARTIAL MASTECTOMY/AXILLARY SENTINEL NODE BIOPSY/AXILLARY NODE DISSECTION Left 12/01/2015   Procedure: LEFT BREAST SEED GUIDED LUMPECTOMY WITH LEFT AXILLARY NODE DISSECTION;  Surgeon: [name], MD;  Location: [facility] OR;  Service: General;  Laterality: Left;     reports that she has never smoked. She has never used smokeless tobacco. She reports that she does not drink alcohol and does not use drugs.  No Known Allergies  Family History  Problem Relation Age of Onset   Colon cancer Mother    Prostate cancer Brother    Ovarian cancer Other    Breast cancer Other    Prostate cancer Other    Endometrial cancer Other    Pancreatic cancer Other      Prior to Admission medications   Medication Sig Start Date End Date Taking? Authorizing Provider  acetaminophen (TYLENOL) 500 MG tablet Take 500 mg by mouth every 6 (six) hours as needed for moderate pain.    [provider]  amLODipine (NORVASC) 10 MG tablet Take 10 mg by mouth daily. 08/21/15   [provider]  cyclobenzaprine (FLEXERIL) 5 MG tablet Take 5 mg by mouth 3 (three) times daily as needed for muscle spasms. 04/04/21   [provider]  gabapentin (NEURONTIN) 300 MG capsule Take 1 capsule (300 mg total) by mouth at bedtime. Patient not taking: Reported on 04/26/2021 09/24/17   Magrinat, Valentino Hue, MD  lidocaine-prilocaine (EMLA) cream Apply to affected area once as  directed 05/02/2021   Artis Delay, MD  meloxicam (MOBIC) 15 MG tablet Take 15 mg by mouth daily as needed for pain. 04/06/21   [provider]  ondansetron (ZOFRAN) 8 MG tablet Take 1 tablet (8 mg total) by mouth every 8 (eight) hours as needed. 04/18/2021   Artis Delay, MD  Oxycodone HCl 10 MG TABS Take 1 tablet (10 mg total) by mouth every 4 (four) hours as needed for severe pain. 05/06/2021   Artis Delay, MD  prochlorperazine (COMPAZINE) 10 MG tablet Take 1 tablet (10 mg total) by mouth every 6 (six) hours as needed (Nausea or vomiting). 05/10/2021   Artis Delay, MD    Physical Exam: Vitals:   04/20/2021 1549 04/15/2021 1600 04/18/2021 1615 05/12/2021 1630  BP: 107/72 104/68  116/73  Pulse: 94 90 92 99  Resp: (!) 33 (!) 31 (!) 24 (!) 38  Temp:      TempSrc:      SpO2: 95% 94% 94% 94%    Constitutional: NAD, calm, but uncomfortable, in mild distress  Eyes: PERRL, lids and conjunctivae normal Respiratory: Clear to auscultation bilaterally, no wheezing, no crackles.  Tachypneic, mildly increased in respiratory effort, on room air, no accessory muscle use, but does have some conversational dyspnea. Cardiovascular: Tachycardic rate, regular rhythm Abdomen: Soft, distended, nontender to palpation Musculoskeletal: No joint deformity upper and lower extremities. No contractures. Normal muscle tone.  Skin: no rashes, lesions, ulcers on exposed skin  Neurologic: Alert and oriented, speech fluent, CN 2-12 grossly intact. No focal deficits.   Psychiatric: Normal judgment and insight. Normal mood and affect   Labs on Admission: I have personally reviewed following labs and imaging studies  CBC: Recent Labs  Lab 04/25/2021 1328  WBC 17.5*  NEUTROABS 12.5*  HGB 9.5*  HCT 29.1*  MCV 79.1*  PLT 229   Basic Metabolic Panel: Recent Labs  Lab 05/10/2021 1328  NA 136  K 6.9*  CL 97*  CO2 13*  GLUCOSE 117*  BUN 82*  CREATININE 3.52*  CALCIUM 9.9  MG 2.6*   GFR: Estimated Creatinine  Clearance: 20.9 mL/min (A) (by C-G formula based on SCr of 3.52 mg/dL [facility])). Liver Function Tests: Recent Labs  Lab 05/10/2021 1328  AST 121*  ALT 24  ALKPHOS 132*  BILITOT 0.7  PROT 7.7  ALBUMIN 2.4*   No results for input(s): LIPASE, AMYLASE in the last 168 hours. No results for input(s): AMMONIA in the last 168 hours. Coagulation Profile: No results for input(s): INR, PROTIME in the last 168 hours. Cardiac Enzymes: No results for input(s): CKTOTAL, CKMB, CKMBINDEX, TROPONINI in the last 168 hours. BNP (last 3 results) No results for input(s): PROBNP in the last 8760 hours. HbA1C: No results for input(s): HGBA1C in the last 72 hours. CBG: Recent Labs  Lab 04/26/2021 1610  GLUCAP 114*   Lipid Profile: No results for input(s): CHOL, HDL, LDLCALC, TRIG, CHOLHDL, LDLDIRECT in the last 72 hours. Thyroid Function Tests: No results for input(s): TSH, T4TOTAL, FREET4, T3FREE, THYROIDAB in the last 72 hours. Anemia Panel: No results for input(s): VITAMINB12, FOLATE, FERRITIN, TIBC, IRON, RETICCTPCT in the  last 72 hours. Urine analysis: No results found for: COLORURINE, APPEARANCEUR, LABSPEC, PHURINE, GLUCOSEU, HGBUR, BILIRUBINUR, KETONESUR, PROTEINUR, UROBILINOGEN, NITRITE, LEUKOCYTESUR Sepsis Labs: !!!!!!!!!!!!!!!!!!!!!!!!!!!!!!!!!!!!!!!!!!!! @LABRCNTIP (procalcitonin:4,lacticidven:4) )No results found for this or any previous visit (from the past 240 hour(s)).   Radiological Exams on Admission: DG Chest Portable 1 View  Result Date: 05/09/2021 CLINICAL DATA:  Kidney failure.  Denies chest complaints. EXAM: PORTABLE CHEST 1 VIEW COMPARISON:  CT chest dated April 19, 2021. Chest x-ray dated Sep 24, 2016. FINDINGS: The heart size and mediastinal contours are within normal limits. Both lungs are clear. Mild elevation of the right hemidiaphragm. The visualized skeletal structures are unremarkable. IMPRESSION: No active disease. Electronically Signed   By: Obie Dredge M.D.   On:  05/12/2021 16:02    EKG: Independently reviewed.  Normal sinus rhythm with rate 94, peaked T waves seen in leads I, 2, V2 as well as lateral leads  Assessment/Plan Principal Problem:   Hyperkalemia Active Problems:   Cervical cancer (HCC)   Cancer associated pain   Acute renal failure (ARF) (HCC)   Severe hyperkalemia and acute kidney injury -ED physician spoke with nephrology for recommendations including temporizing measures.  Patient was given IV calcium gluconate, albuterol, insulin/dextrose, IV fluid with bicarb, Lokelma with repeat potassium pending this evening.  Also recommended renal ultrasound.  If renal failure persists, or does not improve, call nephrology back in the morning for formal consultation -I will also order uric acid and phosphorus level to check for tumor lysis syndrome -Renal ultrasound is pending -Monitor closely on telemetry  Cervical cancer -Followed by Dr. Bertis Ruddy, has port placement and chemotherapy arranged for next week  Leukocytosis -No acute sign of infection, continue to monitor  Hypertension -Hold Norvasc marginal blood pressure and to avoid hypotension  Chronic pain -Continue oxycodone   DVT prophylaxis: Subcutaneous heparin  Code Status: Full code, confirmed with patient at time of admission Family Communication: No family at bedside Disposition Plan: Pending clinical improvement, suspect patient to return home Consults called: Nephrology curb-side   Severity of Illness: The appropriate patient status for this patient is INPATIENT. Inpatient status is judged to be reasonable and necessary in order to provide the required intensity of service to ensure the patient's safety. The patient's presenting symptoms, physical exam findings, and initial radiographic and laboratory data in the context of their chronic comorbidities is felt to place them at high risk for further clinical deterioration. Furthermore, it is not anticipated that the  patient will be medically stable for discharge from the hospital within 2 midnights of admission.   * I certify that at the point of admission it is my clinical judgment that the patient will require inpatient hospital care spanning beyond 2 midnights from the point of admission due to high intensity of service, high risk for further deterioration and high frequency of surveillance required.Noralee Stain, DO [facility] 04/16/2021, 4:49 PM   Available via Epic secure chat 7am-7pm After these hours, please refer to coverage provider listed on [email or web address]

## 2021-04-27 NOTE — ED Notes (Signed)
Bladder scanned pt read 21 ml

## 2021-04-27 NOTE — ED Triage Notes (Signed)
PT sent from oncology for admission after labs resulted with abnormal elevated potassium and creatinine. PT c/o discomfort from abdominal distension. Hx cervical cancer.

## 2021-04-27 NOTE — ED Provider Notes (Signed)
[facility]-EMERGENCY DEPT Provider Note   CSN: [id number] Arrival date & time: 04/30/2021  1505     History Chief Complaint  Patient presents with   Abnormal Lab    Emma [name] is a 55 y.o. female.  HPI 55 year old female presents after the cancer center called and told him to come back to the hospital due to abnormal lab work.  The patient was getting of visit prior to starting chemotherapy.  Labs today revealed acute kidney failure and hyperkalemia with potassium of 6.9.  Patient endorses decreased food intake, and sometimes regurgitation/vomiting.  Has been trying to keep down fluids.  No diarrhea.  Denies dysuria, fever, shortness of breath or chest pain.  No altered mental status per the husband.  She states she pees when she drinks though maybe she is urinating less.  Past Medical History:  Diagnosis Date   Breast cancer of upper-outer quadrant of left female breast (HCC) 07/13/2015   Heart murmur    "nothing to worry about"   Hypertension    Personal history of chemotherapy 2017   Personal history of radiation therapy 2017    Patient Active Problem List   Diagnosis Date Noted   Cancer associated pain 04/14/2021   Protein-calorie malnutrition, moderate (HCC) 04/13/2021   Acute renal failure (ARF) (HCC) 04/12/2021   Cervical cancer (HCC) 04/20/2021   Lymphocytosis 03/31/2020   Obesity (BMI 35.0-39.9 without comorbidity) 03/31/2020   Malignant neoplasm of upper-outer quadrant of left breast in female, estrogen receptor positive (HCC) 07/13/2015    Past Surgical History:  Procedure Laterality Date   BREAST BIOPSY Left 07/10/2015   BREAST LUMPECTOMY Left 12/01/2015   LEFT BREAST SEED GUIDED LUMPECTOMY WITH LEFT AXILLARY NODE DISSECTION  (Left)   CESAREAN SECTION  1998   COLONOSCOPY W/ POLYPECTOMY     DILATION AND EVACUATION  05/01/2011   Procedure: DILATATION AND EVACUATION;  Surgeon: Levi Aland;  Location: [facility];  Service: Gynecology;   Laterality: N/A;   FOOT SURGERY Bilateral 1993   bunionectomy   PORT-A-CATH REMOVAL  12/01/2015   PORT-A-CATH REMOVAL Right 12/01/2015   Procedure: REMOVAL PORT-A-CATH;  Surgeon: [name], MD;  Location: [facility] OR;  Service: General;  Laterality: Right;   PORTACATH PLACEMENT Right 07/31/2015   Procedure: INSERTION PORT-A-CATH;  Surgeon: [name], MD;  Location:  SURGERY CENTER;  Service: General;  Laterality: Right;   RADIOACTIVE SEED GUIDED PARTIAL MASTECTOMY/AXILLARY SENTINEL NODE BIOPSY/AXILLARY NODE DISSECTION Left 12/01/2015   Procedure: LEFT BREAST SEED GUIDED LUMPECTOMY WITH LEFT AXILLARY NODE DISSECTION;  Surgeon: [name], MD;  Location: [facility] OR;  Service: General;  Laterality: Left;     OB History     Gravida  2   Para  2   Term      Preterm      AB      Living         SAB      IAB      Ectopic      Multiple      Live Births              Family History  Problem Relation Age of Onset   Colon cancer Mother    Prostate cancer Brother    Ovarian cancer Other    Breast cancer Other    Prostate cancer Other    Endometrial cancer Other    Pancreatic cancer Other     Social History   Tobacco Use   Smoking status:  Never   Smokeless tobacco: Never  Vaping Use   Vaping Use: Never used  Substance Use Topics   Alcohol use: No    Comment: social   Drug use: No    Home Medications Prior to Admission medications   Medication Sig Start Date End Date Taking? Authorizing Provider  acetaminophen (TYLENOL) 500 MG tablet Take 500 mg by mouth every 6 (six) hours as needed for moderate pain.    [provider]  amLODipine (NORVASC) 10 MG tablet Take 10 mg by mouth daily. 08/21/15   [provider]  cyclobenzaprine (FLEXERIL) 5 MG tablet Take 5 mg by mouth 3 (three) times daily as needed for muscle spasms. 04/04/21   [provider]  gabapentin (NEURONTIN) 300 MG capsule Take 1 capsule (300 mg total) by mouth at  bedtime. Patient not taking: Reported on 04/26/2021 09/24/17   Magrinat, Valentino Hue, MD  lidocaine-prilocaine (EMLA) cream Apply to affected area once as directed May 15, 2021   Artis Delay, MD  meloxicam (MOBIC) 15 MG tablet Take 15 mg by mouth daily as needed for pain. 04/06/21   [provider]  ondansetron (ZOFRAN) 8 MG tablet Take 1 tablet (8 mg total) by mouth every 8 (eight) hours as needed. 15-May-2021   Artis Delay, MD  Oxycodone HCl 10 MG TABS Take 1 tablet (10 mg total) by mouth every 4 (four) hours as needed for severe pain. 15-May-2021   Artis Delay, MD  prochlorperazine (COMPAZINE) 10 MG tablet Take 1 tablet (10 mg total) by mouth every 6 (six) hours as needed (Nausea or vomiting). 05-15-21   Artis Delay, MD    Allergies    Patient has no known allergies.  Review of Systems   Review of Systems  Constitutional:  Negative for fever.  Respiratory:  Negative for shortness of breath.   Cardiovascular:  Negative for chest pain and leg swelling.  Gastrointestinal:  Positive for abdominal distention, abdominal pain and vomiting. Negative for diarrhea.  Genitourinary:  Negative for dysuria.  All other systems reviewed and are negative.  Physical Exam Updated Vital Signs BP 123/90 (BP Location: Left Arm)    Pulse 98    Temp (!) 97.2 F (36.2 C) (Axillary)    Resp 20    LMP 09/08/2015    SpO2 92%   Physical Exam Vitals and nursing note reviewed.  Constitutional:      Appearance: She is well-developed. She is not diaphoretic.  HENT:     Head: Normocephalic and atraumatic.     Right Ear: External ear normal.     Left Ear: External ear normal.     Nose: Nose normal.     Mouth/Throat:     Mouth: Mucous membranes are dry.  Eyes:     General:        Right eye: No discharge.        Left eye: No discharge.  Cardiovascular:     Rate and Rhythm: Normal rate and regular rhythm.     Heart sounds: Normal heart sounds.  Pulmonary:     Effort: Pulmonary effort is normal. Tachypnea  present.     Breath sounds: Normal breath sounds.  Abdominal:     General: There is distension.     Palpations: Abdomen is soft.     Tenderness: There is abdominal tenderness.     Comments: Large mass to the inferior abdomen with diffuse tenderness.  Musculoskeletal:     Right lower leg: No edema.     Left lower leg:  No edema.  Skin:    General: Skin is warm and dry.  Neurological:     Mental Status: She is alert.  Psychiatric:        Mood and Affect: Mood is not anxious.   ED Results / Procedures / Treatments   Labs (all labs ordered are listed, but only abnormal results are displayed) Labs Reviewed  RESP PANEL BY RT-PCR (FLU A&B, COVID) ARPGX2  URINALYSIS, ROUTINE W REFLEX MICROSCOPIC  LACTIC ACID, PLASMA  LACTIC ACID, PLASMA  BLOOD GAS, VENOUS  CBG MONITORING, ED    EKG EKG Interpretation  Date/Time:  Friday April 27 2021 15:47:37 EST Ventricular Rate:  94 PR Interval:  144 QRS Duration: 87 QT Interval:  349 QTC Calculation: 437 R Axis:   24 Text Interpretation: Sinus rhythm Peaked T waves Confirmed by Pricilla Loveless [phone]) on 05/07/2021 3:54:54 PM  Radiology US RENAL  Result Date: 04/16/2021 CLINICAL DATA:  Acute renal insufficiency. EXAM: RENAL / URINARY TRACT ULTRASOUND COMPLETE COMPARISON:  CT scan April 19, 2021 FINDINGS: Right Kidney: Renal measurements: 10.4 x 4.7 x 5.3 cm = volume: 135.7 mL. Mild hydronephrosis. Left Kidney: Renal measurements: 12.0 x 5.2 x 5.2 cm = volume: 168 mL. Echogenicity within normal limits. No mass or hydronephrosis visualized. Bladder: Limited evaluation.  No significant distension. Other: Ascites. IMPRESSION: 1. Evaluation of the left kidney is limited. There is mild right hydronephrosis. No definite abnormalities on the left. 2. The bladder is poorly assessed due to lack of distention. 3. Ascites. Electronically Signed   By: Gerome Sam III M.D.   On: 04/16/2021 17:25   DG Chest Portable 1 View  Result Date:  04/26/2021 CLINICAL DATA:  Kidney failure.  Denies chest complaints. EXAM: PORTABLE CHEST 1 VIEW COMPARISON:  CT chest dated April 19, 2021. Chest x-ray dated Sep 24, 2016. FINDINGS: The heart size and mediastinal contours are within normal limits. Both lungs are clear. Mild elevation of the right hemidiaphragm. The visualized skeletal structures are unremarkable. IMPRESSION: No active disease. Electronically Signed   By: Obie Dredge M.D.   On: 05/06/2021 16:02    Procedures .Critical Care Performed by: Pricilla Loveless, MD Authorized by: Pricilla Loveless, MD   Critical care provider statement:    Critical care time (minutes):  35   Critical care time was exclusive of:  Separately billable procedures and treating other patients   Critical care was necessary to treat or prevent imminent or life-threatening deterioration of the following conditions:  Renal failure and cardiac failure   Critical care was time spent personally by me on the following activities:  Development of treatment plan with patient or surrogate, discussions with consultants, evaluation of patient's response to treatment, examination of patient, ordering and review of laboratory studies, ordering and review of radiographic studies, ordering and performing treatments and interventions, pulse oximetry, re-evaluation of patient's condition and review of old charts   Medications Ordered in ED Medications  sodium chloride 0.9 % bolus 1,000 mL (has no administration in time range)  insulin aspart (novoLOG) injection 5 Units (has no administration in time range)    And  dextrose 50 % solution 50 mL (has no administration in time range)  sodium bicarbonate 150 mEq in dextrose 5 % 1,150 mL infusion (has no administration in time range)  sodium zirconium cyclosilicate (LOKELMA) packet 10 g (has no administration in time range)  albuterol (PROVENTIL) (2.5 MG/3ML) 0.083% nebulizer solution 10 mg (has no administration in time range)     ED Course  I have reviewed the triage vital signs and the nursing notes.  Pertinent labs & imaging results that were available during my care of the patient were reviewed by me and considered in my medical decision making (see chart for details).  Clinical Course as of 04/17/2021 1835  Caleen Essex Apr 27, 2021  1609 I discussed with Dr. Arlean Hopping who agrees with temporizing measures. Advises 2-3 L of IVF, lokelma 10g TID until K<5.0 and repeat BMP. Check urine Na, Cr. Renal U/S, UA, and low K diet/renal diet. [SG]    Clinical Course User Index [SG] Pricilla Loveless, MD   MDM Rules/Calculators/A&P                         Patient is hemodynamically stable. I suspect her renal failure is primarily pre-renal. She looks dry. Given IV fluids, Bicarbonate, lokelma, insulin/glucose, albuterol. She will need admission and cardiac monitoring and frequent potassium checks. Hospitalist will admit, Dr. Alvino Chapel.     Final Clinical Impression(s) / ED Diagnoses Final diagnoses:  Hyperkalemia  Acute kidney injury [facility])    Rx / DC Orders ED Discharge Orders     None        Pricilla Loveless, MD 04/30/2021 2014

## 2021-04-27 NOTE — Plan of Care (Signed)

## 2021-04-27 NOTE — Assessment & Plan Note (Signed)
She has severe, acute renal failure, could be due to dehydration but hydronephrosis or tumor lysis syndrome cannot be excluded At the time of dictation, when she was found to have severe hyperkalemia and acute renal failure, she was directed to the emergency department to be admitted

## 2021-04-27 NOTE — Assessment & Plan Note (Signed)
She has severe, uncontrolled cancer pain I recommend starting her on narcotic prescription with oxycodone 10 mg to get her pain under control

## 2021-04-27 NOTE — Progress Notes (Signed)
[facility] FOLLOW-UP progress notes  Patient Care Team: Associates, [facility] as PCP - General (Family Medicine) [name], MD as Consulting Physician (General Surgery) Magrinat, Valentino Hue, MD as Consulting Physician (Oncology) Antony Blackbird, MD as Consulting Physician (Radiation Oncology) Loa Socks, NP as Nurse Practitioner (Hematology and Oncology) Kerin Salen, MD as Consulting Physician (Gastroenterology) Charlett Nose, MD as Consulting Physician (Obstetrics and Gynecology)  CHIEF COMPLAINTS/PURPOSE OF VISIT:  Newly diagnosed small cell cancer of the cervix  HISTORY OF PRESENTING ILLNESS:  Emma [name] 55 y.o. female was transferred to my care after she was found to have new GYN primary She has remote history of breast cancer status post chemotherapy, surgery, radiation treatment and antiestrogen therapy She presented with postmenopausal bleeding and had extensive work-up recently She underwent CT imaging study and biopsy which showed small cell cancer of the cervix She was supposed to see me yesterday but called to cancel her appointment due to feeling unwell At the time of evaluation, she was obviously uncomfortable with severe lower abdominal pain She continues to have intermittent vaginal bleeding Her appetite is poor She has some nausea but no vomiting She has some mild constipation She lives with her husband at home She denies smoking or drinking  I reviewed the patient's records extensive and collaborated the history with the patient. Summary of her history is as follows: Oncology History  Malignant neoplasm of upper-outer quadrant of left breast in female, estrogen receptor positive (HCC)  07/13/2015 Initial Diagnosis   Malignant neoplasm of upper-outer quadrant of left breast in female, estrogen receptor positive (HCC)   08/07/2015 - 10/12/2015 Neo-Adjuvant Chemotherapy   Docetaxel, cyclophosphamide every  3 weeks times 4.     12/01/2015 Surgery   Left lumpectomy Ezzard Standing): ILC, grade II, 4.7cm, margins negative, 2/13 lymph nodes positive, ypT2, ypN1a, ER+(70%), PR+(95%), HER-2 negative (ratio 1.11).     01/25/2016 - 03/09/2016 Radiation Therapy   At [facility]:  ----------------------------------------------------------------------------------------------------- LT breast/IMN, PWT 5000cGy 35 200cGy 9/14 - 02/28/16 ----------------------------------------------------------------------------------------------------- LT SCV/AX, RAO.LPO 5000cGy 35 200cGy 9/14 - 02/28/16 ----------------------------------------------------------------------------------------------------- LT TB boost, bouquet 1200cGy 8 200cGy 10/19 - 03/07/16  ----------------------------------------------------------------------------------------------------- Totals 6200cGy    03/2016 -  Anti-estrogen oral therapy   Tamoxifen daily   04/2016 Miscellaneous   PALLAS consent, signed not enrolled   05/01/2021 -  Chemotherapy   Patient is on Treatment Plan : Cervical SMALL CELL EXTENSIVE STAGE Cisplatin D1 + Etoposide D1-3 q21d x 4 cycles     Cervical cancer (HCC)  04/20/2021 Initial Diagnosis   Cervical cancer (HCC)   04/16/2021 Cancer Staging   Staging form: Cervix Uteri, AJCC Version 9 - Clinical stage from 04/18/2021: Stage IVB (cT3b, cN2, cM1) - Signed by Artis Delay, MD on 04/30/2021 Stage prefix: Initial diagnosis    05/01/2021 -  Chemotherapy   Patient is on Treatment Plan : Cervical SMALL CELL EXTENSIVE STAGE Cisplatin D1 + Etoposide D1-3 q21d x 4 cycles       MEDICAL HISTORY:  Past Medical History:  Diagnosis Date   Breast cancer of upper-outer quadrant of left female breast (HCC) 07/13/2015   Heart murmur    "nothing to worry about"   Hypertension    Personal history of chemotherapy 2017   Personal history of radiation therapy 2017    SURGICAL HISTORY: Past Surgical History:  Procedure Laterality Date    BREAST BIOPSY Left 07/10/2015   BREAST LUMPECTOMY Left 12/01/2015   LEFT BREAST SEED GUIDED LUMPECTOMY WITH LEFT AXILLARY  NODE DISSECTION  (Left)   CESAREAN SECTION  1998   COLONOSCOPY W/ POLYPECTOMY     DILATION AND EVACUATION  05/01/2011   Procedure: DILATATION AND EVACUATION;  Surgeon: Levi Aland;  Location: [facility];  Service: Gynecology;  Laterality: N/A;   FOOT SURGERY Bilateral 1993   bunionectomy   PORT-A-CATH REMOVAL  12/01/2015   PORT-A-CATH REMOVAL Right 12/01/2015   Procedure: REMOVAL PORT-A-CATH;  Surgeon: [name], MD;  Location: [facility] OR;  Service: General;  Laterality: Right;   PORTACATH PLACEMENT Right 07/31/2015   Procedure: INSERTION PORT-A-CATH;  Surgeon: [name], MD;  Location:  SURGERY CENTER;  Service: General;  Laterality: Right;   RADIOACTIVE SEED GUIDED PARTIAL MASTECTOMY/AXILLARY SENTINEL NODE BIOPSY/AXILLARY NODE DISSECTION Left 12/01/2015   Procedure: LEFT BREAST SEED GUIDED LUMPECTOMY WITH LEFT AXILLARY NODE DISSECTION;  Surgeon: [name], MD;  Location: [facility] OR;  Service: General;  Laterality: Left;    SOCIAL HISTORY: Social History   Socioeconomic History   Marital status: Married    Spouse name: Not on file   Number of children: Not on file   Years of education: Not on file   Highest education level: Not on file  Occupational History   Not on file  Tobacco Use   Smoking status: Never   Smokeless tobacco: Never  Vaping Use   Vaping Use: Never used  Substance and Sexual Activity   Alcohol use: No    Comment: social   Drug use: No   Sexual activity: Not Currently  Other Topics Concern   Not on file  Social History Narrative   Not on file   Social Determinants of Health   Financial Resource Strain: Not on file  Food Insecurity: Not on file  Transportation Needs: Not on file  Physical Activity: Not on file  Stress: Not on file  Social Connections: Not on file  Intimate Partner Violence: Not on file    FAMILY  HISTORY: Family History  Problem Relation Age of Onset   Colon cancer Mother    Prostate cancer Brother    Ovarian cancer Other    Breast cancer Other    Prostate cancer Other    Endometrial cancer Other    Pancreatic cancer Other     ALLERGIES:  has No Known Allergies.  MEDICATIONS:  Current Outpatient Medications  Medication Sig Dispense Refill   Oxycodone HCl 10 MG TABS Take 1 tablet (10 mg total) by mouth every 4 (four) hours as needed for severe pain. 30 tablet 0   acetaminophen (TYLENOL) 500 MG tablet Take 500 mg by mouth every 6 (six) hours as needed for moderate pain.     amLODipine (NORVASC) 10 MG tablet Take 10 mg by mouth daily.  0   cyclobenzaprine (FLEXERIL) 5 MG tablet Take 5 mg by mouth 3 (three) times daily as needed for muscle spasms.     gabapentin (NEURONTIN) 300 MG capsule Take 1 capsule (300 mg total) by mouth at bedtime. (Patient not taking: Reported on 04/26/2021) 90 capsule 4   lidocaine-prilocaine (EMLA) cream Apply to affected area once as directed 30 g 3   meloxicam (MOBIC) 15 MG tablet Take 15 mg by mouth daily as needed for pain.     ondansetron (ZOFRAN) 8 MG tablet Take 1 tablet (8 mg total) by mouth every 8 (eight) hours as needed. 30 tablet 1   prochlorperazine (COMPAZINE) 10 MG tablet Take 1 tablet (10 mg total) by mouth every 6 (six) hours as needed (Nausea or vomiting).  30 tablet 1   No current facility-administered medications for this visit.    REVIEW OF SYSTEMS:   Constitutional: Denies fevers, chills or abnormal night sweats Eyes: Denies blurriness of vision, double vision or watery eyes Ears, nose, mouth, throat, and face: Denies mucositis or sore throat Respiratory: Denies cough, dyspnea or wheezes Cardiovascular: Denies palpitation, chest discomfort or lower extremity swelling Skin: Denies abnormal skin rashes Lymphatics: Denies new lymphadenopathy or easy bruising Neurological:Denies numbness, tingling or new  weaknesses Behavioral/Psych: Mood is stable, no new changes  All other systems were reviewed with the patient and are negative.  PHYSICAL EXAMINATION: ECOG PERFORMANCE STATUS: 2 - Symptomatic, <50% confined to bed  Vitals:   05/07/21 1250  BP: (!) 176/66  Pulse: (!) 103  Resp: 18  Temp: (!) 97.1 F (36.2 C)  SpO2: 94%   Filed Weights   May 07, 2021 1250  Weight: 212 lb 12.8 oz (96.5 kg)    GENERAL:alert, in mild distress from pain and appears uncomfortable SKIN: skin color, texture, turgor are normal, no rashes or significant lesions EYES: normal, conjunctiva are pink and non-injected, sclera clear OROPHARYNX:no exudate, normal lips, buccal mucosa, and tongue  NECK: supple, thyroid normal size, non-tender, without nodularity LYMPH:  no palpable lymphadenopathy in the cervical, axillary or inguinal LUNGS: clear to auscultation and percussion with normal breathing effort HEART: She is tachycardic, no murmurs without lower extremity edema ABDOMEN:abdomen is profoundly distended with pelvic mass Musculoskeletal:no cyanosis of digits and no clubbing  PSYCH: alert & oriented x 3 with fluent speech NEURO: no focal motor/sensory deficits  LABORATORY DATA:  I have reviewed the data as listed Lab Results  Component Value Date   WBC 17.5 (H) May 07, 2021   HGB 9.5 (L) May 07, 2021   HCT 29.1 (L) 05-07-2021   MCV 79.1 (L) May 07, 2021   PLT 229 05/07/2021   Recent Labs    02/28/21 1335 04/19/21 1518 05/07/21 1328  NA 142  --  136  K 3.5  --  6.9*  CL 106  --  97*  CO2 23  --  13*  GLUCOSE 83  --  117*  BUN 14  --  82*  CREATININE 1.06* 1.40* 3.52*  CALCIUM 9.3  --  9.9  GFRNONAA >60  --  15*  PROT 7.8  --  7.7  ALBUMIN 3.8  --  2.4*  AST 32  --  121*  ALT 12  --  24  ALKPHOS 61  --  132*  BILITOT 0.6  --  0.7    RADIOGRAPHIC STUDIES: I have personally reviewed the radiological images as listed and agreed with the findings in the report. CT CHEST ABDOMEN PELVIS W  CONTRAST  Result Date: 04/20/2021 CLINICAL DATA:  Pelvic mass with ascites. Cervical mass on exam. History of breast cancer. Abdominal distension. EXAM: CT CHEST, ABDOMEN, AND PELVIS WITH CONTRAST TECHNIQUE: Multidetector CT imaging of the chest, abdomen and pelvis was performed following the standard protocol during bolus administration of intravenous contrast. CONTRAST:  80mL OMNIPAQUE IOHEXOL 300 MG/ML  SOLN COMPARISON:  07/27/2015 FINDINGS: CT CHEST FINDINGS Cardiovascular: The heart size is normal. No substantial pericardial effusion. Mild atherosclerotic calcification is noted in the wall of the thoracic aorta. Mediastinum/Nodes: No mediastinal lymphadenopathy. 1.5 x 3.1 cm soft tissue nodule identified just deep to the anterior right first and second ribs, likely in the internal mammary lymph node. There is no hilar lymphadenopathy. The esophagus has normal imaging features. There is no axillary lymphadenopathy. Surgical clips noted left axilla. Lungs/Pleura: No  suspicious pulmonary nodule or mass. No focal airspace consolidation. No pleural effusion. Musculoskeletal: No worrisome lytic or sclerotic osseous abnormality. CT ABDOMEN PELVIS FINDINGS Hepatobiliary: 2.7 cm heterogeneous lesion identified posterior right hepatic lobe (image 52/series 2). There is no evidence for gallstones, gallbladder wall thickening, or pericholecystic fluid. No intrahepatic or extrahepatic biliary dilation. Pancreas: No focal mass lesion. No dilatation of the main duct. No intraparenchymal cyst. No peripancreatic edema. Spleen: No splenomegaly. No focal mass lesion. Adrenals/Urinary Tract: No adrenal nodule or mass. Mild fullness noted intrarenal collecting system of both kidneys without substantial hydroureter. Bladder is nondistended. Stomach/Bowel: all thickening in the antral region of the stomach likely related to peristalsis. Duodenum is normally positioned as is the ligament of Treitz. No small bowel wall thickening. No  small bowel dilatation. The terminal ileum is normal. No gross colonic mass. No colonic wall thickening. Vascular/Lymphatic: No abdominal aortic aneurysm. Retroperitoneal lymphadenopathy noted with index left para-aortic node measuring 2.1 cm short axis on image 70/series 2. Index 1.8 cm short axis left external iliac node seen along the pelvic sidewall (image 101/2) with contralateral pelvic sidewall lymph nodes measuring up to 1.7 cm short axis (also 101/2). Reproductive: Uterus is markedly enlarged measuring 26.3 x 12.8 x 18.9 cm. Endometrium is markedly expanded up to 6.1 cm thickness on sagittal imaging. Cervix is not well evaluated on CT, but is markedly enlarged with obscuration of the cervical canal. Neither ovary discretely visible. Other: Moderate volume abdominopelvic ascites associated with innumerable mesenteric and peritoneal nodules. Lower omental caking visible with right paramidline omental nodule measuring 2.5 x 2.5 cm visible on 77/2. Another index omental nodule in the hepatic flexure measures 4.7 x 3.5 cm on 78/2. Omental nodule just deep to the umbilicus measures 5.4 x 3.2 cm on 85/2 and may involve the midline rectus sheath. Prominent peritoneal nodules are seen in the cul-de-sac and along the pelvic sidewall bilaterally. Musculoskeletal: No worrisome lytic or sclerotic osseous abnormality. IMPRESSION: 1. Markedly enlarged cervix and uterus with markedly expanded endometrium measuring up to 6.1 cm thickness. Imaging features highly suspicious for neoplasm and could be obstructive cervical carcinoma or primary endometrial lesion. 2. Moderate volume abdominopelvic ascites associated with innumerable mesenteric and peritoneal nodules, consistent with metastatic disease. 3. 2.7 cm heterogeneous lesion posterior right hepatic lobe, highly suspicious for metastatic disease. 4. Retroperitoneal and pelvic sidewall lymphadenopathy consistent with metastatic disease. 1.5 x 3.1 cm soft tissue nodule just  deep to the anterior right first and second ribs, likely metastatic disease involving internal mammary lymph node. 5.  Aortic Atherosclerois (ICD10-170.0) Electronically Signed   By: Kennith Center M.D.   On: 04/20/2021 11:02   US PELVIC COMPLETE WITH TRANSVAGINAL  Result Date: 04/11/2021 CLINICAL DATA:  Post menopausal bleeding EXAM: TRANSABDOMINAL AND TRANSVAGINAL ULTRASOUND OF PELVIS TECHNIQUE: Both transabdominal and transvaginal ultrasound examinations of the pelvis were performed. Transabdominal technique was performed for global imaging of the pelvis including uterus, ovaries, adnexal regions, and pelvic cul-de-sac. It was necessary to proceed with endovaginal exam following the transabdominal exam to visualize the uterus and adnexa. COMPARISON:  None FINDINGS: Uterus Measurements: 8.6 x 3.7 x 5.5 cm = volume: 101 mL. Poorly visualized on both transabdominal and transvaginal images. Large heterogeneous pelvic mass measuring 18 x 16 x 19 cm. Mass is in the region of the uterine fundus but extends to the right adnexa and to the left of midline. Endometrium Unable to measure, poorly visualized Right ovary Not seen with certainty. Left ovary Not seen with certainty. Other findings Moderate  free fluid within the pelvis with possible soft tissue nodules in the pelvic fluid. IMPRESSION: 1. Large heterogeneous pelvic mass measuring up to 19 cm concerning for neoplasm though exact origin of the mass (uterine versus adnexal) is indeterminate on this exam. Recommend gynecology consultation and further evaluation with pelvic MRI. 2. Moderate ascites in the pelvis with suspicion of soft tissue nodules within the ascites fluid. These results will be called to the ordering clinician or representative by the Radiologist Assistant, and communication documented in the PACS or [employer]. Electronically Signed   By: Jasmine Pang M.D.   On: 04/11/2021 16:20    ASSESSMENT & PLAN:  Cervical cancer (HCC) I have  reviewed her pathology with the patient and family We review her CT imaging She has significant rapid growth of her disease and symptomatic We have arranged for port placement and chemotherapy with cisplatin and etoposide to start next week We discussed the risk, benefits, side effects of treatment and she is in agreement to proceed At the time of dictation, she was found to have acute renal failure and is directed to the emergency department to be admitted and managed  Acute renal failure (ARF) (HCC) She has severe, acute renal failure, could be due to dehydration but hydronephrosis or tumor lysis syndrome cannot be excluded At the time of dictation, when she was found to have severe hyperkalemia and acute renal failure, she was directed to the emergency department to be admitted  Cancer associated pain She has severe, uncontrolled cancer pain I recommend starting her on narcotic prescription with oxycodone 10 mg to get her pain under control  Protein-calorie malnutrition, moderate (HCC) She has severe protein calorie malnutrition due to poor oral intake She needs dietitian review when she is seen in the hospital  Orders Placed This Encounter  Procedures   CBC with Differential (Cancer Center Only)    Standing Status:   Standing    Number of Occurrences:   20    Standing Expiration Date:   04/27/2022   CMP (Cancer Center only)    Standing Status:   Standing    Number of Occurrences:   20    Standing Expiration Date:   04/27/2022   Magnesium    Standing Status:   Standing    Number of Occurrences:   20    Standing Expiration Date:   04/27/2022   Amb Referral to Nutrition and Diabetic Education    Referral Priority:   Routine    Referral Type:   Consultation    Referral Reason:   Specialty Services Required    Number of Visits Requested:   1   ABO/Rh    Standing Status:   Future    Number of Occurrences:   1    Standing Expiration Date:   04/27/2022   Sample to Blood Bank     Standing Status:   Standing    Number of Occurrences:   33    Standing Expiration Date:   04/27/2022    All questions were answered. The patient knows to call the clinic with any problems, questions or concerns. The total time spent in the appointment was 80 minutes encounter with patients including review of chart and various tests results, discussions about plan of care and coordination of care plan   Artis Delay, MD 04/21/2021 3:16 PM

## 2021-04-27 NOTE — Progress Notes (Signed)
Critical lab lactic >9 called to J. Maylene Roes, DO

## 2021-04-27 NOTE — Assessment & Plan Note (Signed)
She has severe protein calorie malnutrition due to poor oral intake She needs dietitian review when she is seen in the hospital

## 2021-04-27 NOTE — Assessment & Plan Note (Signed)
I have reviewed her pathology with the patient and family We review her CT imaging She has significant rapid growth of her disease and symptomatic We have arranged for port placement and chemotherapy with cisplatin and etoposide to start next week We discussed the risk, benefits, side effects of treatment and she is in agreement to proceed At the time of dictation, she was found to have acute renal failure and is directed to the emergency department to be admitted and managed

## 2021-04-27 NOTE — Telephone Encounter (Signed)
CRITICAL VALUE STICKER  CRITICAL VALUE: Potassium 6.9 and Creatinine 3.52  RECEIVER (on-site recipient of call):Eleina Jergens, [place] NOTIFIED: [phone] 05/01/2021  MESSENGER (representative from lab): Lab staff  MD NOTIFIED: Dr. Alvy Bimler  TIME OF NOTIFICATION: 0502 05/10/2021  RESPONSE:  Send her to the ER at [facility] to be admitted.  Called and spoke with [name] and told per Dr. Alvy Bimler to go to the ER at [facility] to be admitted. She is in acute renal failure. She verbalized understanding.

## 2021-04-28 ENCOUNTER — Inpatient Hospital Stay (HOSPITAL_COMMUNITY): Payer: BC Managed Care – PPO

## 2021-04-28 ENCOUNTER — Inpatient Hospital Stay (HOSPITAL_COMMUNITY): Payer: BC Managed Care – PPO | Admitting: Registered Nurse

## 2021-04-28 DIAGNOSIS — E883 Tumor lysis syndrome: Secondary | ICD-10-CM

## 2021-04-28 DIAGNOSIS — E875 Hyperkalemia: Secondary | ICD-10-CM

## 2021-04-28 LAB — TYPE AND SCREEN
ABO/RH(D): O POS
Antibody Screen: NEGATIVE

## 2021-04-28 LAB — POTASSIUM
Potassium: 7 mmol/L (ref 3.5–5.1)
Potassium: 7.1 mmol/L (ref 3.5–5.1)
Potassium: 5.5 mmol/L — ABNORMAL HIGH (ref 3.5–5.1)

## 2021-04-28 LAB — COMPREHENSIVE METABOLIC PANEL
ALT: 25 U/L (ref 0–44)
AST: 99 U/L — ABNORMAL HIGH (ref 15–41)
Albumin: 2.5 g/dL — ABNORMAL LOW (ref 3.5–5.0)
Alkaline Phosphatase: 110 U/L (ref 38–126)
Anion gap: 20 — ABNORMAL HIGH (ref 5–15)
BUN: 88 mg/dL — ABNORMAL HIGH (ref 6–20)
CO2: 17 mmol/L — ABNORMAL LOW (ref 22–32)
Calcium: 8.5 mg/dL — ABNORMAL LOW (ref 8.9–10.3)
Chloride: 97 mmol/L — ABNORMAL LOW (ref 98–111)
Creatinine, Ser: 3.72 mg/dL — ABNORMAL HIGH (ref 0.44–1.00)
GFR, Estimated: 14 mL/min — ABNORMAL LOW (ref >60)
Glucose, Bld: 124 mg/dL — ABNORMAL HIGH (ref 70–99)
Potassium: 6.9 mmol/L (ref 3.5–5.1)
Sodium: 134 mmol/L — ABNORMAL LOW (ref 135–145)
Total Bilirubin: 0.9 mg/dL (ref 0.3–1.2)
Total Protein: 7.3 g/dL (ref 6.5–8.1)

## 2021-04-28 LAB — PROTIME-INR
INR: 1.5 — ABNORMAL HIGH (ref 0.8–1.2)
Prothrombin Time: 18 seconds — ABNORMAL HIGH (ref 11.4–15.2)

## 2021-04-28 LAB — BASIC METABOLIC PANEL
Anion gap: 20 — ABNORMAL HIGH (ref 5–15)
BUN: 62 mg/dL — ABNORMAL HIGH (ref 6–20)
CO2: 13 mmol/L — ABNORMAL LOW (ref 22–32)
Calcium: 7.8 mg/dL — ABNORMAL LOW (ref 8.9–10.3)
Chloride: 99 mmol/L (ref 98–111)
Creatinine, Ser: 3.34 mg/dL — ABNORMAL HIGH (ref 0.44–1.00)
GFR, Estimated: 16 mL/min — ABNORMAL LOW (ref >60)
Glucose, Bld: 195 mg/dL — ABNORMAL HIGH (ref 70–99)
Potassium: 5.8 mmol/L — ABNORMAL HIGH (ref 3.5–5.1)
Sodium: 132 mmol/L — ABNORMAL LOW (ref 135–145)

## 2021-04-28 LAB — CBC
HCT: 27.2 % — ABNORMAL LOW (ref 36.0–46.0)
HCT: 27 % — ABNORMAL LOW (ref 36.0–46.0)
Hemoglobin: 8.7 g/dL — ABNORMAL LOW (ref 12.0–15.0)
Hemoglobin: 8.1 g/dL — ABNORMAL LOW (ref 12.0–15.0)
MCH: 25.9 pg — ABNORMAL LOW (ref 26.0–34.0)
MCH: 25.7 pg — ABNORMAL LOW (ref 26.0–34.0)
MCHC: 32 g/dL (ref 30.0–36.0)
MCHC: 30 g/dL (ref 30.0–36.0)
MCV: 81 fL (ref 80.0–100.0)
MCV: 85.7 fL (ref 80.0–100.0)
Platelets: 203 10*3/uL (ref 150–400)
Platelets: 175 10*3/uL (ref 150–400)
RBC: 3.36 MIL/uL — ABNORMAL LOW (ref 3.87–5.11)
RBC: 3.15 MIL/uL — ABNORMAL LOW (ref 3.87–5.11)
RDW: 16.5 % — ABNORMAL HIGH (ref 11.5–15.5)
RDW: 17 % — ABNORMAL HIGH (ref 11.5–15.5)
WBC: 14.7 10*3/uL — ABNORMAL HIGH (ref 4.0–10.5)
WBC: 13.6 10*3/uL — ABNORMAL HIGH (ref 4.0–10.5)
nRBC: 2.8 % — ABNORMAL HIGH (ref 0.0–0.2)
nRBC: 6.4 % — ABNORMAL HIGH (ref 0.0–0.2)

## 2021-04-28 LAB — URINALYSIS, ROUTINE W REFLEX MICROSCOPIC
Bilirubin Urine: NEGATIVE
Glucose, UA: NEGATIVE mg/dL
Ketones, ur: 5 mg/dL — AB
Nitrite: NEGATIVE
Protein, ur: 30 mg/dL — AB
RBC / HPF: >50 RBC/hpf — ABNORMAL HIGH (ref 0–5)
Specific Gravity, Urine: 1.021 (ref 1.005–1.030)
WBC, UA: >50 WBC/hpf — ABNORMAL HIGH (ref 0–5)
pH: 5 (ref 5.0–8.0)

## 2021-04-28 LAB — BLOOD GAS, ARTERIAL
Acid-base deficit: 17.2 mmol/L — ABNORMAL HIGH (ref 0.0–2.0)
Bicarbonate: 11.6 mmol/L — ABNORMAL LOW (ref 20.0–28.0)
Drawn by: 59133
FIO2: 100
MECHVT: 470 mL
O2 Saturation: 93.2 %
PEEP: 5 cmH2O
Patient temperature: 96.9
RATE: 26 resp/min
pCO2 arterial: 39.7 mmHg (ref 32.0–48.0)
pH, Arterial: 7.085 — CL (ref 7.350–7.450)
pO2, Arterial: 103 mmHg (ref 83.0–108.0)

## 2021-04-28 LAB — RENAL FUNCTION PANEL
Albumin: 2.7 g/dL — ABNORMAL LOW (ref 3.5–5.0)
Anion gap: 19 — ABNORMAL HIGH (ref 5–15)
BUN: 64 mg/dL — ABNORMAL HIGH (ref 6–20)
CO2: 17 mmol/L — ABNORMAL LOW (ref 22–32)
Calcium: 8.1 mg/dL — ABNORMAL LOW (ref 8.9–10.3)
Chloride: 96 mmol/L — ABNORMAL LOW (ref 98–111)
Creatinine, Ser: 3.02 mg/dL — ABNORMAL HIGH (ref 0.44–1.00)
GFR, Estimated: 18 mL/min — ABNORMAL LOW (ref >60)
Glucose, Bld: 105 mg/dL — ABNORMAL HIGH (ref 70–99)
Phosphorus: 5.3 mg/dL — ABNORMAL HIGH (ref 2.5–4.6)
Potassium: 5.5 mmol/L — ABNORMAL HIGH (ref 3.5–5.1)
Sodium: 132 mmol/L — ABNORMAL LOW (ref 135–145)

## 2021-04-28 LAB — LACTIC ACID, PLASMA
Lactic Acid, Venous: 6.7 mmol/L (ref 0.5–1.9)
Lactic Acid, Venous: 6.3 mmol/L (ref 0.5–1.9)
Lactic Acid, Venous: 5.7 mmol/L (ref 0.5–1.9)

## 2021-04-28 LAB — CREATININE, URINE, RANDOM: Creatinine, Urine: 253.77 mg/dL

## 2021-04-28 LAB — SODIUM, URINE, RANDOM: Sodium, Ur: <10 mmol/L

## 2021-04-28 LAB — APTT: aPTT: 51 seconds — ABNORMAL HIGH (ref 24–36)

## 2021-04-28 LAB — GLUCOSE, CAPILLARY: Glucose-Capillary: 161 mg/dL — ABNORMAL HIGH (ref 70–99)

## 2021-04-28 MED ORDER — SODIUM BICARBONATE 8.4 % IV SOLN
INTRAVENOUS | Status: DC
  Filled 2021-04-28: qty 150

## 2021-04-28 MED ORDER — DEXTROSE 50 % IV SOLN
1.0000 | Freq: Once | INTRAVENOUS | Status: AC
  Administered 2021-04-28: 50 mL via INTRAVENOUS
  Filled 2021-04-28: qty 50

## 2021-04-28 MED ORDER — PRISMASOL BGK 0/2.5 32-2.5 MEQ/L REPLACEMENT SOLN: Status: DC

## 2021-04-28 MED ORDER — ALTEPLASE 2 MG IJ SOLR: 2.0000 mg | Freq: Once | INTRAMUSCULAR | Status: DC | PRN

## 2021-04-28 MED ORDER — HEPARIN SODIUM (PORCINE) 1000 UNIT/ML DIALYSIS
1000.0000 [IU] | INTRAMUSCULAR | Status: DC | PRN
  Filled 2021-04-28: qty 3
  Filled 2021-04-28: qty 6

## 2021-04-28 MED ORDER — SODIUM CHLORIDE 0.9 % IV BOLUS
2000.0000 mL | Freq: Once | INTRAVENOUS | Status: AC
  Administered 2021-04-28: 2000 mL via INTRAVENOUS

## 2021-04-28 MED ORDER — NOREPINEPHRINE 4 MG/250ML-% IV SOLN
INTRAVENOUS | Status: AC
  Administered 2021-04-28: 10 ug/min via INTRAVENOUS
  Filled 2021-04-28: qty 250

## 2021-04-28 MED ORDER — SODIUM BICARBONATE 8.4 % IV SOLN
50.0000 meq | Freq: Once | INTRAVENOUS | Status: AC
  Administered 2021-04-28: 50 meq via INTRAVENOUS
  Filled 2021-04-28: qty 50

## 2021-04-28 MED ORDER — SODIUM CHLORIDE 0.9 % IV SOLN: INTRAVENOUS | Status: DC | PRN

## 2021-04-28 MED ORDER — SODIUM CHLORIDE 0.9 % IV SOLN
500.0000 [IU]/h | INTRAVENOUS | Status: DC
  Administered 2021-04-28: 500 [IU]/h via INTRAVENOUS_CENTRAL
  Filled 2021-04-28: qty 10000

## 2021-04-28 MED ORDER — SODIUM CHLORIDE 0.9 % IV SOLN: 250.0000 mL | INTRAVENOUS | Status: DC

## 2021-04-28 MED ORDER — SODIUM CHLORIDE 0.9 % IV SOLN
6.0000 mg | Freq: Once | INTRAVENOUS | Status: AC
  Administered 2021-04-28: 6 mg via INTRAVENOUS
  Filled 2021-04-28: qty 4

## 2021-04-28 MED ORDER — LIP MEDEX EX OINT
1.0000 "application " | TOPICAL_OINTMENT | CUTANEOUS | Status: DC | PRN
  Filled 2021-04-28: qty 7

## 2021-04-28 MED ORDER — NOREPINEPHRINE 4 MG/250ML-% IV SOLN
0.0000 ug/min | INTRAVENOUS | Status: DC
  Filled 2021-04-28: qty 250

## 2021-04-28 MED ORDER — VASOPRESSIN 20 UNITS/100 ML INFUSION FOR SHOCK
0.0000 [IU]/min | INTRAVENOUS | Status: DC
  Administered 2021-04-29: 0.03 [IU]/min via INTRAVENOUS
  Filled 2021-04-28: qty 100

## 2021-04-28 MED ORDER — SODIUM POLYSTYRENE SULFONATE 15 GM/60ML PO SUSP
30.0000 g | Freq: Once | ORAL | Status: AC
Start: 2021-04-28 — End: 2021-04-28
  Administered 2021-04-28: 30 g via ORAL
  Filled 2021-04-28: qty 120

## 2021-04-28 MED ORDER — AMLODIPINE BESYLATE 5 MG PO TABS
5.0000 mg | ORAL_TABLET | Freq: Every day | ORAL | Status: DC
  Administered 2021-04-28: 5 mg via ORAL
  Filled 2021-04-28: qty 1

## 2021-04-28 MED ORDER — FENTANYL CITRATE (PF) 100 MCG/2ML IJ SOLN: 50.0000 ug | INTRAMUSCULAR | Status: DC | PRN

## 2021-04-28 MED ORDER — ETOMIDATE 2 MG/ML IV SOLN
INTRAVENOUS | Status: DC | PRN
  Administered 2021-04-28: 16 mg via INTRAVENOUS

## 2021-04-28 MED ORDER — MIDAZOLAM HCL 2 MG/2ML IJ SOLN: 2.0000 mg | INTRAMUSCULAR | Status: DC | PRN

## 2021-04-28 MED ORDER — ALLOPURINOL 100 MG PO TABS
100.0000 mg | ORAL_TABLET | Freq: Two times a day (BID) | ORAL | Status: DC
  Administered 2021-04-28: 100 mg via ORAL
  Filled 2021-04-28: qty 1

## 2021-04-28 MED ORDER — INSULIN ASPART 100 UNIT/ML IV SOLN
5.0000 [IU] | Freq: Once | INTRAVENOUS | Status: AC
  Administered 2021-04-28: 5 [IU] via INTRAVENOUS

## 2021-04-28 MED ORDER — CALCIUM GLUCONATE-NACL 1-0.675 GM/50ML-% IV SOLN
1.0000 g | Freq: Once | INTRAVENOUS | Status: AC
  Administered 2021-04-28: 1000 mg via INTRAVENOUS
  Filled 2021-04-28: qty 50

## 2021-04-28 MED ORDER — PRISMASOL BGK 0/2.5 32-2.5 MEQ/L EC SOLN: Status: DC

## 2021-04-28 MED ORDER — SODIUM CHLORIDE 0.9% FLUSH: 10.0000 mL | Freq: Two times a day (BID) | INTRAVENOUS | Status: DC

## 2021-04-28 MED ORDER — PROPOFOL 1000 MG/100ML IV EMUL
5.0000 ug/kg/min | INTRAVENOUS | Status: DC
  Administered 2021-04-28: 5 ug/kg/min via INTRAVENOUS
  Filled 2021-04-28: qty 100

## 2021-04-28 MED ORDER — ORAL CARE MOUTH RINSE
15.0000 mL | Freq: Two times a day (BID) | OROMUCOSAL | Status: DC
  Administered 2021-04-28: 15 mL via OROMUCOSAL

## 2021-04-28 MED ORDER — NOREPINEPHRINE 4 MG/250ML-% IV SOLN
2.0000 ug/min | INTRAVENOUS | Status: DC
Start: 2021-04-28 — End: 2021-04-28

## 2021-04-28 MED ORDER — FENTANYL 2500MCG IN NS 250ML (10MCG/ML) PREMIX INFUSION
0.0000 ug/h | INTRAVENOUS | Status: DC
  Administered 2021-04-28: 25 ug/h via INTRAVENOUS
  Filled 2021-04-28: qty 250

## 2021-04-28 MED ORDER — SUCCINYLCHOLINE CHLORIDE 200 MG/10ML IV SOSY
PREFILLED_SYRINGE | INTRAVENOUS | Status: DC | PRN
Start: 2021-04-28 — End: 2021-04-28
  Administered 2021-04-28: 140 mg via INTRAVENOUS

## 2021-04-28 MED ORDER — SODIUM CHLORIDE 0.9 % FOR CRRT: INTRAVENOUS_CENTRAL | Status: DC | PRN

## 2021-04-28 MED ORDER — SODIUM CHLORIDE 0.9% FLUSH: 10.0000 mL | INTRAVENOUS | Status: DC | PRN

## 2021-04-28 MED ORDER — PHENYLEPHRINE 40 MCG/ML (10ML) SYRINGE FOR IV PUSH (FOR BLOOD PRESSURE SUPPORT)
PREFILLED_SYRINGE | INTRAVENOUS | Status: AC
  Filled 2021-04-28: qty 10

## 2021-04-28 NOTE — Transfer of Care (Signed)
Immediate Anesthesia Transfer of Care Note  Patient: Emma [name]  Procedure(s) Performed: AN AD HOC INTUBATION  Patient Location: PACU  Anesthesia Type:General  Level of Consciousness: Patient remains intubated per anesthesia plan  Airway & Oxygen Therapy: Patient placed on Ventilator (see vital sign flow sheet for setting)  Post-op Assessment: Report given to RN and Post -op Vital signs reviewed and unstable, Anesthesiologist notified. Attending Dr. Mat Carne patient on levophed  Post vital signs: unstable orders given by Dr in the box  Last Vitals:  Vitals Value Taken Time  BP    Temp 36.1 C 04/28/21 2105  Pulse    Resp 24 04/28/21 2105  SpO2    Vitals shown include unvalidated device data.  Last Pain:  Vitals:   04/28/21 1812  TempSrc:   PainSc: 4       Patients Stated Pain Goal: 0 (96/72/89 7915)  Complications: No notable events documented.

## 2021-04-28 NOTE — Procedures (Signed)
Central Venous Catheter Insertion Procedure Note  Emma [name] [facility]  [id number]  14-Aug-1965  Date:04/28/21  Time:10:13 AM   Provider Performing:Franchot Pollitt A Anajulia Leyendecker   Procedure: Insertion of Non-tunneled Central Venous Catheter(36556)with US guidance (85929)    Indication(s) Hemodialysis  Consent Risks of the procedure as well as the alternatives and risks of each were explained to the patient and/or caregiver.  Consent for the procedure was obtained and is signed in the bedside chart  Anesthesia Topical only with 1% lidocaine   Timeout Verified patient identification, verified procedure, site/side was marked, verified correct patient position, special equipment/implants available, medications/allergies/relevant history reviewed, required imaging and test results available.  Sterile Technique Maximal sterile technique including full sterile barrier drape, hand hygiene, sterile gown, sterile gloves, mask, hair covering, sterile ultrasound probe cover (if used).  Procedure Description Area of catheter insertion was cleaned with chlorhexidine and draped in sterile fashion.   With real-time ultrasound guidance a HD catheter was placed into the right internal jugular vein.  Nonpulsatile blood flow and easy flushing noted in all ports.  The catheter was sutured in place and sterile dressing applied.  Complications/Tolerance None; patient tolerated the procedure well. Chest X-ray is ordered to verify placement for internal jugular or subclavian cannulation.  Chest x-ray is not ordered for femoral cannulation.  EBL Minimal  Specimen(s) None

## 2021-04-28 NOTE — Progress Notes (Signed)
[facility] Progress Note Patient Name: Emma [name] DOB: 1966-04-08 MRN: [id number]   Date of Service  04/28/2021  HPI/Events of Note  Asked to video into room after pt had a vomiting episode with LOC and hypoxia.  Found pt lying in bed being bagged.  Pt then was intubated and NGT inserted.  High volume output noted from NGT.  Also, an abd xray obtained just prior to videoing into room noted pneumobilia vs portal gas.  Pt started on levophed due to a MAP in the 40's although it's now held as she's again hypertensive.  eICU Interventions    Stat CT a/p ordered.  Art line going in now.  Bmp, cbc, aPTT, INR, type and screen all being sent     Intervention Category Major Interventions: Shock - evaluation and management;Respiratory failure - evaluation and management;Airway management;Code management / supervision  Tilden Dome 04/28/2021, 8:48 PM

## 2021-04-28 NOTE — Consult Note (Signed)
Emma [name], MRN:  [id number], DOB:  November 01, 1965, LOS: 1 ADMISSION DATE:  05/07/21, CONSULTATION DATE:  04/28/21 REFERRING MD:  Dr Arlean Hopping, CHIEF COMPLAINT: Dialysis access  History of Present Illness:  55 year old lady with history of breast cancer, cervical cancer on current treatment admitted with abdominal fullness and distention Admitted with acute kidney injury, elevated potassium Medical treatment resistant at present  Pertinent  Medical History   Past Medical History:  Diagnosis Date   Breast cancer of upper-outer quadrant of left female breast (HCC) 07/13/2015   Heart murmur    "nothing to worry about"   Hypertension    Personal history of chemotherapy 2017   Personal history of radiation therapy 2017   Significant Hospital Events: Including procedures, antibiotic start and stop dates in addition to other pertinent events   04/28/2021 resistant hyperkalemia, acute kidney injury  Interim History / Subjective:  Abdominal discomfort, generally uncomfortable  Objective   Blood pressure (!) 162/85, pulse 98, temperature 98 F (36.7 C), temperature source Oral, resp. rate (!) 34, height 5\' 6"  (1.676 m), weight 98.9 kg, last menstrual period 09/08/2015, SpO2 100 %.        Intake/Output Summary (Last 24 hours) at 04/28/2021 1014 Last data filed at 04/28/2021 9629 Gross per 24 hour  Intake 2675.99 ml  Output 300 ml  Net 2375.99 ml   Filed Weights   05/07/21 1806 04/28/21 0500  Weight: 97 kg 98.9 kg    Examination: General: Middle-aged lady, chronically ill-appearing HENT: Dry oral mucosa Lungs: Clear breath sounds, tachypneic Cardiovascular: S1-S2 appreciated Abdomen: Distended, bowel sounds appreciated Extremities: No clubbing Neuro: Alert and oriented x3 GU:   Resolved Hospital Problem list     Assessment & Plan:  Acute kidney injury -Renal service involved -Plan for CRRT  Hyperkalemia -Received medical management -Potassium remains  high  Cervical cancer Tumor lysis syndrome  Patient requires dialysis access  Consent obtained I will go ahead and plan for HD catheter placement  Management per primary service  Virl Diamond, MD [facility] [facility] Pager: See Loretha Stapler

## 2021-04-28 NOTE — Procedures (Signed)
Arterial Catheter Insertion Procedure Note  Emma [name] [facility]  [id number]  1965/07/19  Date:04/28/21  Time:9:09 PM    Provider Performing: Rosann Auerbach    Procedure: Insertion of Arterial Line [phone]) without US guidance  Indication(s) Blood pressure monitoring and/or need for frequent ABGs  Consent Unable to obtain consent due to emergent nature of procedure.  Anesthesia None   Time Out Verified patient identification, verified procedure, site/side was marked, verified correct patient position, special equipment/implants available, medications/allergies/relevant history reviewed, required imaging and test results available.   Sterile Technique Maximal sterile technique including full sterile barrier drape, hand hygiene, sterile gown, sterile gloves, mask, hair covering, sterile ultrasound probe cover (if used).   Procedure Description Area of catheter insertion was cleaned with chlorhexidine and draped in sterile fashion. Without real-time ultrasound guidance an arterial catheter was placed into the right radial artery.  Appropriate arterial tracings confirmed on monitor.     Complications/Tolerance None; patient tolerated the procedure well.   EBL Minimal   Specimen(s) None

## 2021-04-28 NOTE — Anesthesia Procedure Notes (Signed)
Procedure Name: Intubation Date/Time: 04/28/2021 8:20 PM Performed by: Lissa Morales, CRNA Pre-anesthesia Checklist: Patient identified, Emergency Drugs available, Suction available and Patient being monitored Patient Re-evaluated:Patient Re-evaluated prior to induction Oxygen Delivery Method: Ambu bag Preoxygenation: Pre-oxygenation with 100% oxygen Induction Type: IV induction and Rapid sequence Ventilation: Mask ventilation without difficulty Laryngoscope Size: Glidescope and 3 Grade View: Grade II Tube type: Oral Tube size: 7.5 mm Number of attempts: 1 Airway Equipment and Method: Stylet and Oral airway Placement Confirmation: ETT inserted through vocal cords under direct vision, positive ETCO2 and breath sounds checked- equal and bilateral Secured at: 22 cm Tube secured with: Tape Dental Injury: Teeth and Oropharynx as per pre-operative assessment  Comments: Called to ICU and code in progress. Pt collapsed  with nurse present and aspirated after arriving from ER.Diagnosed with bowel perforation and abdomen very distended and tight.Patient clenching . Medicines given with immediate rapid sequence intubation, gastric secretions in back of throat . ETT 7.5 passed easily ,atraumatically through cords and ETT suctioned out light  yellow secretions from ETT. Bilateral breath sounds equal and rhonchus after suctioning O2 sat 96%, HR86,  BP 88/58. NG placed down left nare with gastric secretions returned. Dr. Sabra Heck called prior to intubation and after as discussed with anesthesiologist.Awaiting surgery consult.

## 2021-04-28 NOTE — Progress Notes (Signed)
Emma [name] from the lab called with a critical K 6.9.

## 2021-04-28 NOTE — Progress Notes (Signed)
eLink Physician-Brief Progress Note Patient Name: Emma [name] DOB: 1966-04-26 MRN: [id number]   Date of Service  04/28/2021  HPI/Events of Note  - husband at bedside - I explained to him that she is on life support - that her small intestine ruptured - that her small intestine was not getting blood flow - that her cancer was the cause of this - that she is dying - that there isn't any reasonable way to fix this  eICU Interventions  - RN aware of conversation I had with husband over phone - will touch base with RN and husband again during shift - pt on high dose levophed - will touch base with husband again before adding another agent     Intervention Category Minor Interventions: Communication with other healthcare providers and/or family  Tilden Dome 04/28/2021, 10:56 PM

## 2021-04-28 NOTE — Progress Notes (Addendum)
Pharmacy Note regarding consult to evaluate for Rasburicase:  -In stock at [place] of established Tumor Lysis Syndrome includes rasburicase 6 mg IV single dose with repeat once daily doses if needed depending on plasma uric acid levels.  -Dosing: Altered Kidney Function (Adults): There are no dosage adjustments provided in the manufactureer's labeling; however, baseline CrCl did not impact rasburicase pharmacokinetics.  -Contraindications: History of anaphylaxis or severe hypersensitivity to rasburicase or any component of the formulation; history of hemolytic reaction or methemoglobinemia associated with rasburicase; glucose-6-phosphatase dehydrogenase (G6PD) deficiency    Royetta Asal, PharmD, BCPS Clinical Pharmacist [place] Please utilize Amion for appropriate phone number to reach the unit pharmacist ([place]) 04/28/2021 11:27 AM

## 2021-04-28 NOTE — Progress Notes (Signed)
eLink Physician-Brief Progress Note Patient Name: Emma [name] DOB: [birth year] MRN: [id number]   Date of Service  04/28/2021  HPI/Events of Note  Severely acidemic on abg  eICU Interventions  Starting bicarb infusion     Intervention Category Major Interventions: Acid-Base disturbance - evaluation and management  Tilden Dome 04/28/2021, 9:39 PM

## 2021-04-28 NOTE — Consult Note (Addendum)
Renal Service Consult Note [name]  Emma [name] [facility] 04/28/2021 Emma Krabbe, MD Requesting Physician: Dr. Alvino [name]   Reason for Consult: Renal failure HPI: The patient is a 55 y.o. year-old w/ hx of HTN, past hx breast cancer and recent outpt diagnosis of cervical cancer who presented to cancer center 12/16 yesterday w/ multiple c/o's including increasing abd fullness, poor appetite for food/ liquids, insomnia. Labs were done showing renal failure and pt was sent to ED.  In ED labs confirmed renal failure w/ creat 3.5, K 6.9.  Pt rec'd acute temporizing measures for hyperkalemia and IVF"s were started. K+ improved to mid 5's but is back up to 6.9 this morning. Creat 3.7 today. Asked to see for renal failure.   Pt seen in SDU room.  She lives in [place] (other side or [address] [place]) but is temporarily living in [place] (used to live here and kept her doctors here when she moved). She lives w/ her husband.  No tob/ etoh. Only main chronic diagnosis is HTN, taking norvasc. Also hx breast cancer. Denies hx of kidney disease/ failure.    ROS - denies CP, no joint pain, no HA, no blurry vision, no rash, no diarrhea, no nausea/ vomiting, no dysuria, no difficulty voiding   Past Medical History  Past Medical History:  Diagnosis Date   Breast cancer of upper-outer quadrant of left female breast (HCC) 07/13/2015   Heart murmur    "nothing to worry about"   Hypertension    Personal history of chemotherapy 2017   Personal history of radiation therapy 2017   Past Surgical History  Past Surgical History:  Procedure Laterality Date   BREAST BIOPSY Left 07/10/2015   BREAST LUMPECTOMY Left 12/01/2015   LEFT BREAST SEED GUIDED LUMPECTOMY WITH LEFT AXILLARY NODE DISSECTION  (Left)   CESAREAN SECTION  1998   COLONOSCOPY W/ POLYPECTOMY     DILATION AND EVACUATION  05/01/2011   Procedure: DILATATION AND EVACUATION;  Surgeon: Emma [name];  Location: [facility];  Service: Gynecology;   Laterality: N/A;   FOOT SURGERY Bilateral 1993   bunionectomy   PORT-A-CATH REMOVAL  12/01/2015   PORT-A-CATH REMOVAL Right 12/01/2015   Procedure: REMOVAL PORT-A-CATH;  Surgeon: Emma Kin, MD;  Location: [facility] OR;  Service: General;  Laterality: Right;   PORTACATH PLACEMENT Right 07/31/2015   Procedure: INSERTION PORT-A-CATH;  Surgeon: Emma Kin, MD;  Location: [facility];  Service: General;  Laterality: Right;   RADIOACTIVE SEED GUIDED PARTIAL MASTECTOMY/AXILLARY SENTINEL NODE BIOPSY/AXILLARY NODE DISSECTION Left 12/01/2015   Procedure: LEFT BREAST SEED GUIDED LUMPECTOMY WITH LEFT AXILLARY NODE DISSECTION;  Surgeon: Emma Kin, MD;  Location: [facility] OR;  Service: General;  Laterality: Left;   Family History  Family History  Problem Relation Age of Onset   Colon cancer Mother    Prostate cancer Brother    Ovarian cancer Other    Breast cancer Other    Prostate cancer Other    Endometrial cancer Other    Pancreatic cancer Other    Social History  reports that she has never smoked. She has never used smokeless tobacco. She reports that she does not drink alcohol and does not use drugs. Allergies No Known Allergies Home medications Prior to Admission medications   Medication Sig Start Date End Date Taking? Authorizing Provider  acetaminophen (TYLENOL) 500 MG tablet Take 500 mg by mouth every 6 (six) hours as needed for moderate pain.   Yes [provider]  amLODipine (NORVASC) 10 MG  tablet Take 10 mg by mouth daily. 08/21/15  Yes [provider]  Oxycodone HCl 10 MG TABS Take 1 tablet (10 mg total) by mouth every 4 (four) hours as needed for severe pain. 2021-05-23  Yes Gorsuch, Ni, MD  gabapentin (NEURONTIN) 300 MG capsule Take 1 capsule (300 mg total) by mouth at bedtime. Patient not taking: Reported on 04/26/2021 09/24/17   Magrinat, Valentino Hue, MD  lidocaine-prilocaine (EMLA) cream Apply to affected area once as directed Patient not taking: Reported on  2021-05-23 05/23/21   Artis Delay, MD  ondansetron (ZOFRAN) 8 MG tablet Take 1 tablet (8 mg total) by mouth every 8 (eight) hours as needed. Patient not taking: Reported on 2021-05-23 05-23-2021   Artis Delay, MD  prochlorperazine (COMPAZINE) 10 MG tablet Take 1 tablet (10 mg total) by mouth every 6 (six) hours as needed (Nausea or vomiting). Patient not taking: Reported on 05/23/21 23-May-2021   Artis Delay, MD     Vitals:   04/28/21 0700 04/28/21 0800 04/28/21 0834 04/28/21 0900  BP: (!) 141/76 (!) 162/82  (!) 162/85  Pulse: 88 98    Resp: (!) 22 (!) 29  (!) 34  Temp:   98 F (36.7 C)   TempSrc:   Oral   SpO2: 95% 100%    Weight:      Height:       Exam Gen alert, no distress, speech is a bit sluggish No rash, cyanosis or gangrene Sclera anicteric, throat clear  No jvd or bruits Chest clear bilat to bases, no rales/ wheezing RRR no MRG Abd firm and distended, no fluid wave or hsm noted, nontender GU defer MS no joint effusions or deformity Ext LE's are w/o edema, no wounds or ulcers Neuro is alert, Ox 3 , nf, no asterixis     Home meds include - norvasc 10, oxy IR prn, neurontin , prns/ vits      Date   Creat  eGFR    2017- 2021  0.8- 1.1 > 60    Oct 2022  1.06  > 60    Apr 19 2021  1.40      May 23, 2021  3.52  15             CT abd 04/19/21 - IMPRESSION: Markedly enlarged cervix and uterus with markedly expanded endometrium... suspicious for neoplasm and could be obstructive cervical carcinoma or primary endometrial lesion... Moderate volume abdominopelvic ascites associated with innumerable mesenteric and peritoneal nodules, consistent with metastatic disease... heterogeneous lesion posterior right hepatic lobe, highly suspicious for metastatic disease.... Retroperitoneal and pelvic sidewall lymphadenopathy c/w metastatic disease       Surg path 04/16/21 - ... the morphology and immunophenotype...consistent with poorly differentiated carcinoma with neuroendocrine  features  and the differential includes primary cervical carcinoma with  small cell features as well as secondary involvement by poorly differentiated carcinoma   CXR 12/16 - no active disease    UA 12/16 - cloudy, large LE, 30 prot, >50 rbc/ wbc, few bact   UNa <10,  UCr 253      Renal US 12/16 - 10.4/ 12.0 cm kidneys, + R hydro, no hydro on L      Na 134  K+ 6.9  CO2 17  BUN 88  Cr 3.72  Ca 8.5 alb 2.5  Uric acid >21      Ast 99/ alt 25  tbili 0.9  eGFR 14  LA > 9.0  CPK 204  WBC 14K  Hb  8.7  glu 124  Assessment/ Plan: AKI - normal creat 1.06 at baseline in Oct 2022. Here now w/ creat 3.5 in setting of recently diagnosed cancer, likely cervical cancer. Has not started chemoRx yet. K+ high and uric acid > 21, suggesting tumor lysis syndrome. Also looks slightly dry on exam and urine lytes c/w prerenal. Also there is R hydro prob due to the large pelvic mass/ tumor compressing the R ureter.  For TLE will consult pharm for rasburicase dosing. Hyperkalemia is refractory will need CRRT today. Place foley. Continue IVF"s at 125 cc/hr. Will rebolus 1-2 L NS as well. Is making urine. Have d/w patient who agrees to proceed. Will follow.  Hyperkalemia - renal diet and CRRT Cancer / pelvic mass - biopsied on 12/5, per ONC H/o HTN - on norvasc, hold for now H/o prior breast cancer      Emma Moselle  MD 04/28/2021, 9:47 AM  Recent Labs  Lab 04/28/2021 1328 04/28/21 0253  WBC 17.5* 14.7*  HGB 9.5* 8.7*   Recent Labs  Lab 04/19/2021 1328 05/05/2021 1830 04/28/21 0253 04/28/21 0746  K 6.9* 5.9* 6.9* 7.0*  BUN 82*  --  88*  --   CREATININE 3.52*  --  3.72*  --   CALCIUM 9.9  --  8.5*  --   PHOS  --  6.9*  --   --

## 2021-04-28 NOTE — Progress Notes (Addendum)
PROGRESS NOTE    KACEY KAMADA  ZOX:[id number] DOB: 1966-04-07 DOA: 04/16/2021 PCP: [facility]     Brief Narrative:  Emma [name] is a 55 y.o. female with medical history significant of remote history of breast cancer, cervical cancer under current treatment, hypertension who presented to the cancer center earlier today.  She complains of feeling poorly overall, with abdominal fullness and distention, has not been able to sleep well, has not been able to eat much or drink fluids at home.  She denies any recent fevers, chest pain or shortness of breath, denies any cough, nausea, vomiting, dysuria.  At the cancer center, they completed lab work and immediately sent her to the emergency department due to abnormal lab work. Labs revealed creatinine 3.52, potassium 6.9 and patient was sent to the emergency department.  Patient was given albuterol, insulin/dextrose, IV calcium gluconate, oral Lokelma, IV fluid with bicarb.  New events last 24 hours / Subjective: Patient states that she is feeling a little bit better, has not eaten much since admission, UOP recorded last night. Purewick is in place and container empty.   Assessment & Plan:   Principal Problem:   Hyperkalemia Active Problems:   Cervical cancer (HCC)   Cancer associated pain   Acute renal failure (ARF) (HCC)   Severe hyperkalemia and acute kidney injury -IV calcium gluconate, albuterol, insulin/dextrose, IV fluid with bicarb, Lokelma with repeat potassium better overnight but this morning increased again. Ordered another calcium gluconate and Kayex. Repeat K ordered q4h -Renal US showed mild right hydronephrosis and ascites  -Nephrology consulted -Strict I/Os   Tumor lysis syndrome  Cervical cancer  -Followed by Dr. Bertis Ruddy, has port placement and chemotherapy arranged for next week -Uric acid > 21, phos 6.9, corrected Ca 9.3, K 6.9, Cr 3.72  -Spoke with Dr. Nelly Rout,  oncology covering weekend phone call. She agrees with TLS, recommended Nephrology consult, continued IVF and trending lab work, and initiating allopurinol. Discussed with pharmacy, due to pt CrCl, rec 200mg  max dose per day    Leukocytosis Lactic acidosis  -No acute sign of acute infection, continue to monitor, WBC improved without antibiotics  -Could be secondary to above processes  -Trend lactic acid  -UA showed large leukocytes but neg nitrites, few bacteria, WBC >50, RBC >50 -Blood culture pending    Hypertension -Norvasc    Chronic pain -Continue oxycodone   DVT prophylaxis:  heparin injection 5,000 Units Start: 05/04/2021 2200 SCDs Start: 05/04/2021 1808  Code Status: Full Family Communication: None at bedside, plan to discuss with husband when he comes in later today  Disposition Plan:  Status is: Inpatient  Remains inpatient appropriate because: critically ill       Consultants:  Oncology via phone Nephrology  Procedures:  None   Antimicrobials:  Anti-infectives (From admission, onward)    None        Objective: Vitals:   04/28/21 0300 04/28/21 0400 04/28/21 0500 04/28/21 0600  BP: (!) 140/106 130/70 (!) 156/70 (!) 163/83  Pulse: 89 85 92   Resp: (!) 24 (!) 22 (!) 22 (!) 27  Temp:  97.9 F (36.6 C)    TempSrc:  Oral    SpO2: 96% 96% 98%   Weight:   98.9 kg   Height:        Intake/Output Summary (Last 24 hours) at 04/28/2021 0818 Last data filed at 04/28/2021 0724 Gross per 24 hour  Intake 2675.99 ml  Output 300 ml  Net  2375.99 ml   Filed Weights   04/13/2021 1806 04/28/21 0500  Weight: 97 kg 98.9 kg    Examination:  General exam: Appears calm and comfortable  Respiratory system: Respiratory effort normal. No respiratory distress.  Cardiovascular system: RRR. No pedal edema. Gastrointestinal system: Abdomen is distended Central nervous system: Alert and oriented Extremities: Symmetric in appearance  Skin: No rashes, lesions or ulcers  on exposed skin  Psychiatry: Judgement and insight appear normal. Mood & affect appropriate.   Data Reviewed: I have personally reviewed following labs and imaging studies  CBC: Recent Labs  Lab 04/22/2021 1328 04/28/21 0253  WBC 17.5* 14.7*  NEUTROABS 12.5*  --   HGB 9.5* 8.7*  HCT 29.1* 27.2*  MCV 79.1* 81.0  PLT 229 203   Basic Metabolic Panel: Recent Labs  Lab 05/11/2021 1328 05/12/2021 1830 04/28/21 0253  NA 136  --  134*  K 6.9* 5.9* 6.9*  CL 97*  --  97*  CO2 13*  --  17*  GLUCOSE 117*  --  124*  BUN 82*  --  88*  CREATININE 3.52*  --  3.72*  CALCIUM 9.9  --  8.5*  MG 2.6*  --   --   PHOS  --  6.9*  --    GFR: Estimated Creatinine Clearance: 20 mL/min (A) (by C-G formula based on SCr of 3.72 mg/dL (H)). Liver Function Tests: Recent Labs  Lab 05/11/2021 1328 04/28/21 0253  AST 121* 99*  ALT 24 25  ALKPHOS 132* 110  BILITOT 0.7 0.9  PROT 7.7 7.3  ALBUMIN 2.4* 2.5*   No results for input(s): LIPASE, AMYLASE in the last 168 hours. No results for input(s): AMMONIA in the last 168 hours. Coagulation Profile: No results for input(s): INR, PROTIME in the last 168 hours. Cardiac Enzymes: Recent Labs  Lab 04/23/2021 1830  CKTOTAL 204   BNP (last 3 results) No results for input(s): PROBNP in the last 8760 hours. HbA1C: No results for input(s): HGBA1C in the last 72 hours. CBG: Recent Labs  Lab 04/22/2021 1610 04/14/2021 1715  GLUCAP 114* 122*   Lipid Profile: No results for input(s): CHOL, HDL, LDLCALC, TRIG, CHOLHDL, LDLDIRECT in the last 72 hours. Thyroid Function Tests: No results for input(s): TSH, T4TOTAL, FREET4, T3FREE, THYROIDAB in the last 72 hours. Anemia Panel: No results for input(s): VITAMINB12, FOLATE, FERRITIN, TIBC, IRON, RETICCTPCT in the last 72 hours. Sepsis Labs: Recent Labs  Lab 05/09/2021 1611 04/20/2021 1830  LATICACIDVEN >9.0* >9.0*    Recent Results (from the past 240 hour(s))  Resp Panel by RT-PCR (Flu A&B, Covid) Nasopharyngeal  Swab     Status: None   Collection Time: 04/18/2021  4:11 PM   Specimen: Nasopharyngeal Swab; Nasopharyngeal(NP) swabs in vial transport medium  Result Value Ref Range Status   SARS Coronavirus 2 by RT PCR NEGATIVE NEGATIVE Final    Comment: (NOTE) SARS-CoV-2 target nucleic acids are NOT DETECTED.  The SARS-CoV-2 RNA is generally detectable in upper respiratory specimens during the acute phase of infection. The lowest concentration of SARS-CoV-2 viral copies this assay can detect is 138 copies/mL. A negative result does not preclude SARS-Cov-2 infection and should not be used as the sole basis for treatment or other patient management decisions. A negative result may occur with  improper specimen collection/handling, submission of specimen other than nasopharyngeal swab, presence of viral mutation(s) within the areas targeted by this assay, and inadequate number of viral copies(<138 copies/mL). A negative result must be combined with clinical observations,  patient history, and epidemiological information. The expected result is Negative.  Fact Sheet for Patients:  BloggerCourse.com  Fact Sheet for Healthcare Providers:  SeriousBroker.it  This test is no t yet approved or cleared by the [place] FDA and  has been authorized for detection and/or diagnosis of SARS-CoV-2 by FDA under an Emergency Use Authorization (EUA). This EUA will remain  in effect (meaning this test can be used) for the duration of the COVID-19 declaration under Section 564(b)(1) of the Act, 21 U.S.C.section 360bbb-3(b)(1), unless the authorization is terminated  or revoked sooner.       Influenza A by PCR NEGATIVE NEGATIVE Final   Influenza B by PCR NEGATIVE NEGATIVE Final    Comment: (NOTE) The Xpert Xpress SARS-CoV-2/FLU/RSV plus assay is intended as an aid in the diagnosis of influenza from Nasopharyngeal swab specimens and should not be used as a sole  basis for treatment. Nasal washings and aspirates are unacceptable for Xpert Xpress SARS-CoV-2/FLU/RSV testing.  Fact Sheet for Patients: BloggerCourse.com  Fact Sheet for Healthcare Providers: SeriousBroker.it  This test is not yet approved or cleared by the [place] FDA and has been authorized for detection and/or diagnosis of SARS-CoV-2 by FDA under an Emergency Use Authorization (EUA). This EUA will remain in effect (meaning this test can be used) for the duration of the COVID-19 declaration under Section 564(b)(1) of the Act, 21 U.S.C. section 360bbb-3(b)(1), unless the authorization is terminated or revoked.  Performed at [facility], [address], [place], [place] [address]   MRSA Next Gen by PCR, Nasal     Status: None   Collection Time: 04/23/2021  6:02 PM   Specimen: Nasal Mucosa; Nasal Swab  Result Value Ref Range Status   MRSA by PCR Next Gen NOT DETECTED NOT DETECTED Final    Comment: (NOTE) The GeneXpert MRSA Assay (FDA approved for NASAL specimens only), is one component of a comprehensive MRSA colonization surveillance program. It is not intended to diagnose MRSA infection nor to guide or monitor treatment for MRSA infections. Test performance is not FDA approved in patients less than 44 years old. Performed at [facility], [address] [place], [place] [address]       Radiology Studies: US RENAL  Result Date: 04/16/2021 CLINICAL DATA:  Acute renal insufficiency. EXAM: RENAL / URINARY TRACT ULTRASOUND COMPLETE COMPARISON:  CT scan April 19, 2021 FINDINGS: Right Kidney: Renal measurements: 10.4 x 4.7 x 5.3 cm = volume: 135.7 mL. Mild hydronephrosis. Left Kidney: Renal measurements: 12.0 x 5.2 x 5.2 cm = volume: 168 mL. Echogenicity within normal limits. No mass or hydronephrosis visualized. Bladder: Limited evaluation.  No significant distension. Other: Ascites.  IMPRESSION: 1. Evaluation of the left kidney is limited. There is mild right hydronephrosis. No definite abnormalities on the left. 2. The bladder is poorly assessed due to lack of distention. 3. Ascites. Electronically Signed   By: Gerome Sam III M.D.   On: 04/20/2021 17:25   DG Chest Portable 1 View  Result Date: 04/17/2021 CLINICAL DATA:  Kidney failure.  Denies chest complaints. EXAM: PORTABLE CHEST 1 VIEW COMPARISON:  CT chest dated April 19, 2021. Chest x-ray dated Sep 24, 2016. FINDINGS: The heart size and mediastinal contours are within normal limits. Both lungs are clear. Mild elevation of the right hemidiaphragm. The visualized skeletal structures are unremarkable. IMPRESSION: No active disease. Electronically Signed   By: Obie Dredge M.D.   On: 04/23/2021 16:02      Scheduled Meds:  allopurinol  100 mg Oral BID   Chlorhexidine Gluconate Cloth  6 each Topical Daily   heparin  5,000 Units Subcutaneous Q8H   mouth rinse  15 mL Mouth Rinse BID   sodium chloride flush  3 mL Intravenous Q12H   sodium zirconium cyclosilicate  10 g Oral TID   Continuous Infusions:  sodium chloride 125 mL/hr at 04/28/21 0724     LOS: 1 day      Time spent: 35 minutes   Noralee Stain, DO [facility] 04/28/2021, 8:18 AM   Available via Epic secure chat 7am-7pm After these hours, please refer to coverage provider listed on [email or web address]

## 2021-04-28 NOTE — Progress Notes (Addendum)
Messaged [facility] NP, with critical value.

## 2021-04-29 DIAGNOSIS — N179 Acute kidney failure, unspecified: Principal | ICD-10-CM

## 2021-04-29 LAB — POTASSIUM: Potassium: 6.2 mmol/L — ABNORMAL HIGH (ref 3.5–5.1)

## 2021-04-29 MED ORDER — NOREPINEPHRINE 16 MG/250ML-% IV SOLN
0.0000 ug/min | INTRAVENOUS | Status: DC
  Administered 2021-04-29: 02:00:00 50 ug/min via INTRAVENOUS
  Filled 2021-04-29: qty 250

## 2021-04-30 ENCOUNTER — Ambulatory Visit (HOSPITAL_COMMUNITY): Admission: RE | Admit: 2021-04-30 | Payer: BC Managed Care – PPO | Source: Ambulatory Visit

## 2021-05-01 ENCOUNTER — Ambulatory Visit: Payer: BC Managed Care – PPO

## 2021-05-02 ENCOUNTER — Ambulatory Visit: Payer: BC Managed Care – PPO

## 2021-05-03 ENCOUNTER — Ambulatory Visit: Payer: BC Managed Care – PPO

## 2021-05-03 LAB — CULTURE, BLOOD (ROUTINE X 2)
Culture: NO GROWTH
Culture: NO GROWTH
Special Requests: ADEQUATE
Special Requests: ADEQUATE

## 2021-05-12 ENCOUNTER — Other Ambulatory Visit: Payer: Self-pay | Admitting: Oncology

## 2021-05-13 NOTE — Progress Notes (Signed)
[facility] Progress Note Patient Name: Emma [name] DOB: Nov 17, 1965 MRN: [id number]   Date of Service  05-10-21  HPI/Events of Note  Pt in refractory shock  eICU Interventions  Pt now DNR.  Will keep pressors on board until more family arrives.     Intervention Category Minor Interventions: Communication with other healthcare providers and/or family  Tilden Dome 05-10-2021, 12:37 AM

## 2021-05-13 NOTE — Death Summary Note (Addendum)
DEATH SUMMARY   Patient Details  Name: Emma [name] MRN: [id number] DOB: 04-30-1966  Admission/Discharge Information   Admit Date:  2021-05-05  Date of Death: Date of Death: 07-May-2021  Time of Death: Time of Death: 0430  Length of Stay: 2  Referring Physician: [facility]   Reason(s) for Hospitalization  Kidney failure and hyperkalemia  Diagnoses  Preliminary cause of death: Shock  Secondary Diagnoses (including complications and co-morbidities):  Principal Problem:   Hyperkalemia Active Problems:   Cervical cancer (HCC)   Cancer associated pain   Acute renal failure (ARF) (HCC)   Tumor lysis syndrome   Brief Hospital Course (including significant findings, care, treatment, and services provided and events leading to death)  Emma [name] is a 56 y.o. female with medical history significant of remote history of breast cancer, cervical cancer under current treatment, hypertension who presented to the cancer center earlier today.  She complains of feeling poorly overall, with abdominal fullness and distention, has not been able to sleep well, has not been able to eat much or drink fluids at home.  She denies any recent fevers, chest pain or shortness of breath, denies any cough, nausea, vomiting, dysuria.  At the cancer center, they completed lab work and immediately sent her to the emergency department due to abnormal lab work. Labs revealed creatinine 3.52, potassium 6.9 and patient was sent to the emergency department.  Patient was given albuterol, insulin/dextrose, IV calcium gluconate, oral Lokelma, IV fluid with bicarb. Nephrology as well as PCCM were consulted, oncology consulted over the phone. Patient had HD catheter placed and underwent CRRT. Evening of 12/17, she complained of nausea and worsening abdominal pain, had episode of vomiting and subsequent aspiration, and became unresponsive. Code blue was called and patient was  intubated. Imaging revealed ischemic bowel. She developed shock and was remained on high dose pressors. She was transitioned to DNR status and pronounced deceased 05-07-2021 at 4:30am.   Pertinent Labs and Studies  Significant Diagnostic Studies CT ABDOMEN PELVIS WO CONTRAST  Result Date: 04/28/2021 CLINICAL DATA:  Peritonitis.  Perforation suspected. EXAM: CT ABDOMEN AND PELVIS WITHOUT CONTRAST TECHNIQUE: Multidetector CT imaging of the abdomen and pelvis was performed following the standard protocol without IV contrast. COMPARISON:  CT chest abdomen and pelvis 04/19/2021. FINDINGS: Lower chest: There is new airspace consolidation with air bronchograms in the bilateral lower lobes. Hepatobiliary: Rounded heterogeneous lesion in the posterior right liver measures 2.8 cm and appears similar to the prior study. There is new portal venous gas throughout the left lobe of the liver. The liver is otherwise grossly within normal limits. The gallbladder and bile ducts are grossly within normal limits. Pancreas: Unremarkable. No pancreatic ductal dilatation or surrounding inflammatory changes. Spleen: Normal in size without focal abnormality. Adrenals/Urinary Tract: The bladder is decompressed by Foley catheter not well evaluated. The adrenal glands are within normal limits. Kidneys are within normal limits. Stomach/Bowel: Small bowel loops are diffusely dilated with air-fluid levels measuring up to 3.5 cm. There is pneumatosis throughout small bowel loops in the central abdomen with marked portal venous gas seen in the mesentery and superior mesenteric vein. The tip of an enteric tube is in the distal esophagus. There is moderate air-fluid level in the stomach. Colon is unremarkable. Appendix not visualized. Vascular/Lymphatic: Extensive portal venous gas seen in the mesentery, superior mesenteric vein and liver. Aorta and IVC grossly normal in size. Retroperitoneal lymphadenopathy is similar to the prior examination.  Largest lymph node measures  2.6 x 1.9 cm in the left retroperitoneum, unchanged. Bilateral iliac chain lymphadenopathy is also similar to the prior study allowing for lack of contrast. Reproductive: The uterus is markedly enlarged and heterogeneous containing multiple masses. Endometrium is markedly heterogeneous and thickened measuring up to 7.6 cm in thickness. Appearance is similar to the prior study. Ovaries are not well delineated. Other: Small volume ascites is similar to the prior study. Diffuse peritoneal metastatic and omental implants are again seen throughout the abdomen and pelvis, grossly unchanged. There is small umbilical hernia containing metastatic deposit, unchanged. Musculoskeletal: There is deep fascial edema in the bilateral lower extremities. No focal fluid collections are identified. No acute fractures are seen. There is questionable small lytic lesion in the anterior vertebral body at L3. IMPRESSION: 1. Diffusely dilated small bowel with pneumatosis and marked portal venous gas worrisome for small bowel ischemia. 2. Deep fascial plane edema in the lower extremities, indeterminate. 3. Enteric tube tip in the distal esophagus. Recommend repositioning. 4. Stable abnormal appearance of the uterus and endometrium highly suspicious for carcinoma. 5. Stable small volume ascites with diffuse metastatic peritoneal implants. 6. Stable 2.8 cm hepatic lesion worrisome for metastatic disease. 7. New bilateral lower lobe airspace consolidation compatible with pneumonia. 8. Questionable small lytic lesion in the L3 vertebral body suspicious for metastatic disease. 9. These results were called by telephone at the time of interpretation on 04/28/2021 at 10:04 pm to provider [facility] , who verbally acknowledged these results. Electronically Signed   By: Darliss Cheney M.D.   On: 04/28/2021 22:05   DG Abd 1 View  Result Date: 04/28/2021 CLINICAL DATA:  Breast cancer, cervical cancer, abdominal  fullness and distension EXAM: ABDOMEN - 1 VIEW COMPARISON:  CT 04/19/2021 FINDINGS: There is central crowding of aerated bowel loops of large and small bowel suggesting the presence of underlying ascites. Additionally, multiple prominent gas-filled loops of large and small bowel are present suggesting an underlying adynamic ileus. No gross free intraperitoneal gas. There are, however, branching linear collections of gas within the epigastric region which may represent pneumobilia or portal venous gas within the non dependent left hepatic lobe. IMPRESSION: Pneumobilia versus portal venous gas within the left hepatic lobe. This may relate to underlying bowel perforation and/or infarction. Dedicated CT examination of the abdomen and pelvis is recommended for further evaluation. Electronically Signed   By: Helyn Numbers M.D.   On: 04/28/2021 20:06   US RENAL  Result Date: 05/04/21 CLINICAL DATA:  Acute renal insufficiency. EXAM: RENAL / URINARY TRACT ULTRASOUND COMPLETE COMPARISON:  CT scan April 19, 2021 FINDINGS: Right Kidney: Renal measurements: 10.4 x 4.7 x 5.3 cm = volume: 135.7 mL. Mild hydronephrosis. Left Kidney: Renal measurements: 12.0 x 5.2 x 5.2 cm = volume: 168 mL. Echogenicity within normal limits. No mass or hydronephrosis visualized. Bladder: Limited evaluation.  No significant distension. Other: Ascites. IMPRESSION: 1. Evaluation of the left kidney is limited. There is mild right hydronephrosis. No definite abnormalities on the left. 2. The bladder is poorly assessed due to lack of distention. 3. Ascites. Electronically Signed   By: Gerome Sam III M.D.   On: 2021-05-04 17:25   CT CHEST ABDOMEN PELVIS W CONTRAST  Result Date: 04/20/2021 CLINICAL DATA:  Pelvic mass with ascites. Cervical mass on exam. History of breast cancer. Abdominal distension. EXAM: CT CHEST, ABDOMEN, AND PELVIS WITH CONTRAST TECHNIQUE: Multidetector CT imaging of the chest, abdomen and pelvis was performed  following the standard protocol during bolus administration of intravenous contrast. CONTRAST:  80mL OMNIPAQUE IOHEXOL 300 MG/ML  SOLN COMPARISON:  07/27/2015 FINDINGS: CT CHEST FINDINGS Cardiovascular: The heart size is normal. No substantial pericardial effusion. Mild atherosclerotic calcification is noted in the wall of the thoracic aorta. Mediastinum/Nodes: No mediastinal lymphadenopathy. 1.5 x 3.1 cm soft tissue nodule identified just deep to the anterior right first and second ribs, likely in the internal mammary lymph node. There is no hilar lymphadenopathy. The esophagus has normal imaging features. There is no axillary lymphadenopathy. Surgical clips noted left axilla. Lungs/Pleura: No suspicious pulmonary nodule or mass. No focal airspace consolidation. No pleural effusion. Musculoskeletal: No worrisome lytic or sclerotic osseous abnormality. CT ABDOMEN PELVIS FINDINGS Hepatobiliary: 2.7 cm heterogeneous lesion identified posterior right hepatic lobe (image 52/series 2). There is no evidence for gallstones, gallbladder wall thickening, or pericholecystic fluid. No intrahepatic or extrahepatic biliary dilation. Pancreas: No focal mass lesion. No dilatation of the main duct. No intraparenchymal cyst. No peripancreatic edema. Spleen: No splenomegaly. No focal mass lesion. Adrenals/Urinary Tract: No adrenal nodule or mass. Mild fullness noted intrarenal collecting system of both kidneys without substantial hydroureter. Bladder is nondistended. Stomach/Bowel: all thickening in the antral region of the stomach likely related to peristalsis. Duodenum is normally positioned as is the ligament of Treitz. No small bowel wall thickening. No small bowel dilatation. The terminal ileum is normal. No gross colonic mass. No colonic wall thickening. Vascular/Lymphatic: No abdominal aortic aneurysm. Retroperitoneal lymphadenopathy noted with index left para-aortic node measuring 2.1 cm short axis on image 70/series 2. Index  1.8 cm short axis left external iliac node seen along the pelvic sidewall (image 101/2) with contralateral pelvic sidewall lymph nodes measuring up to 1.7 cm short axis (also 101/2). Reproductive: Uterus is markedly enlarged measuring 26.3 x 12.8 x 18.9 cm. Endometrium is markedly expanded up to 6.1 cm thickness on sagittal imaging. Cervix is not well evaluated on CT, but is markedly enlarged with obscuration of the cervical canal. Neither ovary discretely visible. Other: Moderate volume abdominopelvic ascites associated with innumerable mesenteric and peritoneal nodules. Lower omental caking visible with right paramidline omental nodule measuring 2.5 x 2.5 cm visible on 77/2. Another index omental nodule in the hepatic flexure measures 4.7 x 3.5 cm on 78/2. Omental nodule just deep to the umbilicus measures 5.4 x 3.2 cm on 85/2 and may involve the midline rectus sheath. Prominent peritoneal nodules are seen in the cul-de-sac and along the pelvic sidewall bilaterally. Musculoskeletal: No worrisome lytic or sclerotic osseous abnormality. IMPRESSION: 1. Markedly enlarged cervix and uterus with markedly expanded endometrium measuring up to 6.1 cm thickness. Imaging features highly suspicious for neoplasm and could be obstructive cervical carcinoma or primary endometrial lesion. 2. Moderate volume abdominopelvic ascites associated with innumerable mesenteric and peritoneal nodules, consistent with metastatic disease. 3. 2.7 cm heterogeneous lesion posterior right hepatic lobe, highly suspicious for metastatic disease. 4. Retroperitoneal and pelvic sidewall lymphadenopathy consistent with metastatic disease. 1.5 x 3.1 cm soft tissue nodule just deep to the anterior right first and second ribs, likely metastatic disease involving internal mammary lymph node. 5.  Aortic Atherosclerois (ICD10-170.0) Electronically Signed   By: Kennith Center M.D.   On: 04/20/2021 11:02   DG CHEST PORT 1 VIEW  Result Date:  04/28/2021 CLINICAL DATA:  Status post intubation.  NG tube placement. EXAM: PORTABLE CHEST 1 VIEW COMPARISON:  April 28, 2021 FINDINGS: The NG tube distal tip is in the distal esophagus. The side port is in the mid esophagus. The ETT is in good position. A right central line is  stable. The cardiomediastinal silhouette is stable. No pneumothorax. New platelike opacity in the right base. No other acute abnormalities. IMPRESSION: 1. The NG tube terminates in the stomach and requires advancement. Recommend advancing at least 12 cm. 2. Other support apparatus as above. 3. Haziness over the left chest likely due to patient rotation. 4. New platelike opacity in the right base favored represent atelectasis with developing infiltrate considered less likely. Recommend attention on follow-up. 5. No other abnormalities or changes. Electronically Signed   By: Gerome Sam III M.D.   On: 04/28/2021 20:45   DG CHEST PORT 1 VIEW  Result Date: 04/28/2021 CLINICAL DATA:  Central line placement. EXAM: PORTABLE CHEST 1 VIEW COMPARISON:  04/14/2021. FINDINGS: Right internal jugular dual lumen central venous catheter has been placed, tip projecting in the lower superior vena cava. No pneumothorax. Lungs remain clear. IMPRESSION: 1. Well-positioned right internal jugular dual lumen central venous catheter, tip in the lower superior vena cava. 2. No pneumothorax.  No acute cardiopulmonary disease. Electronically Signed   By: Amie Portland M.D.   On: 04/28/2021 10:30   DG Chest Portable 1 View  Result Date: 04/16/2021 CLINICAL DATA:  Kidney failure.  Denies chest complaints. EXAM: PORTABLE CHEST 1 VIEW COMPARISON:  CT chest dated April 19, 2021. Chest x-ray dated Sep 24, 2016. FINDINGS: The heart size and mediastinal contours are within normal limits. Both lungs are clear. Mild elevation of the right hemidiaphragm. The visualized skeletal structures are unremarkable. IMPRESSION: No active disease. Electronically Signed    By: Obie Dredge M.D.   On: 04/20/2021 16:02   DG Abd Portable 1V  Result Date: 04/28/2021 CLINICAL DATA:  Evaluate NG tube. EXAM: PORTABLE ABDOMEN - 1 VIEW COMPARISON:  April 28, 2021 FINDINGS: The side port of the NG tube is in the mid esophagus with the distal tip in the distal esophagus. Recommend advancing 13 cm. IMPRESSION: The NG tube terminates in the esophagus.  Recommend advancing 13 cm. Electronically Signed   By: Gerome Sam III M.D.   On: 04/28/2021 20:46   US PELVIC COMPLETE WITH TRANSVAGINAL  Result Date: 04/11/2021 CLINICAL DATA:  Post menopausal bleeding EXAM: TRANSABDOMINAL AND TRANSVAGINAL ULTRASOUND OF PELVIS TECHNIQUE: Both transabdominal and transvaginal ultrasound examinations of the pelvis were performed. Transabdominal technique was performed for global imaging of the pelvis including uterus, ovaries, adnexal regions, and pelvic cul-de-sac. It was necessary to proceed with endovaginal exam following the transabdominal exam to visualize the uterus and adnexa. COMPARISON:  None FINDINGS: Uterus Measurements: 8.6 x 3.7 x 5.5 cm = volume: 101 mL. Poorly visualized on both transabdominal and transvaginal images. Large heterogeneous pelvic mass measuring 18 x 16 x 19 cm. Mass is in the region of the uterine fundus but extends to the right adnexa and to the left of midline. Endometrium Unable to measure, poorly visualized Right ovary Not seen with certainty. Left ovary Not seen with certainty. Other findings Moderate free fluid within the pelvis with possible soft tissue nodules in the pelvic fluid. IMPRESSION: 1. Large heterogeneous pelvic mass measuring up to 19 cm concerning for neoplasm though exact origin of the mass (uterine versus adnexal) is indeterminate on this exam. Recommend gynecology consultation and further evaluation with pelvic MRI. 2. Moderate ascites in the pelvis with suspicion of soft tissue nodules within the ascites fluid. These results will be called to  the ordering clinician or representative by the Radiologist Assistant, and communication documented in the PACS or [employer]. Electronically Signed   By: Jasmine Pang  M.D.   On: 04/11/2021 16:20    Microbiology Recent Results (from the past 240 hour(s))  Resp Panel by RT-PCR (Flu A&B, Covid) Nasopharyngeal Swab     Status: None   Collection Time: 05-01-21  4:11 PM   Specimen: Nasopharyngeal Swab; Nasopharyngeal(NP) swabs in vial transport medium  Result Value Ref Range Status   SARS Coronavirus 2 by RT PCR NEGATIVE NEGATIVE Final    Comment: (NOTE) SARS-CoV-2 target nucleic acids are NOT DETECTED.  The SARS-CoV-2 RNA is generally detectable in upper respiratory specimens during the acute phase of infection. The lowest concentration of SARS-CoV-2 viral copies this assay can detect is 138 copies/mL. A negative result does not preclude SARS-Cov-2 infection and should not be used as the sole basis for treatment or other patient management decisions. A negative result may occur with  improper specimen collection/handling, submission of specimen other than nasopharyngeal swab, presence of viral mutation(s) within the areas targeted by this assay, and inadequate number of viral copies(<138 copies/mL). A negative result must be combined with clinical observations, patient history, and epidemiological information. The expected result is Negative.  Fact Sheet for Patients:  BloggerCourse.com  Fact Sheet for Healthcare Providers:  SeriousBroker.it  This test is no t yet approved or cleared by the [place] FDA and  has been authorized for detection and/or diagnosis of SARS-CoV-2 by FDA under an Emergency Use Authorization (EUA). This EUA will remain  in effect (meaning this test can be used) for the duration of the COVID-19 declaration under Section 564(b)(1) of the Act, 21 U.S.C.section 360bbb-3(b)(1), unless the authorization is  terminated  or revoked sooner.       Influenza A by PCR NEGATIVE NEGATIVE Final   Influenza B by PCR NEGATIVE NEGATIVE Final    Comment: (NOTE) The Xpert Xpress SARS-CoV-2/FLU/RSV plus assay is intended as an aid in the diagnosis of influenza from Nasopharyngeal swab specimens and should not be used as a sole basis for treatment. Nasal washings and aspirates are unacceptable for Xpert Xpress SARS-CoV-2/FLU/RSV testing.  Fact Sheet for Patients: BloggerCourse.com  Fact Sheet for Healthcare Providers: SeriousBroker.it  This test is not yet approved or cleared by the [place] FDA and has been authorized for detection and/or diagnosis of SARS-CoV-2 by FDA under an Emergency Use Authorization (EUA). This EUA will remain in effect (meaning this test can be used) for the duration of the COVID-19 declaration under Section 564(b)(1) of the Act, 21 U.S.C. section 360bbb-3(b)(1), unless the authorization is terminated or revoked.  Performed at [facility], [address], [place], [place] [address]   MRSA Next Gen by PCR, Nasal     Status: None   Collection Time: 05/01/2021  6:02 PM   Specimen: Nasal Mucosa; Nasal Swab  Result Value Ref Range Status   MRSA by PCR Next Gen NOT DETECTED NOT DETECTED Final    Comment: (NOTE) The GeneXpert MRSA Assay (FDA approved for NASAL specimens only), is one component of a comprehensive MRSA colonization surveillance program. It is not intended to diagnose MRSA infection nor to guide or monitor treatment for MRSA infections. Test performance is not FDA approved in patients less than 65 years old. Performed at [facility], [address], [place], [place] [address]     Lab Basic Metabolic Panel: Recent Labs  Lab 01-May-2021 1328 2021-05-01 1830 04/28/21 0253 04/28/21 0746 04/28/21 1131 04/28/21 1609 04/28/21 2041 05/01/2021 0030  NA 136  --  134*  --    --  132*  132*  --   K 6.9* 5.9* 6.9* 7.0* 7.1* 5.5*   5.5* 5.8* 6.2*  CL 97*  --  97*  --   --  96* 99  --   CO2 13*  --  17*  --   --  17* 13*  --   GLUCOSE 117*  --  124*  --   --  105* 195*  --   BUN 82*  --  88*  --   --  64* 62*  --   CREATININE 3.52*  --  3.72*  --   --  3.02* 3.34*  --   CALCIUM 9.9  --  8.5*  --   --  8.1* 7.8*  --   MG 2.6*  --   --   --   --   --   --   --   PHOS  --  6.9*  --   --   --  5.3*  --   --    Liver Function Tests: Recent Labs  Lab 05-01-21 1328 04/28/21 0253 04/28/21 1609  AST 121* 99*  --   ALT 24 25  --   ALKPHOS 132* 110  --   BILITOT 0.7 0.9  --   PROT 7.7 7.3  --   ALBUMIN 2.4* 2.5* 2.7*   No results for input(s): LIPASE, AMYLASE in the last 168 hours. No results for input(s): AMMONIA in the last 168 hours. CBC: Recent Labs  Lab 2021-05-01 1328 04/28/21 0253 04/28/21 2041  WBC 17.5* 14.7* 13.6*  NEUTROABS 12.5*  --   --   HGB 9.5* 8.7* 8.1*  HCT 29.1* 27.2* 27.0*  MCV 79.1* 81.0 85.7  PLT 229 203 175   Cardiac Enzymes: Recent Labs  Lab 2021/05/01 1830  CKTOTAL 204   Sepsis Labs: Recent Labs  Lab 05-01-2021 1328 05/01/2021 1611 05-01-2021 1830 04/28/21 0253 04/28/21 0746 04/28/21 1131 04/28/21 1609 04/28/21 2041  WBC 17.5*  --   --  14.7*  --   --   --  13.6*  LATICACIDVEN  --    < > >9.0*  --  6.7* 6.3* 5.7*  --    < > = values in this interval not displayed.     Noralee Stain 04/30/2021, 7:10 AM

## 2021-05-13 NOTE — Progress Notes (Signed)
Patient deceased. ETT removed by RN.

## 2021-05-13 NOTE — Progress Notes (Signed)
Pt was pronounced deceased by 2 RNs (Cathe Mons, RN & Bethanne Ginger, RN). No breath or heart sounds noted through auscultation. Family present and requested staff not to give pt information out to anyone other than himself [facility] [place], Spouse).

## 2021-05-13 NOTE — TOC CM/SW Note (Signed)
Transition of Care [facility]) Screening Note   Patient Details  Name: KADIJA URCIUOLI Date of Birth: Feb 05, 1966   Transition of Care [facility]) CM/SW Contact:    Darleene Cleaver, LCSW Phone Number: 05/04/2021, 9:46 AM    Transition of Care Department [facility]) has reviewed patient and no TOC needs have been identified at this time. We will continue to monitor patient advancement through interdisciplinary progression rounds. If new patient transition needs arise, please place a TOC consult.

## 2021-05-13 NOTE — Progress Notes (Addendum)
@   1923p I initiated conversation with the on call provider Jeannette Corpus, NP) regarding the size and feel of the pt belly, increase pain/discomfort of the pt & increased WOB d/t belly size. I asked for a KUB to investigate. After imaging was done, the pt began to get nauseous and vomited. At this time the head of the bed was raised however the pt aspirated. I could hear it throughout this lungs as the pt tried to breath. Pt became unresponsive, eyes glazed over and were fixed upward. I called for help from other floor RNs. I turned the O2 up to 15L, I stopped the CRRT @ 2005p. Pt remained unarousable. Code button was pushed @ 2010p. Another RN bagged pt while we waited for assistance to come. Pt remained in respiratory distress, flailing arms but not meaningfully communicating to staff, eyes still glazed over. R HD cath was heparin locked. Intubation was performed by anesthesia Tawni Millers, CRNA) w/o incident. Anesthesia dropped a L NG 48F and placed it on LIS. Placement of this tube was advanced twice 13cm then 10cm later in the shift.  AC Cecille Rubin, RN) called husband (reginald Correnti) @ 2016p. R radial Aline was placed @ 2100p. Pt was taken to STAT CT @ 2149p. Pt husband arrived to ED @ 2238p. Verbal order from M. Dinkels, MD to increase Levo limit to 47mcg @ 0008a (witnessed by genell G, RN). Pt husband agreed to transition pt to DNR status @ 0030a. Restraints were removed @ 0330a. Pt passed away @ 0430a. Pt was pronounced deceased by Cathe Mons, RN & Bethanne Ginger, RN.   Bethanne Ginger, RN

## 2021-05-13 DEATH — deceased

## 2022-02-20 ENCOUNTER — Other Ambulatory Visit (HOSPITAL_BASED_OUTPATIENT_CLINIC_OR_DEPARTMENT_OTHER): Payer: Self-pay

## 2022-02-28 ENCOUNTER — Ambulatory Visit: Payer: BC Managed Care – PPO | Admitting: Hematology and Oncology

## 2022-02-28 ENCOUNTER — Inpatient Hospital Stay: Payer: BC Managed Care – PPO
